# Patient Record
Sex: Male | Born: 1945 | Race: White | Hispanic: No | Smoking: Former smoker
Health system: Southern US, Community
[De-identification: ages and names within clinical notes are randomized; demographics above are authoritative.]

## PROBLEM LIST (undated history)

## (undated) DIAGNOSIS — I10 Essential (primary) hypertension: Secondary | ICD-10-CM

## (undated) DIAGNOSIS — E7800 Pure hypercholesterolemia, unspecified: Secondary | ICD-10-CM

## (undated) DIAGNOSIS — C12 Malignant neoplasm of pyriform sinus: Secondary | ICD-10-CM

## (undated) DIAGNOSIS — Z972 Presence of dental prosthetic device (complete) (partial): Secondary | ICD-10-CM

## (undated) HISTORY — DX: Malignant neoplasm of pyriform sinus: C12

## (undated) HISTORY — PX: COLONOSCOPY: SHX174

---

## 2010-07-22 ENCOUNTER — Emergency Department (HOSPITAL_COMMUNITY)
Admission: EM | Admit: 2010-07-22 | Discharge: 2010-07-22 | Disposition: A | Discharge status: Home / Self Care | Payer: MEDICARE | Attending: Emergency Medicine | Admitting: Emergency Medicine

## 2010-07-22 DIAGNOSIS — S90569A Insect bite (nonvenomous), unspecified ankle, initial encounter: Secondary | ICD-10-CM | POA: Insufficient documentation

## 2010-10-28 ENCOUNTER — Ambulatory Visit: Payer: Self-pay | Admitting: Gastroenterology

## 2010-11-01 LAB — PATHOLOGY REPORT

## 2011-10-02 DIAGNOSIS — I10 Essential (primary) hypertension: Secondary | ICD-10-CM | POA: Insufficient documentation

## 2011-10-02 DIAGNOSIS — E785 Hyperlipidemia, unspecified: Secondary | ICD-10-CM | POA: Insufficient documentation

## 2013-12-26 ENCOUNTER — Emergency Department (HOSPITAL_COMMUNITY)
Admission: EM | Admit: 2013-12-26 | Discharge: 2013-12-26 | Disposition: A | Discharge status: Home / Self Care | Payer: Medicare Other | Attending: Emergency Medicine | Admitting: Emergency Medicine

## 2013-12-26 ENCOUNTER — Emergency Department (HOSPITAL_COMMUNITY): Payer: Medicare Other

## 2013-12-26 ENCOUNTER — Encounter (HOSPITAL_COMMUNITY): Payer: Self-pay | Admitting: Emergency Medicine

## 2013-12-26 DIAGNOSIS — Z87891 Personal history of nicotine dependence: Secondary | ICD-10-CM | POA: Diagnosis not present

## 2013-12-26 DIAGNOSIS — E780 Pure hypercholesterolemia: Secondary | ICD-10-CM | POA: Insufficient documentation

## 2013-12-26 DIAGNOSIS — R001 Bradycardia, unspecified: Secondary | ICD-10-CM | POA: Insufficient documentation

## 2013-12-26 DIAGNOSIS — M19041 Primary osteoarthritis, right hand: Secondary | ICD-10-CM | POA: Diagnosis not present

## 2013-12-26 DIAGNOSIS — I10 Essential (primary) hypertension: Secondary | ICD-10-CM | POA: Insufficient documentation

## 2013-12-26 DIAGNOSIS — Z79899 Other long term (current) drug therapy: Secondary | ICD-10-CM | POA: Diagnosis not present

## 2013-12-26 DIAGNOSIS — M79641 Pain in right hand: Secondary | ICD-10-CM | POA: Diagnosis present

## 2013-12-26 HISTORY — DX: Pure hypercholesterolemia, unspecified: E78.00

## 2013-12-26 HISTORY — DX: Essential (primary) hypertension: I10

## 2013-12-26 MED ORDER — HYDROCODONE-ACETAMINOPHEN 5-325 MG PO TABS: 1.0000 | ORAL_TABLET | ORAL | Status: DC | PRN

## 2013-12-26 MED ORDER — CELECOXIB 100 MG PO CAPS: 100.0000 mg | ORAL_CAPSULE | Freq: Two times a day (BID) | ORAL | Status: DC

## 2013-12-26 NOTE — Discharge Instructions (Signed)
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 There is no fracture, or dislocation. Please apply heat or warm compress to your hand. Please use Celebrex 2 times daily with food. May use Norco for more severe pain if needed. Norco may cause drowsiness, please use with caution. Arthritis, Nonspecific Arthritis is pain, redness, warmth, or puffiness (inflammation) of a joint. The joint may be stiff or hurt when you move it. One or more joints may be affected. There are many types of arthritis. Your doctor may not know what type you have right away. The most common cause of arthritis is wear and tear on the joint (osteoarthritis). HOME CARE   Only take medicine as told by your doctor.  Rest the joint as much as possible.  Raise (elevate) your joint if it is puffy.  Use crutches if the painful joint is in your leg.  Drink enough fluids to keep your pee (urine) clear or pale yellow.  Follow your doctor's diet instructions.  Use cold packs for very bad joint pain for 10 to 15 minutes every hour. Ask your doctor if it is okay for you to use hot packs.  Exercise as told by your doctor.  Take a warm shower if you have stiffness in the morning.  Move your sore joints throughout the day. GET HELP RIGHT AWAY IF:   You have a fever.  You have very bad joint pain, puffiness, or redness.  You have many joints that are painful and puffy.  You are not getting better with treatment.  You have very bad back pain or leg weakness.  You cannot control when you poop (bowel movement) or pee (urinate).  You do not feel better in 24 hours or are getting worse.  You are having side effects from your medicine. MAKE SURE YOU:   Understand these instructions.  Will watch your condition.  Will get help right away if you are not doing well or get worse. Document Released: 05/24/2009 Document Revised: 08/29/2011 Document Reviewed: 05/24/2009 [facility] Patient Information 2015 [place],  [place]. This information is not intended to replace advice given to you by your health care provider. Make sure you discuss any questions you have with your health care provider.

## 2013-12-26 NOTE — ED Notes (Signed)
Rt hand swollen, onset last night. Superficial abrasion to hand,  Has abrasions to forearm also. 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

## 2013-12-26 NOTE — ED Notes (Signed)
Pt reports right hand pain since yesterday. 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 No obvious deformity noted. Radial pulses strong.

## 2013-12-26 NOTE — ED Provider Notes (Signed)
CSN: [id number]     Arrival date & time 12/26/13  1024 History   First MD Initiated Contact with Patient 12/26/13 1147     Chief Complaint  Patient presents with  . Hand Pain     (Consider location/radiation/quality/duration/timing/severity/associated sxs/prior Treatment) HPI Comments: The patient is a 68 year old male who presents to the emergency department with complaint of right hand pain. The patient states that he was working on taking a gas tank off of a car on yesterday and last night he began to notice pain. Today he noticed even more pain. He has pain moving his fingers. The most of the pain is at the fourth finger of the right hand. It is of note that the patient is right-hand dominant. He has not had any fever or chills. He does not remember hitting his hand, but states that moving a gas tank on his car it could have been that he may have hit and not know it. He's not had any previous operations or procedures involving the right hand.  Patient is a 68 y.o. male presenting with hand pain. The history is provided by the patient.  Hand Pain This is a new problem. The current episode started yesterday. Associated symptoms include arthralgias. Pertinent negatives include no abdominal pain, chest pain, coughing or neck pain.    Past Medical History  Diagnosis Date  . Hypertension   . High cholesterol    History reviewed. No pertinent past surgical history. History reviewed. No pertinent family history. History  Substance Use Topics  . Smoking status: Former Research scientist (life sciences)  . Smokeless tobacco: Not on file  . Alcohol Use: No    Review of Systems  Constitutional: Negative for activity change.       All ROS Neg except as noted in HPI  Eyes: Negative for photophobia and discharge.  Respiratory: Negative for cough, shortness of breath and wheezing.   Cardiovascular: Negative for chest pain and palpitations.  Gastrointestinal: Negative for abdominal pain and blood in stool.    Genitourinary: Negative for dysuria, frequency and hematuria.  Musculoskeletal: Positive for arthralgias. Negative for back pain and neck pain.  Skin: Negative.   Neurological: Negative for dizziness, seizures and speech difficulty.  Psychiatric/Behavioral: Negative for hallucinations and confusion.      Allergies  Review of patient's allergies indicates no known allergies.  Home Medications   Prior to Admission medications   Medication Sig Start Date End Date Taking? Authorizing Provider  lisinopril (PRINIVIL,ZESTRIL) 20 MG tablet Take 20 mg by mouth at bedtime. 02/26/12  Yes Historical Provider, MD  pravastatin (PRAVACHOL) 40 MG tablet Take 40 mg by mouth at bedtime.   Yes Historical Provider, MD   BP 172/95  Pulse 59  Temp(Src) 98.2 F (36.8 C) (Oral)  Resp 18  Ht 6' (1.829 m)  Wt 180 lb (81.647 kg)  BMI 24.41 kg/m2  SpO2 100% Physical Exam  Nursing note and vitals reviewed. Constitutional: He is oriented to person, place, and time. He appears well-developed and well-nourished.  Non-toxic appearance.  HENT:  Head: Normocephalic.  Right Ear: Tympanic membrane and external ear normal.  Left Ear: Tympanic membrane and external ear normal.  Eyes: EOM and lids are normal. Pupils are equal, round, and reactive to light.  Neck: Normal range of motion. Neck supple. Carotid bruit is not present.  Cardiovascular: Regular rhythm, normal heart sounds, intact distal pulses and normal pulses.  Bradycardia present.   Pulmonary/Chest: Breath sounds normal. No respiratory distress.  Abdominal: Soft. Bowel sounds are  normal. There is no tenderness. There is no guarding.  Musculoskeletal:       Right hand: He exhibits decreased range of motion, tenderness and swelling. He exhibits normal capillary refill and no deformity. Normal sensation noted.       Hands: Lymphadenopathy:       Head (right side): No submandibular adenopathy present.       Head (left side): No submandibular  adenopathy present.    He has no cervical adenopathy.  Neurological: He is alert and oriented to person, place, and time. He has normal strength. No cranial nerve deficit or sensory deficit.  Skin: Skin is warm and dry.  Psychiatric: He has a normal mood and affect. His speech is normal.    ED Course  Procedures (including critical care time) Labs Review Labs Reviewed - No data to display  Imaging Review Dg Hand Complete Right  12/26/2013   CLINICAL DATA:  Right hand redness and pain. No injury. Initial evaluation .  EXAM: RIGHT HAND - COMPLETE 3+ VIEW  COMPARISON:  None.  FINDINGS: No acute soft tissue or bony abnormality noted. Diffuse degenerative change. No radiopaque foreign body .  IMPRESSION: No acute abnormality.   Electronically Signed   By: Marcello Moores  Register   On: 12/26/2013 12:00     EKG Interpretation None      MDM  There no hot joints noted of the right hand, wrist, or elbow. There is no red streaks or signs of cellulitis. X-ray of the right hand is negative for any acute fracture or dislocation.  Prescription for Celebrex and Norco given to the patient. Patient to follow with primary care physician next week.    Final diagnoses:  None    *I have reviewed nursing notes, vital signs, and all appropriate lab and imaging results for this patient.Lenox Ahr, PA-C 12/26/13 1255

## 2013-12-31 NOTE — ED Provider Notes (Signed)
Medical screening examination/treatment/procedure(s) were performed by non-physician practitioner and as supervising physician I was immediately available for consultation/collaboration.   EKG Interpretation None        Tanna Furry, MD 12/31/13 2327

## 2020-05-10 NOTE — H&P (Signed)
Surgical History & Physical  Patient Name: Travis Arellano DOB: 03-May-1945  Surgery: Cataract extraction with intraocular lens implant phacoemulsification; Right Eye  Surgeon: Baruch Goldmann MD Surgery Date:  05/17/2020 Pre-Op Date:  05/03/2020  HPI: A 27 Yr. old male patient Pt referred by Dr. Jorja Loa for cataract evaluation. The patient complains of difficulty when viewing TV, reading closed caption, news scrolls on TV, which began 2 years ago. Both eyes are affected but OD seems worse than OS. The condition's severity is worsening. Pt does not use any correction. The complaint is associated with glare, bothersome tearing OS and halos. Symptoms are negatively affecting pt's quality of life. The patient experiences no increase in flashes, floater, shadow, curtain or veil. HPI was performed by Baruch Goldmann .  Medical History: Dry Eyes Cataracts High Blood Pressure Hypercholesterolemia  Review of Systems Negative Allergic/Immunologic Negative Cardiovascular Negative Constitutional Negative Ear, Nose, Mouth & Throat Negative Endocrine Negative Eyes Negative Gastrointestinal Negative Genitourinary Negative Hemotologic/Lymphatic Negative Integumentary Negative Musculoskeletal Negative Neurological Negative Psychiatry Negative Respiratory  Social   Never smoked   Medication Systane Complete,  PRAVASTATIN SODIUM, LISINOPRIL, METOPROLOL ER, PRAVASTATIN, DOXAZOSIN MESYLATE, HYDROCHLOROTHIAZIDE,   Sx/Procedures  None  Drug Allergies   NKDA  History & Physical: Heent:  Cataract, Right eye NECK: supple without bruits LUNGS: lungs clear to auscultation CV: regular rate and rhythm Abdomen: soft and non-tender  Impression & Plan: Assessment: 1.  NUCLEAR SCLEROSIS AGE RELATED; Both Eyes (H25.13) 2.  COMBINED FORMS AGE RELATED CATARACT; Left Eye (H25.812) 3.  BLEPHARITIS; Right Upper Lid, Right Lower Lid, Left Upper Lid, Left Lower Lid (H01.001, H01.002,H01.004,H01.005) 4.   Pinguecula; Both Eyes (H11.153) 5.  DERMATOCHALASIS; Right Upper Lid, Left Upper Lid (H02.831, D17.616)  Plan: 1.  Cataract accounts for the patient's decreased vision. This visual impairment is not correctable with a tolerable change in glasses or contact lenses. Cataract surgery with an implantation of a new lens should significantly improve the visual and functional status of the patient. Discussed all risks, benefits, alternatives, and potential complications. Discussed the procedures and recovery. Patient desires to have surgery. A-scan ordered and performed today for intra-ocular lens calculations. The surgery will be performed in order to improve vision for driving, reading, and for eye examinations. Recommend phacoemulsification with intra-ocular lens. Recommend Dextenza for post-operative pain and inflammation. Right Eye first. Dilates well - shugarcaine by protocol. Set for Near : goal -2.00 2.  Significant - will address after OD. 3.  Regular lid cleaning. 4.  Observe; Artificial tears as needed for irritation. 5.  Asymptomatic, recommend observation for now. Findings, prognosis and treatment options reviewed.

## 2020-05-13 ENCOUNTER — Encounter (HOSPITAL_COMMUNITY)
Admission: RE | Admit: 2020-05-13 | Discharge: 2020-05-13 | Disposition: A | Discharge status: Home / Self Care | Payer: Medicare Other | Source: Ambulatory Visit | Attending: Ophthalmology | Admitting: Ophthalmology

## 2020-05-13 ENCOUNTER — Other Ambulatory Visit: Payer: Self-pay

## 2020-05-13 ENCOUNTER — Encounter (HOSPITAL_COMMUNITY): Payer: Self-pay | Admitting: Ophthalmology

## 2020-05-14 ENCOUNTER — Other Ambulatory Visit (HOSPITAL_COMMUNITY)
Admission: RE | Admit: 2020-05-14 | Discharge: 2020-05-14 | Disposition: A | Discharge status: Home / Self Care | Payer: Medicare Other | Source: Ambulatory Visit | Attending: Ophthalmology | Admitting: Ophthalmology

## 2020-05-14 ENCOUNTER — Other Ambulatory Visit: Payer: Self-pay

## 2020-05-14 DIAGNOSIS — Z20822 Contact with and (suspected) exposure to covid-19: Secondary | ICD-10-CM | POA: Insufficient documentation

## 2020-05-14 DIAGNOSIS — Z01812 Encounter for preprocedural laboratory examination: Secondary | ICD-10-CM | POA: Diagnosis present

## 2020-05-15 LAB — SARS CORONAVIRUS 2 (TAT 6-24 HRS): SARS Coronavirus 2: NEGATIVE

## 2020-05-17 ENCOUNTER — Encounter (HOSPITAL_COMMUNITY)
Admission: RE | Disposition: A | Discharge status: Home / Self Care | Payer: Self-pay | Source: Home / Self Care | Attending: Ophthalmology

## 2020-05-17 ENCOUNTER — Ambulatory Visit (HOSPITAL_COMMUNITY)
Admission: RE | Admit: 2020-05-17 | Discharge: 2020-05-17 | Disposition: A | Discharge status: Home / Self Care | Payer: Medicare Other | Attending: Ophthalmology | Admitting: Ophthalmology

## 2020-05-17 ENCOUNTER — Encounter (HOSPITAL_COMMUNITY): Payer: Self-pay | Admitting: Ophthalmology

## 2020-05-17 ENCOUNTER — Ambulatory Visit (HOSPITAL_COMMUNITY): Payer: Medicare Other | Admitting: Anesthesiology

## 2020-05-17 DIAGNOSIS — H02831 Dermatochalasis of right upper eyelid: Secondary | ICD-10-CM | POA: Insufficient documentation

## 2020-05-17 DIAGNOSIS — Z79899 Other long term (current) drug therapy: Secondary | ICD-10-CM | POA: Diagnosis not present

## 2020-05-17 DIAGNOSIS — H11153 Pinguecula, bilateral: Secondary | ICD-10-CM | POA: Insufficient documentation

## 2020-05-17 DIAGNOSIS — H2511 Age-related nuclear cataract, right eye: Secondary | ICD-10-CM | POA: Insufficient documentation

## 2020-05-17 DIAGNOSIS — H0100A Unspecified blepharitis right eye, upper and lower eyelids: Secondary | ICD-10-CM | POA: Insufficient documentation

## 2020-05-17 DIAGNOSIS — H02834 Dermatochalasis of left upper eyelid: Secondary | ICD-10-CM | POA: Insufficient documentation

## 2020-05-17 DIAGNOSIS — H0100B Unspecified blepharitis left eye, upper and lower eyelids: Secondary | ICD-10-CM | POA: Insufficient documentation

## 2020-05-17 HISTORY — PX: CATARACT EXTRACTION W/PHACO: SHX586

## 2020-05-17 SURGERY — PHACOEMULSIFICATION, CATARACT, WITH IOL INSERTION
Anesthesia: Monitor Anesthesia Care | Site: Eye | Laterality: Right

## 2020-05-17 MED ORDER — LIDOCAINE HCL 3.5 % OP GEL
1.0000 "application " | Freq: Once | OPHTHALMIC | Status: AC
  Administered 2020-05-17: 1 via OPHTHALMIC

## 2020-05-17 MED ORDER — POVIDONE-IODINE 5 % OP SOLN
OPHTHALMIC | Status: DC | PRN
  Administered 2020-05-17: 1 via OPHTHALMIC

## 2020-05-17 MED ORDER — LIDOCAINE HCL (PF) 1 % IJ SOLN
INTRAOCULAR | Status: DC | PRN
  Administered 2020-05-17: 1 mL via OPHTHALMIC

## 2020-05-17 MED ORDER — NEOMYCIN-POLYMYXIN-DEXAMETH 3.5-10000-0.1 OP SUSP
OPHTHALMIC | Status: DC | PRN
  Administered 2020-05-17: 1 [drp] via OPHTHALMIC

## 2020-05-17 MED ORDER — PROVISC 10 MG/ML IO SOLN
INTRAOCULAR | Status: DC | PRN
  Administered 2020-05-17: 0.85 mL via INTRAOCULAR

## 2020-05-17 MED ORDER — BSS IO SOLN
INTRAOCULAR | Status: DC | PRN
  Administered 2020-05-17: 15 mL via INTRAOCULAR

## 2020-05-17 MED ORDER — STERILE WATER FOR IRRIGATION IR SOLN
Status: DC | PRN
  Administered 2020-05-17: 250 mL

## 2020-05-17 MED ORDER — TETRACAINE HCL 0.5 % OP SOLN
1.0000 [drp] | OPHTHALMIC | Status: AC | PRN
  Administered 2020-05-17 (×3): 1 [drp] via OPHTHALMIC

## 2020-05-17 MED ORDER — EPINEPHRINE PF 1 MG/ML IJ SOLN
INTRAOCULAR | Status: DC | PRN
  Administered 2020-05-17: 500 mL

## 2020-05-17 MED ORDER — SODIUM HYALURONATE 23 MG/ML IO SOLN
INTRAOCULAR | Status: DC | PRN
  Administered 2020-05-17: 0.6 mL via INTRAOCULAR

## 2020-05-17 MED ORDER — PHENYLEPHRINE HCL 2.5 % OP SOLN
1.0000 [drp] | OPHTHALMIC | Status: AC | PRN
  Administered 2020-05-17 (×3): 1 [drp] via OPHTHALMIC

## 2020-05-17 MED ORDER — TROPICAMIDE 1 % OP SOLN
1.0000 [drp] | OPHTHALMIC | Status: AC
  Administered 2020-05-17 (×3): 1 [drp] via OPHTHALMIC

## 2020-05-17 SURGICAL SUPPLY — 12 items
CLOTH BEACON ORANGE TIMEOUT ST (SAFETY) ×2 IMPLANT
EYE SHIELD UNIVERSAL CLEAR (GAUZE/BANDAGES/DRESSINGS) ×2 IMPLANT
GLOVE SURG UNDER POLY LF SZ6.5 (GLOVE) ×2 IMPLANT
GLOVE SURG UNDER POLY LF SZ7 (GLOVE) ×2 IMPLANT
NEEDLE HYPO 18GX1.5 BLUNT FILL (NEEDLE) ×2 IMPLANT
PAD ARMBOARD 7.5X6 YLW CONV (MISCELLANEOUS) ×2 IMPLANT
SYR TB 1ML LL NO SAFETY (SYRINGE) ×2 IMPLANT
TAPE SURG TRANSPORE 1 IN (GAUZE/BANDAGES/DRESSINGS) ×1 IMPLANT
TAPE SURGICAL TRANSPORE 1 IN (GAUZE/BANDAGES/DRESSINGS) ×2
TECNIS IOL (Intraocular Lens) ×2 IMPLANT
VISCOELASTIC ADDITIONAL (OPHTHALMIC RELATED) IMPLANT
WATER STERILE IRR 250ML POUR (IV SOLUTION) ×2 IMPLANT

## 2020-05-17 NOTE — Anesthesia Procedure Notes (Signed)
Procedure Name: MAC Date/Time: 05/17/2020 2:21 PM Performed by: Orlie Dakin, CRNA Pre-anesthesia Checklist: Patient identified, Emergency Drugs available, Suction available and Patient being monitored Patient Re-evaluated:Patient Re-evaluated prior to induction Oxygen Delivery Method: Nasal cannula Placement Confirmation: positive ETCO2

## 2020-05-17 NOTE — Discharge Instructions (Signed)
Please discharge patient when stable, will follow up today with Dr. Korene Dula at the [facility] [place] office immediately following discharge.  Leave shield in place until visit.  All paperwork with discharge instructions will be given at the office.  [name] Address:  [address]  [place], [place] [address]  

## 2020-05-17 NOTE — Op Note (Signed)
Date of procedure: 05/17/20  Pre-operative diagnosis: Visually significant age-related nuclear cataract, Right Eye (H25.11)  Post-operative diagnosis: Visually significant age-related nuclear cataract, Right Eye  Procedure: Removal of cataract via phacoemulsification and insertion of intra-ocular lens [employer] DCB00  +23.5D into the capsular bag of the Right Eye  Attending surgeon: Raoul A. Wrzosek, MD, MA  Anesthesia: MAC, Topical Akten  Complications: None  Estimated Blood Loss: <1mL (minimal)  Specimens: None  Implants: As above  Indications:  Visually significant age-related cataract, Right Eye  Procedure:  The patient was seen and identified in the pre-operative area. The operative eye was identified and dilated.  The operative eye was marked.  Topical anesthesia was administered to the operative eye.     The patient was then to the operative suite and placed in the supine position.  A timeout was performed confirming the patient, procedure to be performed, and all other relevant information.   The patient's face was prepped and draped in the usual fashion for intra-ocular surgery.  A lid speculum was placed into the operative eye and the surgical microscope moved into place and focused.  A superotemporal paracentesis was created using a 20 gauge paracentesis blade.  Shugarcaine was injected into the anterior chamber.  Viscoelastic was injected into the anterior chamber.  A temporal clear-corneal main wound incision was created using a 2.4mm microkeratome.  A continuous curvilinear capsulorrhexis was initiated using an irrigating cystitome and completed using capsulorrhexis forceps.  Hydrodissection and hydrodeliniation were performed.  Viscoelastic was injected into the anterior chamber.  A phacoemulsification handpiece and a chopper as a second instrument were used to remove the nucleus and epinucleus. The irrigation/aspiration handpiece was used to remove any  remaining cortical material.   The capsular bag was reinflated with viscoelastic, checked, and found to be intact.  The intraocular lens was inserted into the capsular bag.  The irrigation/aspiration handpiece was used to remove any remaining viscoelastic.  The clear corneal wound and paracentesis wounds were then hydrated and checked with Weck-Cels to be watertight.  The lid-speculum and drape was removed, and the patient's face was cleaned with a wet and dry 4x4.  Maxitrol was instilled in the eye before a clear shield was taped over the eye. The patient was taken to the post-operative care unit in good condition, having tolerated the procedure well.  Post-Op Instructions: The patient will follow up at [facility] for a same day post-operative evaluation and will receive all other orders and instructions.  

## 2020-05-17 NOTE — Anesthesia Preprocedure Evaluation (Addendum)
Anesthesia Evaluation  Patient identified by MRN, date of birth, ID band Patient awake    Reviewed: Allergy & Precautions, NPO status , Patient's Chart, lab work & pertinent test results  History of Anesthesia Complications Negative for: history of anesthetic complications  Airway Mallampati: III  TM Distance: >3 FB Neck ROM: Full    Dental  (+) Dental Advisory Given, Upper Dentures, Partial Lower   Pulmonary former smoker,    Pulmonary exam normal breath sounds clear to auscultation       Cardiovascular Exercise Tolerance: Good hypertension, Pt. on medications Normal cardiovascular exam Rhythm:Regular Rate:Normal     Neuro/Psych negative psych ROS   GI/Hepatic negative GI ROS, Neg liver ROS,   Endo/Other  negative endocrine ROS  Renal/GU negative Renal ROS     Musculoskeletal negative musculoskeletal ROS (+)   Abdominal   Peds  Hematology negative hematology ROS (+)   Anesthesia Other Findings   Reproductive/Obstetrics negative OB ROS                             Anesthesia Physical  Anesthesia Plan  ASA: II  Anesthesia Plan: MAC   Post-op Pain Management:    Induction:   PONV Risk Score and Plan:   Airway Management Planned: Nasal Cannula and Natural Airway  Additional Equipment:   Intra-op Plan:   Post-operative Plan:   Informed Consent: I have reviewed the patients History and Physical, chart, labs and discussed the procedure including the risks, benefits and alternatives for the proposed anesthesia with the patient or authorized representative who has indicated his/her understanding and acceptance.     Dental advisory given  Plan Discussed with: CRNA and Surgeon  Anesthesia Plan Comments:         Anesthesia Quick Evaluation  

## 2020-05-17 NOTE — Transfer of Care (Signed)
Immediate Anesthesia Transfer of Care Note  Patient: Travis Arellano  Procedure(s) Performed: CATARACT EXTRACTION PHACO AND INTRAOCULAR LENS PLACEMENT (IOC) (Right Eye)  Patient Location: Short Stay  Anesthesia Type:MAC  Level of Consciousness: awake, alert  and oriented  Airway & Oxygen Therapy: Patient Spontanous Breathing  Post-op Assessment: Report given to RN and Post -op Vital signs reviewed and stable  Post vital signs: Reviewed and stable  Last Vitals:  Vitals Value Taken Time  BP    Temp    Pulse    Resp    SpO2      Last Pain:  Vitals:   05/17/20 1311  TempSrc: Oral  PainSc: 0-No pain      Patients Stated Pain Goal: 4 (76/80/88 1103)  Complications: No complications documented.

## 2020-05-17 NOTE — Interval H&P Note (Signed)
History and Physical Interval Note:  05/17/2020 2:14 PM  Travis Arellano  has presented today for surgery, with the diagnosis of Nuclear sclerotic cataract - Right eye.  The various methods of treatment have been discussed with the patient and family. After consideration of risks, benefits and other options for treatment, the patient has consented to  Procedure(s) with comments: CATARACT EXTRACTION PHACO AND INTRAOCULAR LENS PLACEMENT (IOC) (Right) - CDE:  as a surgical intervention.  The patient's history has been reviewed, patient examined, no change in status, stable for surgery.  I have reviewed the patient's chart and labs.  Questions were answered to the patient's satisfaction.     Baruch Goldmann

## 2020-05-17 NOTE — Anesthesia Postprocedure Evaluation (Signed)
Anesthesia Post Note  Patient: Travis Arellano  Procedure(s) Performed: CATARACT EXTRACTION PHACO AND INTRAOCULAR LENS PLACEMENT (IOC) (Right Eye)  Patient location during evaluation: Phase II Anesthesia Type: MAC Level of consciousness: awake and alert and oriented Pain management: pain level controlled Vital Signs Assessment: post-procedure vital signs reviewed and stable Respiratory status: spontaneous breathing, nonlabored ventilation and respiratory function stable Cardiovascular status: blood pressure returned to baseline and stable Postop Assessment: no apparent nausea or vomiting Anesthetic complications: no   No complications documented.   Last Vitals:  Vitals:   05/17/20 1311 05/17/20 1435  BP: 135/75 135/81  Pulse: (!) 54 (!) 54  Resp: 18 18  Temp: 36.6 C 36.6 C  SpO2: 100% 97%    Last Pain:  Vitals:   05/17/20 1435  TempSrc: Oral  PainSc: 0-No pain                 Orlie Dakin

## 2020-05-18 ENCOUNTER — Encounter (HOSPITAL_COMMUNITY): Payer: Self-pay | Admitting: Ophthalmology

## 2020-06-04 NOTE — H&P (Signed)
Surgical History & Physical  Patient Name: Travis Arellano DOB: August 19, 1945  Surgery: Cataract extraction with intraocular lens implant phacoemulsification; Left Eye  Surgeon: Baruch Goldmann MD Surgery Date:  06/11/2020 Pre-Op Date:  05/31/2020  HPI: A 36 Yr. old male patient 1. The patient complains of difficulty when viewing TV, reading closed caption, news scrolls on TV, which began 2 years ago. The left eye is affected. The episode is gradual. The condition's severity increased since last visit. Symptoms occur when the patient is inside and outside. This is negatively affecting his quality of life. 2. The patient is returning after cataract post-op. The right eye is affected. Status post cataract post-op, which began 1 year ago: Since the last visit, the affected area is doing well. The patient's vision is improved. Patient is following medication instructions. HPI was performed by Baruch Goldmann .  Medical History: Dry Eyes Cataracts High Blood Pressure Hypercholesterolemia  Review of Systems Negative Allergic/Immunologic Negative Cardiovascular Negative Constitutional Negative Ear, Nose, Mouth & Throat Negative Endocrine Negative Eyes Negative Gastrointestinal Negative Genitourinary Negative Hemotologic/Lymphatic Negative Integumentary Negative Musculoskeletal Negative Neurological Negative Psychiatry Negative Respiratory  Social   Never smoked  Medication Systane Complete, Ilevro, Moxifloxacin, Prednisolone acetate 1%,  PRAVASTATIN SODIUM, LISINOPRIL, METOPROLOL ER, PRAVASTATIN, DOXAZOSIN MESYLATE, HYDROCHLOROTHIAZIDE,   Sx/Procedures Phaco c IOL OD,   Drug Allergies   NKDA  History & Physical: Heent:  Cataract, Left eye NECK: supple without bruits LUNGS: lungs clear to auscultation CV: regular rate and rhythm Abdomen: soft and non-tender  Impression & Plan: Assessment: 1.  CATARACT EXTRACTION STATUS; Right Eye (Z98.41) 2.  INTRAOCULAR LENS IOL  (Z96.1) 3.  COMBINED FORMS AGE RELATED CATARACT; Left Eye (H25.812)  Plan: 1.  1 week after cataract surgery. Doing well with improved vision and normal eye pressure. Call with any problems or concerns. Stop Vigamox. Continue Ilevro 1 drop 1x/day for 3 more weeks. Continue Pred Acetate 1 drop 2x/day for 3 more weeks. 2.  Doing well since surgery Continue Post-op medications 3.  Cataract accounts for the patient's decreased vision. This visual impairment is not correctable with a tolerable change in glasses or contact lenses. Cataract surgery with an implantation of a new lens should significantly improve the visual and functional status of the patient. Discussed all risks, benefits, alternatives, and potential complications. Discussed the procedures and recovery. Patient desires to have surgery. A-scan ordered and performed today for intra-ocular lens calculations. The surgery will be performed in order to improve vision for driving, reading, and for eye examinations. Recommend phacoemulsification with intra-ocular lens. Recommend Dextenza for post-operative pain and inflammation. Left Eye. Surgery required to correct imbalance of vision. Dilates well - shugarcaine by protocol.

## 2020-06-08 ENCOUNTER — Other Ambulatory Visit: Payer: Self-pay

## 2020-06-08 ENCOUNTER — Encounter (HOSPITAL_COMMUNITY)
Admission: RE | Admit: 2020-06-08 | Discharge: 2020-06-08 | Disposition: A | Discharge status: Home / Self Care | Payer: Medicare Other | Source: Ambulatory Visit | Attending: Ophthalmology | Admitting: Ophthalmology

## 2020-06-08 ENCOUNTER — Encounter (HOSPITAL_COMMUNITY): Payer: Self-pay

## 2020-06-09 ENCOUNTER — Other Ambulatory Visit: Payer: Self-pay

## 2020-06-09 ENCOUNTER — Other Ambulatory Visit (HOSPITAL_COMMUNITY)
Admission: RE | Admit: 2020-06-09 | Discharge: 2020-06-09 | Disposition: A | Discharge status: Home / Self Care | Payer: Medicare Other | Source: Ambulatory Visit | Attending: Ophthalmology | Admitting: Ophthalmology

## 2020-06-09 DIAGNOSIS — Z01812 Encounter for preprocedural laboratory examination: Secondary | ICD-10-CM | POA: Diagnosis present

## 2020-06-09 DIAGNOSIS — Z20822 Contact with and (suspected) exposure to covid-19: Secondary | ICD-10-CM | POA: Diagnosis not present

## 2020-06-09 LAB — SARS CORONAVIRUS 2 (TAT 6-24 HRS): SARS Coronavirus 2: NEGATIVE

## 2020-06-11 ENCOUNTER — Encounter (HOSPITAL_COMMUNITY)
Admission: RE | Disposition: A | Discharge status: Home / Self Care | Payer: Self-pay | Source: Home / Self Care | Attending: Ophthalmology

## 2020-06-11 ENCOUNTER — Ambulatory Visit (HOSPITAL_COMMUNITY)
Admission: RE | Admit: 2020-06-11 | Discharge: 2020-06-11 | Disposition: A | Discharge status: Home / Self Care | Payer: Medicare Other | Attending: Ophthalmology | Admitting: Ophthalmology

## 2020-06-11 ENCOUNTER — Ambulatory Visit (HOSPITAL_COMMUNITY): Payer: Medicare Other | Admitting: Anesthesiology

## 2020-06-11 DIAGNOSIS — Z9841 Cataract extraction status, right eye: Secondary | ICD-10-CM | POA: Diagnosis not present

## 2020-06-11 DIAGNOSIS — Z961 Presence of intraocular lens: Secondary | ICD-10-CM | POA: Diagnosis not present

## 2020-06-11 DIAGNOSIS — H25812 Combined forms of age-related cataract, left eye: Secondary | ICD-10-CM | POA: Diagnosis present

## 2020-06-11 DIAGNOSIS — Z87891 Personal history of nicotine dependence: Secondary | ICD-10-CM | POA: Diagnosis not present

## 2020-06-11 DIAGNOSIS — Z79899 Other long term (current) drug therapy: Secondary | ICD-10-CM | POA: Diagnosis not present

## 2020-06-11 HISTORY — PX: CATARACT EXTRACTION W/PHACO: SHX586

## 2020-06-11 SURGERY — PHACOEMULSIFICATION, CATARACT, WITH IOL INSERTION
Anesthesia: Monitor Anesthesia Care | Site: Eye | Laterality: Left

## 2020-06-11 MED ORDER — NEOMYCIN-POLYMYXIN-DEXAMETH 3.5-10000-0.1 OP SUSP
OPHTHALMIC | Status: DC | PRN
  Administered 2020-06-11: 1 [drp] via OPHTHALMIC

## 2020-06-11 MED ORDER — LIDOCAINE HCL (PF) 1 % IJ SOLN
INTRAOCULAR | Status: DC | PRN
  Administered 2020-06-11: 1 mL via OPHTHALMIC

## 2020-06-11 MED ORDER — PHENYLEPHRINE HCL 2.5 % OP SOLN
1.0000 [drp] | OPHTHALMIC | Status: AC | PRN
  Administered 2020-06-11 (×3): 1 [drp] via OPHTHALMIC

## 2020-06-11 MED ORDER — PROVISC 10 MG/ML IO SOLN
INTRAOCULAR | Status: DC | PRN
  Administered 2020-06-11: 0.85 mL via INTRAOCULAR

## 2020-06-11 MED ORDER — EPINEPHRINE PF 1 MG/ML IJ SOLN
INTRAMUSCULAR | Status: AC
  Filled 2020-06-11: qty 2

## 2020-06-11 MED ORDER — POVIDONE-IODINE 5 % OP SOLN
OPHTHALMIC | Status: DC | PRN
  Administered 2020-06-11: 1 via OPHTHALMIC

## 2020-06-11 MED ORDER — EPINEPHRINE PF 1 MG/ML IJ SOLN
INTRAOCULAR | Status: DC | PRN
  Administered 2020-06-11: 500 mL

## 2020-06-11 MED ORDER — TROPICAMIDE 1 % OP SOLN
1.0000 [drp] | OPHTHALMIC | Status: AC
Start: 2020-06-11 — End: 2020-06-11
  Administered 2020-06-11 (×3): 1 [drp] via OPHTHALMIC

## 2020-06-11 MED ORDER — SODIUM HYALURONATE 23 MG/ML IO SOLN
INTRAOCULAR | Status: DC | PRN
  Administered 2020-06-11: 0.6 mL via INTRAOCULAR

## 2020-06-11 MED ORDER — STERILE WATER FOR IRRIGATION IR SOLN
Status: DC | PRN
  Administered 2020-06-11: 500 mL

## 2020-06-11 MED ORDER — TETRACAINE HCL 0.5 % OP SOLN
1.0000 [drp] | OPHTHALMIC | Status: AC | PRN
  Administered 2020-06-11 (×3): 1 [drp] via OPHTHALMIC

## 2020-06-11 MED ORDER — BSS IO SOLN
INTRAOCULAR | Status: DC | PRN
  Administered 2020-06-11: 15 mL via INTRAOCULAR

## 2020-06-11 MED ORDER — LIDOCAINE HCL 3.5 % OP GEL
1.0000 "application " | Freq: Once | OPHTHALMIC | Status: AC
  Administered 2020-06-11: 1 via OPHTHALMIC

## 2020-06-11 SURGICAL SUPPLY — 12 items
CLOTH BEACON ORANGE TIMEOUT ST (SAFETY) ×2 IMPLANT
EYE SHIELD UNIVERSAL CLEAR (GAUZE/BANDAGES/DRESSINGS) ×2 IMPLANT
GLOVE SURG UNDER POLY LF SZ6.5 (GLOVE) ×2 IMPLANT
GLOVE SURG UNDER POLY LF SZ7 (GLOVE) ×2 IMPLANT
NEEDLE HYPO 18GX1.5 BLUNT FILL (NEEDLE) ×2 IMPLANT
PAD ARMBOARD 7.5X6 YLW CONV (MISCELLANEOUS) ×2 IMPLANT
SYR TB 1ML LL NO SAFETY (SYRINGE) ×2 IMPLANT
TAPE SURG TRANSPORE 1 IN (GAUZE/BANDAGES/DRESSINGS) ×1 IMPLANT
TAPE SURGICAL TRANSPORE 1 IN (GAUZE/BANDAGES/DRESSINGS) ×2
TECNIS IOL (Intraocular Lens) ×2 IMPLANT
VISCOELASTIC ADDITIONAL (OPHTHALMIC RELATED) IMPLANT
WATER STERILE IRR 250ML POUR (IV SOLUTION) ×2 IMPLANT

## 2020-06-11 NOTE — Interval H&P Note (Signed)
History and Physical Interval Note:  06/11/2020 1:25 PM  Lenda Kelp  has presented today for surgery, with the diagnosis of Nuclear sclerotic cataract - Left eye.  The various methods of treatment have been discussed with the patient and family. After consideration of risks, benefits and other options for treatment, the patient has consented to  Procedure(s) with comments: CATARACT EXTRACTION PHACO AND INTRAOCULAR LENS PLACEMENT (Alpha) (Left) - left as a surgical intervention.  The patient's history has been reviewed, patient examined, no change in status, stable for surgery.  I have reviewed the patient's chart and labs.  Questions were answered to the patient's satisfaction.     Baruch Goldmann

## 2020-06-11 NOTE — Transfer of Care (Signed)
Immediate Anesthesia Transfer of Care Note  Patient: Travis Arellano  Procedure(s) Performed: CATARACT EXTRACTION PHACO AND INTRAOCULAR LENS PLACEMENT (IOC) (Left Eye)  Patient Location: Short Stay  Anesthesia Type:MAC  Level of Consciousness: awake, alert  and oriented  Airway & Oxygen Therapy: Patient Spontanous Breathing  Post-op Assessment: Report given to RN and Post -op Vital signs reviewed and stable  Post vital signs: Reviewed and stable  Last Vitals:  Vitals Value Taken Time  BP    Temp    Pulse    Resp    SpO2      Last Pain:  Vitals:   06/11/20 1315  PainSc: 0-No pain         Complications: No complications documented.

## 2020-06-11 NOTE — Op Note (Signed)
Date of procedure: 06/11/20  Pre-operative diagnosis: Visually significant age-related combined cataract, Left Eye (H25.812)  Post-operative diagnosis: Visually significant age-related combined cataract, Left Eye (H25.812)  Procedure: Removal of cataract via phacoemulsification and insertion of intra-ocular lens [place]  +21.0D into the capsular bag of the Left Eye  Attending surgeon: Gerda Diss. Kanan Sobek, MD, MA  Anesthesia: MAC, Topical [place]  Complications: None  Estimated Blood Loss: <31m (minimal)  Specimens: None  Implants: As above  Indications:  Visually significant age-related cataract, Left Eye  Procedure:  The patient was seen and identified in the pre-operative area. The operative eye was identified and dilated.  The operative eye was marked.  Topical anesthesia was administered to the operative eye.     The patient was then to the operative suite and placed in the supine position.  A timeout was performed confirming the patient, procedure to be performed, and all other relevant information.   The patient's face was prepped and draped in the usual fashion for intra-ocular surgery.  A lid speculum was placed into the operative eye and the surgical microscope moved into place and focused.  An inferotemporal paracentesis was created using a 20 gauge paracentesis blade.  Shugarcaine was injected into the anterior chamber.  Viscoelastic was injected into the anterior chamber.  A temporal clear-corneal main wound incision was created using a 2.476mmicrokeratome.  A continuous curvilinear capsulorrhexis was initiated using an irrigating cystitome and completed using capsulorrhexis forceps.  Hydrodissection and hydrodeliniation were performed.  Viscoelastic was injected into the anterior chamber.  A phacoemulsification handpiece and a chopper as a second instrument were used to remove the nucleus and epinucleus. The irrigation/aspiration handpiece was used to remove  any remaining cortical material.   The capsular bag was reinflated with viscoelastic, checked, and found to be intact.  The intraocular lens was inserted into the capsular bag.  The irrigation/aspiration handpiece was used to remove any remaining viscoelastic.  The clear corneal wound and paracentesis wounds were then hydrated and checked with Weck-Cels to be watertight.  The lid-speculum was removed.  The drape was removed.  The patient's face was cleaned with a wet and dry 4x4.   Maxitrol was instilled in the eye. A clear shield was taped over the eye. The patient was taken to the post-operative care unit in good condition, having tolerated the procedure well.  Post-Op Instructions: [facility] and will receive all other orders and instructions.

## 2020-06-11 NOTE — Anesthesia Procedure Notes (Signed)
Procedure Name: MAC Date/Time: 06/11/2020 1:31 PM Performed by: Orlie Dakin, CRNA Pre-anesthesia Checklist: Patient identified, Emergency Drugs available, Suction available and Patient being monitored Patient Re-evaluated:Patient Re-evaluated prior to induction Oxygen Delivery Method: Nasal cannula Placement Confirmation: positive ETCO2

## 2020-06-11 NOTE — Anesthesia Postprocedure Evaluation (Signed)
Anesthesia Post Note  Patient: Travis Arellano  Procedure(s) Performed: CATARACT EXTRACTION PHACO AND INTRAOCULAR LENS PLACEMENT (IOC) (Left Eye)  Patient location during evaluation: Phase II Anesthesia Type: MAC Level of consciousness: awake and alert and oriented Pain management: pain level controlled Vital Signs Assessment: post-procedure vital signs reviewed and stable Respiratory status: spontaneous breathing and respiratory function stable Cardiovascular status: blood pressure returned to baseline and stable Postop Assessment: no apparent nausea or vomiting Anesthetic complications: no   No complications documented.   Last Vitals:  Vitals:   06/11/20 1315 06/11/20 1353  BP: (!) 149/83 (!) 143/90  Pulse: (!) 53 (!) 57  Resp: 13 18  Temp: 36.4 C 36.7 C  SpO2: 99% 100%    Last Pain:  Vitals:   06/11/20 1353  TempSrc: Oral  PainSc: 0-No pain                 Travis Arellano

## 2020-06-11 NOTE — Anesthesia Preprocedure Evaluation (Signed)
Anesthesia Evaluation  Patient identified by MRN, date of birth, ID band Patient awake    Reviewed: Allergy & Precautions, NPO status , Patient's Chart, lab work & pertinent test results  History of Anesthesia Complications Negative for: history of anesthetic complications  Airway Mallampati: III  TM Distance: >3 FB Neck ROM: Full    Dental  (+) Dental Advisory Given, Upper Dentures, Partial Lower   Pulmonary former smoker,    Pulmonary exam normal breath sounds clear to auscultation       Cardiovascular Exercise Tolerance: Good hypertension, Pt. on medications Normal cardiovascular exam Rhythm:Regular Rate:Normal     Neuro/Psych negative psych ROS   GI/Hepatic negative GI ROS, Neg liver ROS,   Endo/Other  negative endocrine ROS  Renal/GU negative Renal ROS     Musculoskeletal negative musculoskeletal ROS (+)   Abdominal   Peds  Hematology negative hematology ROS (+)   Anesthesia Other Findings   Reproductive/Obstetrics negative OB ROS                             Anesthesia Physical  Anesthesia Plan  ASA: II  Anesthesia Plan: MAC   Post-op Pain Management:    Induction:   PONV Risk Score and Plan:   Airway Management Planned: Nasal Cannula and Natural Airway  Additional Equipment:   Intra-op Plan:   Post-operative Plan:   Informed Consent: I have reviewed the patients History and Physical, chart, labs and discussed the procedure including the risks, benefits and alternatives for the proposed anesthesia with the patient or authorized representative who has indicated his/her understanding and acceptance.     Dental advisory given  Plan Discussed with: CRNA and Surgeon  Anesthesia Plan Comments:         Anesthesia Quick Evaluation

## 2020-06-11 NOTE — Discharge Instructions (Signed)
Please discharge patient when stable, will follow up today with Dr. Ysabelle Goodroe at the [facility] office immediately following discharge.  Leave shield in place until visit.  All paperwork with discharge instructions will be given at the office.  [facility] Address:  [address]  [place], [place] [address]  

## 2020-06-14 ENCOUNTER — Encounter (HOSPITAL_COMMUNITY): Payer: Self-pay | Admitting: Ophthalmology

## 2021-03-15 ENCOUNTER — Encounter: Payer: Self-pay | Admitting: Otolaryngology

## 2021-03-16 ENCOUNTER — Other Ambulatory Visit: Payer: Self-pay | Admitting: Otolaryngology

## 2021-03-16 DIAGNOSIS — D3705 Neoplasm of uncertain behavior of pharynx: Secondary | ICD-10-CM

## 2021-03-18 ENCOUNTER — Ambulatory Visit
Admission: RE | Admit: 2021-03-18 | Discharge: 2021-03-18 | Disposition: A | Discharge status: Home / Self Care | Payer: Medicare Other | Source: Ambulatory Visit | Attending: Otolaryngology | Admitting: Otolaryngology

## 2021-03-18 DIAGNOSIS — D3705 Neoplasm of uncertain behavior of pharynx: Secondary | ICD-10-CM | POA: Insufficient documentation

## 2021-03-18 LAB — POCT I-STAT CREATININE: Creatinine, Ser: 1.1 mg/dL (ref 0.61–1.24)

## 2021-03-18 MED ORDER — IOHEXOL 300 MG/ML  SOLN
75.0000 mL | Freq: Once | INTRAMUSCULAR | Status: AC | PRN
  Administered 2021-03-18: 75 mL via INTRAVENOUS

## 2021-03-22 ENCOUNTER — Other Ambulatory Visit: Payer: Self-pay

## 2021-03-22 ENCOUNTER — Encounter
Admission: RE | Disposition: A | Discharge status: Home / Self Care | Payer: Self-pay | Source: Home / Self Care | Attending: Otolaryngology

## 2021-03-22 ENCOUNTER — Ambulatory Visit: Payer: Medicare Other | Admitting: Anesthesiology

## 2021-03-22 ENCOUNTER — Encounter: Payer: Self-pay | Admitting: Otolaryngology

## 2021-03-22 ENCOUNTER — Ambulatory Visit
Admission: RE | Admit: 2021-03-22 | Discharge: 2021-03-22 | Disposition: A | Discharge status: Home / Self Care | Payer: Medicare Other | Attending: Otolaryngology | Admitting: Otolaryngology

## 2021-03-22 DIAGNOSIS — C12 Malignant neoplasm of pyriform sinus: Secondary | ICD-10-CM | POA: Diagnosis not present

## 2021-03-22 DIAGNOSIS — Z87891 Personal history of nicotine dependence: Secondary | ICD-10-CM | POA: Insufficient documentation

## 2021-03-22 DIAGNOSIS — R1310 Dysphagia, unspecified: Secondary | ICD-10-CM | POA: Diagnosis present

## 2021-03-22 HISTORY — DX: Presence of dental prosthetic device (complete) (partial): Z97.2

## 2021-03-22 HISTORY — PX: MICROLARYNGOSCOPY: SHX5208

## 2021-03-22 HISTORY — PX: RIGID ESOPHAGOSCOPY: SHX5226

## 2021-03-22 SURGERY — MICROLARYNGOSCOPY
Anesthesia: General | Site: Throat

## 2021-03-22 MED ORDER — FENTANYL CITRATE (PF) 100 MCG/2ML IJ SOLN
INTRAMUSCULAR | Status: DC | PRN
  Administered 2021-03-22 (×2): 50 ug via INTRAVENOUS

## 2021-03-22 MED ORDER — ACETAMINOPHEN 10 MG/ML IV SOLN
1000.0000 mg | Freq: Once | INTRAVENOUS | Status: AC
Start: 2021-03-22 — End: 2021-03-22
  Administered 2021-03-22: 1000 mg via INTRAVENOUS

## 2021-03-22 MED ORDER — MIDAZOLAM HCL 5 MG/5ML IJ SOLN
INTRAMUSCULAR | Status: DC | PRN
Start: 2021-03-22 — End: 2021-03-22
  Administered 2021-03-22: 1 mg via INTRAVENOUS

## 2021-03-22 MED ORDER — LIDOCAINE HCL (CARDIAC) PF 100 MG/5ML IV SOSY
PREFILLED_SYRINGE | INTRAVENOUS | Status: DC | PRN
  Administered 2021-03-22: 50 mg via INTRAVENOUS

## 2021-03-22 MED ORDER — ACETAMINOPHEN 325 MG PO TABS: 325.0000 mg | ORAL_TABLET | ORAL | Status: DC | PRN

## 2021-03-22 MED ORDER — ACETAMINOPHEN 160 MG/5ML PO SOLN: 325.0000 mg | ORAL | Status: DC | PRN

## 2021-03-22 MED ORDER — HYDROCODONE-ACETAMINOPHEN 7.5-325 MG/15ML PO SOLN: ORAL | 0 refills | Status: DC

## 2021-03-22 MED ORDER — FENTANYL CITRATE PF 50 MCG/ML IJ SOSY: 25.0000 ug | PREFILLED_SYRINGE | INTRAMUSCULAR | Status: DC | PRN

## 2021-03-22 MED ORDER — OXYCODONE HCL 5 MG PO TABS: 5.0000 mg | ORAL_TABLET | Freq: Once | ORAL | Status: DC | PRN

## 2021-03-22 MED ORDER — GLYCOPYRROLATE 0.2 MG/ML IJ SOLN
INTRAMUSCULAR | Status: DC | PRN
  Administered 2021-03-22: .1 mg via INTRAVENOUS

## 2021-03-22 MED ORDER — SUCCINYLCHOLINE CHLORIDE 200 MG/10ML IV SOSY
PREFILLED_SYRINGE | INTRAVENOUS | Status: DC | PRN
  Administered 2021-03-22: 100 mg via INTRAVENOUS

## 2021-03-22 MED ORDER — OXYCODONE HCL 5 MG/5ML PO SOLN: 5.0000 mg | Freq: Once | ORAL | Status: DC | PRN

## 2021-03-22 MED ORDER — ONDANSETRON HCL 4 MG/2ML IJ SOLN
INTRAMUSCULAR | Status: DC | PRN
  Administered 2021-03-22: 4 mg via INTRAVENOUS

## 2021-03-22 MED ORDER — LACTATED RINGERS IV SOLN: INTRAVENOUS | Status: DC

## 2021-03-22 MED ORDER — DEXAMETHASONE SODIUM PHOSPHATE 4 MG/ML IJ SOLN
INTRAMUSCULAR | Status: DC | PRN
  Administered 2021-03-22: 8 mg via INTRAVENOUS

## 2021-03-22 MED ORDER — PROPOFOL 10 MG/ML IV BOLUS
INTRAVENOUS | Status: DC | PRN
  Administered 2021-03-22: 50 mg via INTRAVENOUS
  Administered 2021-03-22: 140 mg via INTRAVENOUS
  Administered 2021-03-22: 30 mg via INTRAVENOUS

## 2021-03-22 SURGICAL SUPPLY — 19 items
BLOCK BITE GUARD (MISCELLANEOUS) ×3 IMPLANT
COVER MAYO STAND STRL (DRAPES) ×3 IMPLANT
DRSG TELFA 4X3 1S NADH ST (GAUZE/BANDAGES/DRESSINGS) ×3 IMPLANT
GLOVE SURG ENC MOIS LTX SZ7.5 (GLOVE) ×3 IMPLANT
GOWN STRL REUS W/ TWL LRG LVL3 (GOWN DISPOSABLE) ×2 IMPLANT
GOWN STRL REUS W/TWL LRG LVL3 (GOWN DISPOSABLE) ×3
KIT TURNOVER KIT A (KITS) ×3 IMPLANT
MARKER SKIN DUAL TIP RULER LAB (MISCELLANEOUS) ×3 IMPLANT
NDL HYPO 25GX1X1/2 BEV (NEEDLE) IMPLANT
NDL SAFETY ECLIPSE 18X1.5 (NEEDLE) ×2 IMPLANT
NEEDLE HYPO 18GX1.5 SHARP (NEEDLE) ×3
NEEDLE HYPO 25GX1X1/2 BEV (NEEDLE) IMPLANT
PATTIES SURGICAL .5 X.5 (GAUZE/BANDAGES/DRESSINGS) ×3 IMPLANT
SOL ANTI-FOG 6CC FOG-OUT (MISCELLANEOUS) ×2 IMPLANT
SOL FOG-OUT ANTI-FOG 6CC (MISCELLANEOUS) ×1
SPONGE XRAY 4X4 16PLY STRL (MISCELLANEOUS) ×3 IMPLANT
SYR 3ML LL SCALE MARK (SYRINGE) IMPLANT
TOWEL OR 17X26 4PK STRL BLUE (TOWEL DISPOSABLE) ×3 IMPLANT
TUBING CONNECTING 10 (TUBING) ×3 IMPLANT

## 2021-03-22 NOTE — Transfer of Care (Signed)
Immediate Anesthesia Transfer of Care Note  Patient: Travis Arellano  Procedure(s) Performed: MICROSCOPIC DIRECT LARYNGOSCOPY WITH BIOPSY POSSIBLE RIGID ESOPHAGOSCOPY  Patient Location: PACU  Anesthesia Type: General  Level of Consciousness: awake, alert  and patient cooperative  Airway and Oxygen Therapy: Patient Spontanous Breathing and Patient connected to supplemental oxygen  Post-op Assessment: Post-op Vital signs reviewed, Patient's Cardiovascular Status Stable, Respiratory Function Stable, Patent Airway and No signs of Nausea or vomiting  Post-op Vital Signs: Reviewed and stable  Complications: No notable events documented.

## 2021-03-22 NOTE — Anesthesia Procedure Notes (Signed)
Procedure Name: Intubation Date/Time: 03/22/2021 10:31 AM Performed by: Mayme Genta, CRNA Pre-anesthesia Checklist: Patient identified, Emergency Drugs available, Suction available, Patient being monitored and Timeout performed Patient Re-evaluated:Patient Re-evaluated prior to induction Oxygen Delivery Method: Circle system utilized Preoxygenation: Pre-oxygenation with 100% oxygen Induction Type: IV induction Ventilation: Mask ventilation without difficulty Laryngoscope Size: Miller and 3 Grade View: Grade I Tube type: MLT Tube size: 6.0 mm Number of attempts: 1 Placement Confirmation: ETT inserted through vocal cords under direct vision, positive ETCO2 and breath sounds checked- equal and bilateral Secured at: 23 cm Tube secured with: Tape Dental Injury: Teeth and Oropharynx as per pre-operative assessment

## 2021-03-22 NOTE — Anesthesia Postprocedure Evaluation (Signed)
Anesthesia Post Note  Patient: Travis Arellano  Procedure(s) Performed: MICROSCOPIC DIRECT LARYNGOSCOPY WITH BIOPSY (Throat) RIGID ESOPHAGOSCOPY (Throat)     Patient location during evaluation: PACU Anesthesia Type: General Level of consciousness: awake Pain management: pain level controlled Vital Signs Assessment: post-procedure vital signs reviewed and stable Respiratory status: respiratory function stable Cardiovascular status: stable Postop Assessment: no apparent nausea or vomiting Anesthetic complications: no   No notable events documented.  Veda Canning

## 2021-03-22 NOTE — H&P (Signed)
History and physical reviewed and will be scanned in later. No change in medical status reported by the patient or family, appears stable for surgery. All questions regarding the procedure answered, and patient (or family if a child) expressed understanding of the procedure. ? ?Travis Arellano S Leeland Lovelady ?@TODAY@ ?

## 2021-03-22 NOTE — Anesthesia Preprocedure Evaluation (Signed)
Anesthesia Evaluation  Patient identified by MRN, date of birth, ID band Patient awake    Reviewed: Allergy & Precautions, NPO status   Airway Mallampati: II  TM Distance: >3 FB     Dental   Pulmonary former smoker,    Pulmonary exam normal        Cardiovascular hypertension,  Rhythm:Regular Rate:Normal  HLD   Neuro/Psych    GI/Hepatic negative GI ROS,   Endo/Other  negative endocrine ROS  Renal/GU      Musculoskeletal   Abdominal   Peds  Hematology   Anesthesia Other Findings Throat lesion  Reproductive/Obstetrics                            Anesthesia Physical Anesthesia Plan  ASA: 2  Anesthesia Plan: General   Post-op Pain Management:    Induction: Intravenous  PONV Risk Score and Plan: Ondansetron, Dexamethasone, Midazolam and Treatment may vary due to age or medical condition  Airway Management Planned: Oral ETT  Additional Equipment:   Intra-op Plan:   Post-operative Plan:   Informed Consent: I have reviewed the patients History and Physical, chart, labs and discussed the procedure including the risks, benefits and alternatives for the proposed anesthesia with the patient or authorized representative who has indicated his/her understanding and acceptance.     Dental advisory given  Plan Discussed with: CRNA  Anesthesia Plan Comments:         Anesthesia Quick Evaluation

## 2021-03-22 NOTE — Op Note (Signed)
03/22/2021  10:54 AM    Lenda Kelp  [id number]    Pre-Op Diagnosis:  Dysphagia. Lesion in hypopharynx  Post-op Diagnosis: Dysphagia. Lesion in hypopharynx  Procedure:  Direct Laryngoscopy with Biopsy, rigid esophagoscopy  Surgeon:  Riley Nearing., MD  Anesthesia:  General Endotracheal  EBL: Minimal  Complications:  None  Findings: Friable lesion involving the medial wall of the left hypopharynx extending up to the apex of the piriform sinus but not involving the lateral wall.  Lesion extends distally to the esophageal verge.  The upper esophagus appeared clear.  There is fullness of the left area epiglottic fold but the lesion does not have mucosal extension into the endolarynx.  The vocal cords were clear.  The tongue base was free of lesions.  Procedure: With the patient in a comfortable supine position, general endotracheal anesthesia was induced without difficulty.  At an appropriate level, the table was turned 90 degrees away from Anesthesia.  A clean preparation and draping was performed in the standard fashion. A tooth guard was placed, protecting the upper gingiva.  Using the Dedo laryngoscope, the oropharynx, hypopharynx and larynx were carefully inspected.  Findings are as noted above.  A rigid esophagoscope was lubricated and carefully passed to help determine the distal extent of the left piriform sinus lesion.  The scope was passed into the upper esophagus where no mucosal lesion was visualized.  The distal esophagus was not visualized as there was some resistance to passing the rigid scope, so no attempt was made to proceed further although no lesion was seen.  The resistance was felt to be due to some kyphosis.  Biopsies were taken from the left piriform sinus medial wall. Bleeding was minimal.  The findings were as described above.  The laryngoscope was removed.  The neck was palpated on both sides with the findings as described above.  At this point the  procedure was completed.  The patient was returned to Anesthesia, awakened, extubated, and transferred to PACU in satisfactory condition.   Disposition: To PACU, then discharge home  Plan: Soft, bland diet, advance as tolerated. Take pain medications as prescribed. Follow-up to be determined based on biopsies.  Potential referral to the cancer center.  Riley Nearing 03/22/2021 10:54 AM

## 2021-03-23 ENCOUNTER — Other Ambulatory Visit: Payer: Self-pay | Admitting: Otolaryngology

## 2021-03-23 ENCOUNTER — Encounter: Payer: Self-pay | Admitting: Otolaryngology

## 2021-03-23 LAB — SURGICAL PATHOLOGY

## 2021-03-30 ENCOUNTER — Telehealth: Payer: Self-pay

## 2021-03-30 ENCOUNTER — Inpatient Hospital Stay: Payer: Medicare Other | Attending: Internal Medicine | Admitting: Internal Medicine

## 2021-03-30 ENCOUNTER — Inpatient Hospital Stay: Payer: Medicare Other

## 2021-03-30 ENCOUNTER — Other Ambulatory Visit: Payer: Self-pay

## 2021-03-30 ENCOUNTER — Encounter: Payer: Self-pay | Admitting: Internal Medicine

## 2021-03-30 DIAGNOSIS — C12 Malignant neoplasm of pyriform sinus: Secondary | ICD-10-CM | POA: Diagnosis present

## 2021-03-30 DIAGNOSIS — Z801 Family history of malignant neoplasm of trachea, bronchus and lung: Secondary | ICD-10-CM | POA: Diagnosis not present

## 2021-03-30 DIAGNOSIS — I10 Essential (primary) hypertension: Secondary | ICD-10-CM | POA: Diagnosis not present

## 2021-03-30 DIAGNOSIS — Z87891 Personal history of nicotine dependence: Secondary | ICD-10-CM | POA: Diagnosis not present

## 2021-03-30 LAB — CBC WITH DIFFERENTIAL/PLATELET
Abs Immature Granulocytes: 0.04 10*3/uL (ref 0.00–0.07)
Basophils Absolute: 0 10*3/uL (ref 0.0–0.1)
Basophils Relative: 1 %
Eosinophils Absolute: 0.2 10*3/uL (ref 0.0–0.5)
Eosinophils Relative: 3 %
HCT: 44.2 % (ref 39.0–52.0)
Hemoglobin: 15 g/dL (ref 13.0–17.0)
Immature Granulocytes: 1 %
Lymphocytes Relative: 18 %
Lymphs Abs: 1.5 10*3/uL (ref 0.7–4.0)
MCH: 30.3 pg (ref 26.0–34.0)
MCHC: 33.9 g/dL (ref 30.0–36.0)
MCV: 89.3 fL (ref 80.0–100.0)
Monocytes Absolute: 0.7 10*3/uL (ref 0.1–1.0)
Monocytes Relative: 9 %
Neutro Abs: 5.9 10*3/uL (ref 1.7–7.7)
Neutrophils Relative %: 68 %
Platelets: 177 10*3/uL (ref 150–400)
RBC: 4.95 MIL/uL (ref 4.22–5.81)
RDW: 12.6 % (ref 11.5–15.5)
WBC: 8.4 10*3/uL (ref 4.0–10.5)
nRBC: 0 % (ref 0.0–0.2)

## 2021-03-30 LAB — COMPREHENSIVE METABOLIC PANEL
ALT: 16 U/L (ref 0–44)
AST: 17 U/L (ref 15–41)
Albumin: 3.7 g/dL (ref 3.5–5.0)
Alkaline Phosphatase: 73 U/L (ref 38–126)
Anion gap: 9 (ref 5–15)
BUN: 16 mg/dL (ref 8–23)
CO2: 25 mmol/L (ref 22–32)
Calcium: 8.8 mg/dL — ABNORMAL LOW (ref 8.9–10.3)
Chloride: 105 mmol/L (ref 98–111)
Creatinine, Ser: 0.99 mg/dL (ref 0.61–1.24)
GFR, Estimated: >60 mL/min (ref >60)
Glucose, Bld: 123 mg/dL — ABNORMAL HIGH (ref 70–99)
Potassium: 4.2 mmol/L (ref 3.5–5.1)
Sodium: 139 mmol/L (ref 135–145)
Total Bilirubin: 0.7 mg/dL (ref 0.3–1.2)
Total Protein: 6.7 g/dL (ref 6.5–8.1)

## 2021-03-30 LAB — MAGNESIUM: Magnesium: 2 mg/dL (ref 1.7–2.4)

## 2021-03-30 NOTE — Telephone Encounter (Signed)
Order for port placement entered and scheduling request for port placement ASAP faxed to specialty scheduling.

## 2021-03-30 NOTE — Telephone Encounter (Signed)
Port placement with IR scheduled for Tues 1/24 at 10:30 and to arrive at 9:30.  The IR Nurse will call him a few days prior to his procedure to go over his instructions.

## 2021-03-30 NOTE — Progress Notes (Signed)
[facility] NOTE  Patient Care Team: [facility] as PCP - General Rogue Bussing, Elisha Headland, MD as Consulting Physician (Oncology) Noreene Filbert, MD as Consulting Physician (Radiation Oncology) Clyde Canterbury, MD as Referring Physician (Otolaryngology)  CHIEF COMPLAINTS/PURPOSE OF CONSULTATION: head and neck cancer  #  Oncology History Overview Note  DIAGNOSIS:  A. PIRIFORM SINUS LESION, LEFT; BIOPSY:  - INVASIVE SQUAMOUS CELL CARCINOMA, KERATINIZING.   Asymmetric soft tissue thickening and enhancement of the left aryepiglottic fold and adjacent paraglottic fat suspicious for malignancy. Abnormal right level 1/2 and left level 2 likely cystic/necrotic nodes.   A subcentimeter low-density lesion of the right vallecula probably reflects a cyst but direct visual inspection is recommended.        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      HISTORY OF PRESENTING ILLNESS:  Travis Arellano 76 y.o.  male with remote history of smoking-no other malignancies has been referred to [place] for new diagnosis of throat cancer.  Patient noted to have hoarseness of voice over the last 3 to 4 months.  This led to further evaluation with ENT-that showed a small growth approximately centimeter-in the left pyriform sinus.  CT scan neck showed thickening in the left aryepiglottic area; also noted to have 1 to 2 cm lymph node in the right neck.   Denies any weight loss but denies any tingling or numbness.  Denies any nausea vomiting.  Review of Systems  Constitutional:  Negative for chills, diaphoresis, fever, malaise/fatigue and weight loss.  HENT:  Negative for nosebleeds and sore throat.   Eyes:  Negative for  double vision.  Respiratory:  Negative for cough, hemoptysis, sputum production, shortness of breath and wheezing.   Cardiovascular:  Negative for chest pain, palpitations, orthopnea and leg swelling.  Gastrointestinal:  Negative for abdominal pain, blood in stool, constipation, diarrhea, heartburn, melena, nausea and vomiting.  Genitourinary:  Negative for dysuria, frequency and urgency.  Musculoskeletal:  Negative for back pain and joint pain.  Skin: Negative.  Negative for itching and rash.  Neurological:  Negative for dizziness, tingling, focal weakness, weakness and headaches.  Endo/Heme/Allergies:  Does not bruise/bleed easily.  Psychiatric/Behavioral:  Negative for depression. The patient is not nervous/anxious and does not have insomnia.     MEDICAL HISTORY:  Past Medical History:  Diagnosis Date   Cancer of pyriform sinus ([facility])    High cholesterol    Hypertension    Wears dentures    Full upper, partial lower    SURGICAL HISTORY: Past Surgical History:  Procedure Laterality Date   CATARACT EXTRACTION W/PHACO Right 05/17/2020   Procedure: CATARACT EXTRACTION PHACO AND INTRAOCULAR LENS PLACEMENT (IOC);  Surgeon: Baruch Goldmann, MD;  Location: [facility];  Service: Ophthalmology;  Laterality: Right;  CDE: 15.03   CATARACT EXTRACTION W/PHACO Left 06/11/2020   Procedure: CATARACT EXTRACTION PHACO AND INTRAOCULAR LENS PLACEMENT (IOC);  Surgeon: Baruch Goldmann, MD;  Location: [facility];  Service: Ophthalmology;  Laterality: Left;  CDE: 13.98   COLONOSCOPY     MICROLARYNGOSCOPY N/A 03/22/2021   Procedure: MICROSCOPIC DIRECT LARYNGOSCOPY WITH BIOPSY;  Surgeon: Clyde Canterbury, MD;  Location: [facility];  Service: ENT;  Laterality: N/A;   RIGID ESOPHAGOSCOPY  N/A 03/22/2021   Procedure: RIGID ESOPHAGOSCOPY;  Surgeon: Clyde Canterbury, MD;  Location: [facility];  Service: ENT;  Laterality: N/A;    SOCIAL HISTORY: Social History   Socioeconomic History   Marital status: Divorced     Spouse name: Not on file   Number of children: Not on file   Years of education: Not on file   Highest education level: Not on file  Occupational History   Not on file  Tobacco Use   Smoking status: Former    Types: Cigarettes    Quit date: 1999    Years since quitting: 24.0   Smokeless tobacco: Never  Vaping Use   Vaping Use: Never used  Substance and Sexual Activity   Alcohol use: No   Drug use: No   Sexual activity: Not on file  Other Topics Concern   Not on file  Social History Narrative   Lives between [place] [[address] mins drive]; with son and brother. Quit smoking in 1999 [prior all life]; quit alcohol 30 years- used to drink beer. Retd/ Part time- auto-mechanic work.    Social Determinants of Health   Financial Resource Strain: Not on file  Food Insecurity: Not on file  Transportation Needs: Not on file  Physical Activity: Not on file  Stress: Not on file  Social Connections: Not on file  Intimate Partner Violence: Not on file    FAMILY HISTORY: Family History  Problem Relation Age of Onset   Congestive Heart Failure Mother    Diabetes Father    Lung cancer Brother     ALLERGIES:  has No Known Allergies.  MEDICATIONS:  Current Outpatient Medications  Medication Sig Dispense Refill   doxazosin (CARDURA) 4 MG tablet Take 4 mg by mouth daily.     hydrochlorothiazide (MICROZIDE) 12.5 MG capsule Take 12.5 mg by mouth daily.     HYDROcodone-acetaminophen (HYCET) 7.5-325 mg/15 ml solution 10-15 cc PO every 4-6 hours as needed for pain 300 mL 0   lisinopril (PRINIVIL,ZESTRIL) 20 MG tablet Take 20 mg by mouth at bedtime.     metoprolol succinate (TOPROL-XL) 100 MG 24 hr tablet Take 100 mg by mouth daily.     pravastatin (PRAVACHOL) 40 MG tablet Take 40 mg by mouth at bedtime.     No current facility-administered medications for this visit.     PHYSICAL EXAMINATION: ECOG PERFORMANCE STATUS: 1 - Symptomatic but completely ambulatory  Vitals:    03/30/21 1027  BP: 117/72  Pulse: 62  Temp: (!) 95 F (35 C)  SpO2: 100%   Filed Weights   03/30/21 1027  Weight: 189 lb 3.2 oz (85.8 kg)    Physical Exam Vitals and nursing note reviewed.  HENT:     Head: Normocephalic and atraumatic.     Mouth/Throat:     Pharynx: Oropharynx is clear.  Eyes:     Extraocular Movements: Extraocular movements intact.     Pupils: Pupils are equal, round, and reactive to light.  Cardiovascular:     Rate and Rhythm: Normal rate and regular rhythm.  Pulmonary:     Comments: Decreased breath sounds bilaterally.  Abdominal:     Palpations: Abdomen is soft.  Musculoskeletal:        General: Normal range of motion.     Cervical back: Normal range of motion.  Skin:    General: Skin is warm.  Neurological:     General: No focal deficit present.     Mental Status: He is alert and  oriented to person, place, and time.  Psychiatric:        Behavior: Behavior normal.        Judgment: Judgment normal.     LABORATORY DATA:  I have reviewed the data as listed Lab Results  Component Value Date   WBC 8.4 03/30/2021   HGB 15.0 03/30/2021   HCT 44.2 03/30/2021   MCV 89.3 03/30/2021   PLT 177 03/30/2021   Recent Labs    03/18/21 1340 03/30/21 1139  NA  --  139  K  --  4.2  CL  --  105  CO2  --  25  GLUCOSE  --  123*  BUN  --  16  CREATININE 1.10 0.99  CALCIUM  --  8.8*  GFRNONAA  --  >60  PROT  --  6.7  ALBUMIN  --  3.7  AST  --  17  ALT  --  16  ALKPHOS  --  73  BILITOT  --  0.7    RADIOGRAPHIC STUDIES: I have personally reviewed the radiological images as listed and agreed with the findings in the report. CT SOFT TISSUE NECK W CONTRAST  Result Date: 03/18/2021 CLINICAL DATA:  Sore throat, biopsy scheduled EXAM: CT NECK WITH CONTRAST TECHNIQUE: Multidetector CT imaging of the neck was performed using the standard protocol following the bolus administration of intravenous contrast. CONTRAST:  23mL OMNIPAQUE IOHEXOL 300 MG/ML  SOLN  COMPARISON:  None. FINDINGS: Pharynx and larynx: 9 mm low-density lesion within the right vallecula with a small focus of calcification at the periphery. There is asymmetric enhancement and soft tissue thickening of the left aryepiglottic fold and adjacent paraglottic fat. Foci of calcification within the palatine tonsils probably reflects sequelae of prior inflammation. Salivary glands: Parotid and submandibular glands are unremarkable. Thyroid: Mildly heterogeneous. No ultrasound follow-up is recommended by current guidelines. Lymph nodes: There is a low-density right level 1/2 lesion abutting the internal jugular vein as well as the submandibular gland measuring 1.8 cm AP. Enhancement anteriorly appears to be within the lesion. There is a nonenlarged 8 mm left level 2 node (series 2, image 55) with some internal low-density. No additional potential enlarged or abnormal density nodes. Vascular: Major neck vessels are patent. There is mild calcified plaque at the common carotid bifurcations. Limited intracranial: No abnormal enhancement. Visualized orbits: Not included. Mastoids and visualized paranasal sinuses: Mastoid air cells are clear. Mild polypoid mucosal thickening. Skeleton: Cervical spine degenerative changes. Upper chest: Mild paraseptal emphysema. IMPRESSION: Asymmetric soft tissue thickening and enhancement of the left aryepiglottic fold and adjacent paraglottic fat suspicious for malignancy. Abnormal right level 1/2 and left level 2 likely cystic/necrotic nodes. A subcentimeter low-density lesion of the right vallecula probably reflects a cyst but direct visual inspection is recommended. Biopsy is reportedly scheduled but no additional information was provided. Electronically Signed   By: Macy Mis M.D.   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  Ordered  PET scan stat to help radiation planning  # I reviewed with the patient the option of definitive chemotherapy and radiation therapy concurrently.  Usually the treatments last 6-8 week [ with weekly chemotherapy and radiation therapy offered Monday through Friday. Surgery could be reserved for residual/recurrent malignancy. Discussed that would recommend reimaging with a PET scan 8 to 12 weeks post chemoradiation.  I discussed that the treatments are  fairly intensive; but the goal would be cure. Radiation oncology referral.   Discussed with the patient that we will plan to coordinate his treatment along with start of radiation therapy.  Chemotherapy education; antiemetics-Next visit  #Risk of weight loss / difficulty swallowing-counseled the patient regarding potential weight loss; moderate to severe mucositis from chemoradiation.  Hold off PEG tube placement.  Referral to nutrition next visit  # IV access/Mediport-discussed pros and cons of Mediport placement.  Patient agrees; proceed with port placement.  Thank you Dr.Bennett for allowing me to participate in the care of your pleasant patient. Please do not hesitate to contact me with questions or concerns in the interim.  # DISPOSITION: # port placement ASAP- IR  # Referral to Dr.Crystal re: head and neck cancer- [facility] # PET STAT # labs today- cbc/cmp/mag # follow up 1 week or so-- Dr.B    All questions were answered. The patient knows to call the clinic with any problems, questions or concerns.       Cammie Sickle, MD 03/31/2021 12:46 PM

## 2021-03-30 NOTE — Progress Notes (Signed)
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

## 2021-03-30 NOTE — Telephone Encounter (Signed)
Patient notified

## 2021-03-30 NOTE — Assessment & Plan Note (Addendum)
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  Ordered PET scan stat to help radiation planning  # I reviewed with the patient the option of definitive chemotherapy and radiation therapy concurrently.  Usually the treatments last 6-8 week [ with weekly chemotherapy and radiation therapy offered Monday through Friday. Surgery could be reserved for residual/recurrent malignancy. Discussed that would recommend reimaging with a PET scan 8 to 12 weeks post chemoradiation.  I discussed that the treatments are fairly intensive; but the goal would be cure. Radiation oncology referral.   Discussed with the patient that we will plan to coordinate his treatment along with start of radiation therapy.  Chemotherapy education; antiemetics-Next visit  #Risk of weight loss / difficulty swallowing-counseled the patient regarding potential weight loss; moderate to severe mucositis from chemoradiation.  Hold off PEG tube placement.  Referral to nutrition next visit  # IV access/Mediport-discussed pros and cons of Mediport placement.  Patient agrees; proceed with port placement.  Thank you Dr.Bennett for allowing me to participate in the care of your pleasant patient. Please do not hesitate to contact me with questions or concerns in the interim.  # DISPOSITION: # port placement ASAP- IR  # Referral to Dr.Crystal re: head and neck cancer- [facility] # PET STAT # labs today- cbc/cmp/mag # follow up 1 week or so-- Dr.B

## 2021-03-31 ENCOUNTER — Encounter: Payer: Self-pay | Admitting: *Deleted

## 2021-03-31 ENCOUNTER — Ambulatory Visit
Admission: RE | Admit: 2021-03-31 | Discharge: 2021-03-31 | Disposition: A | Discharge status: Home / Self Care | Payer: Medicare Other | Source: Ambulatory Visit | Attending: Internal Medicine | Admitting: Internal Medicine

## 2021-03-31 ENCOUNTER — Encounter: Payer: Self-pay | Admitting: Internal Medicine

## 2021-03-31 ENCOUNTER — Other Ambulatory Visit: Payer: Commercial Managed Care - HMO

## 2021-03-31 DIAGNOSIS — J439 Emphysema, unspecified: Secondary | ICD-10-CM | POA: Insufficient documentation

## 2021-03-31 DIAGNOSIS — J320 Chronic maxillary sinusitis: Secondary | ICD-10-CM | POA: Insufficient documentation

## 2021-03-31 DIAGNOSIS — K449 Diaphragmatic hernia without obstruction or gangrene: Secondary | ICD-10-CM | POA: Insufficient documentation

## 2021-03-31 DIAGNOSIS — M47814 Spondylosis without myelopathy or radiculopathy, thoracic region: Secondary | ICD-10-CM | POA: Insufficient documentation

## 2021-03-31 DIAGNOSIS — E041 Nontoxic single thyroid nodule: Secondary | ICD-10-CM | POA: Insufficient documentation

## 2021-03-31 DIAGNOSIS — J323 Chronic sphenoidal sinusitis: Secondary | ICD-10-CM | POA: Diagnosis not present

## 2021-03-31 DIAGNOSIS — C12 Malignant neoplasm of pyriform sinus: Secondary | ICD-10-CM | POA: Diagnosis present

## 2021-03-31 DIAGNOSIS — I700 Atherosclerosis of aorta: Secondary | ICD-10-CM | POA: Diagnosis not present

## 2021-03-31 DIAGNOSIS — I2510 Atherosclerotic heart disease of native coronary artery without angina pectoris: Secondary | ICD-10-CM | POA: Diagnosis not present

## 2021-03-31 DIAGNOSIS — K429 Umbilical hernia without obstruction or gangrene: Secondary | ICD-10-CM | POA: Insufficient documentation

## 2021-03-31 LAB — GLUCOSE, CAPILLARY: Glucose-Capillary: 109 mg/dL — ABNORMAL HIGH (ref 70–99)

## 2021-03-31 MED ORDER — FLUDEOXYGLUCOSE F - 18 (FDG) INJECTION
9.8000 | Freq: Once | INTRAVENOUS | Status: AC | PRN
  Administered 2021-03-31: 10 via INTRAVENOUS

## 2021-03-31 NOTE — Progress Notes (Signed)
Tumor Board Documentation  Cyle Kenyon was presented by Dr Richardson Landry and Dr Rogue Bussing at our Tumor Board on 03/31/2021, which included representatives from medical oncology, pathology, radiology, pharmacy, genetics, nutrition, research, pulmonology, internal medicine, surgical, navigation, palliative care.  Ramy currently presents as a new patient, for [place], for new positive pathology with history of the following treatments: surgical intervention(s).  Additionally, we reviewed previous medical and familial history, history of present illness, and recent lab results along with all available histopathologic and imaging studies. The tumor board considered available treatment options and made the following recommendations: Concurrent chemo-radiation therapy, Additional screening (PET Scan)    The following procedures/referrals were also placed: No orders of the defined types were placed in this encounter.   Clinical Trial Status: not discussed   Staging used: To be determined AJCC Staging: T: 2 N: 3   Group: Squamous Cell CArcinoma of Sinus   National site-specific guidelines NCCN were discussed with respect to the case.  Tumor board is a meeting of clinicians from various specialty areas who evaluate and discuss patients for whom a multidisciplinary approach is being considered. Final determinations in the plan of care are those of the provider(s). The responsibility for follow up of recommendations given during tumor board is that of the provider.   Todays extended care, comprehensive team conference, Yusef was not present for the discussion and was not examined.   Multidisciplinary Tumor Board is a multidisciplinary case peer review process.  Decisions discussed in the Multidisciplinary Tumor Board reflect the opinions of the specialists present at the conference without having examined the patient.  Ultimately, treatment and diagnostic decisions rest with the primary provider(s) and  the patient.

## 2021-04-01 ENCOUNTER — Other Ambulatory Visit: Payer: Commercial Managed Care - HMO

## 2021-04-04 ENCOUNTER — Other Ambulatory Visit: Payer: Self-pay

## 2021-04-04 ENCOUNTER — Encounter: Payer: Self-pay | Admitting: Radiation Oncology

## 2021-04-04 ENCOUNTER — Ambulatory Visit
Admission: RE | Admit: 2021-04-04 | Discharge: 2021-04-04 | Disposition: A | Discharge status: Home / Self Care | Payer: Medicare Other | Source: Ambulatory Visit | Attending: Radiation Oncology | Admitting: Radiation Oncology

## 2021-04-04 ENCOUNTER — Other Ambulatory Visit: Payer: Self-pay | Admitting: Radiology

## 2021-04-04 VITALS — BP 127/84 | HR 59 | Temp 97.0°F | Resp 16 | Wt 187.3 lb

## 2021-04-04 DIAGNOSIS — C329 Malignant neoplasm of larynx, unspecified: Secondary | ICD-10-CM

## 2021-04-04 NOTE — Consult Note (Signed)
NEW PATIENT EVALUATION  Name: Travis Arellano  MRN: [id number]  Date:   04/04/2021     DOB: February 20, 1946   This 76 y.o. 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  REFERRING PHYSICIAN: [facility]  CHIEF COMPLAINT:  Chief Complaint  Patient presents with   pyriform sinus mass    DIAGNOSIS: There were no encounter diagnoses.   PREVIOUS INVESTIGATIONS:  PET CT scan reviewed Pathology report reviewed Clinical notes reviewed  HPI: Patient is a 76 year old male who presented with several month history of increasing hoarseness.  He was evaluated by ENT found to have a small nodular area in the left piriform sinus.  CT scan confirmed thickening the left aryepiglottic fold and noted to have a 1 to 2 cm lymph node in the right neck.  He underwent biopsy of the left piriform sinus showing invasive keratinizing squamous cell carcinoma.  He underwent PET CT scan showing mucosal thickening of the left piriform sinus highly hypermetabolic.  He also had a contralateral right level lymph node to a mildly hypermetabolic along with more solid-appearing anterior inferior border.  No hypermetabolic activity was noted along the right tongue base or vallecula.  He has been seen by medical oncology and has been recommended for concurrent chemoradiation is having a port placed tomorrow he is still having some hoarseness which is stable.  He is having some dysphagia although is having no intentional weight loss.  PLANNED TREATMENT REGIMEN: Concurrent chemoradiation  PAST MEDICAL HISTORY:  has a past medical history of Cancer of pyriform sinus ([place]), High cholesterol, Hypertension, and Wears dentures.    PAST SURGICAL HISTORY:  Past Surgical History:  Procedure Laterality Date   CATARACT EXTRACTION W/PHACO Right 05/17/2020   Procedure: CATARACT EXTRACTION PHACO AND INTRAOCULAR LENS PLACEMENT (IOC);  Surgeon:  Baruch Goldmann, MD;  Location: [facility];  Service: Ophthalmology;  Laterality: Right;  CDE: 15.03   CATARACT EXTRACTION W/PHACO Left 06/11/2020   Procedure: CATARACT EXTRACTION PHACO AND INTRAOCULAR LENS PLACEMENT (IOC);  Surgeon: Baruch Goldmann, MD;  Location: [facility];  Service: Ophthalmology;  Laterality: Left;  CDE: 13.98   COLONOSCOPY     MICROLARYNGOSCOPY N/A 03/22/2021   Procedure: MICROSCOPIC DIRECT LARYNGOSCOPY WITH BIOPSY;  Surgeon: Clyde Canterbury, MD;  Location: [place];  Service: ENT;  Laterality: N/A;   RIGID ESOPHAGOSCOPY N/A 03/22/2021   Procedure: RIGID ESOPHAGOSCOPY;  Surgeon: Clyde Canterbury, MD;  Location: [place];  Service: ENT;  Laterality: N/A;    FAMILY HISTORY: family history includes Congestive Heart Failure in his mother; Diabetes in his father; Lung cancer in his brother.  SOCIAL HISTORY:  reports that he quit smoking about 24 years ago. His smoking use included cigarettes. He has never used smokeless tobacco. He reports that he does not drink alcohol and does not use drugs.  ALLERGIES: Patient has no known allergies.  MEDICATIONS:  Current Outpatient Medications  Medication Sig Dispense Refill   doxazosin (CARDURA) 4 MG tablet Take 4 mg by mouth daily.     hydrochlorothiazide (MICROZIDE) 12.5 MG capsule Take 12.5 mg by mouth daily.     HYDROcodone-acetaminophen (HYCET) 7.5-325 mg/15 ml solution 10-15 cc PO every 4-6 hours as needed for pain 300 mL 0   lisinopril (PRINIVIL,ZESTRIL) 20 MG tablet Take 20 mg by mouth at bedtime.     metoprolol succinate (TOPROL-XL) 100 MG 24 hr tablet Take 100 mg by mouth daily.  pravastatin (PRAVACHOL) 40 MG tablet Take 40 mg by mouth at bedtime.     No current facility-administered medications for this encounter.    ECOG PERFORMANCE STATUS:  1 - Symptomatic but completely ambulatory  REVIEW OF SYSTEMS: Patient denies any weight loss, fatigue, weakness, fever, chills or night sweats. Patient denies any loss of  vision, blurred vision. Patient denies any ringing  of the ears or hearing loss. No irregular heartbeat. Patient denies heart murmur or history of fainting. Patient denies any chest pain or pain radiating to her upper extremities. Patient denies any shortness of breath, difficulty breathing at night, cough or hemoptysis. Patient denies any swelling in the lower legs. Patient denies any nausea vomiting, vomiting of blood, or coffee ground material in the vomitus. Patient denies any stomach pain. Patient states has had normal bowel movements no significant constipation or diarrhea. Patient denies any dysuria, hematuria or significant nocturia. Patient denies any problems walking, swelling in the joints or loss of balance. Patient denies any skin changes, loss of hair or loss of weight. Patient denies any excessive worrying or anxiety or significant depression. Patient denies any problems with insomnia. Patient denies excessive thirst, polyuria, polydipsia. Patient denies any swollen glands, patient denies easy bruising or easy bleeding. Patient denies any recent infections, allergies or URI. Patient "s visual fields have not changed significantly in recent time.   PHYSICAL EXAM: BP 127/84 (BP Location: Left Arm, Patient Position: Sitting, Cuff Size: Normal)    Pulse (!) 59    Temp (!) 97 F (36.1 C) (Tympanic)    Resp 16    Wt 187 lb 4.8 oz (85 kg)    BMI 26.87 kg/m  No discrete evidence of adenopathy in the neck is noted.  Well-developed well-nourished patient in NAD. HEENT reveals PERLA, EOMI, discs not visualized.  Oral cavity is clear. No oral mucosal lesions are identified. Neck is clear without evidence of cervical or supraclavicular adenopathy. Lungs are clear to A&P. Cardiac examination is essentially unremarkable with regular rate and rhythm without murmur rub or thrill. Abdomen is benign with no organomegaly or masses noted. Motor sensory and DTR levels are equal and symmetric in the upper and lower  extremities. Cranial nerves II through XII are grossly intact. Proprioception is intact. No peripheral adenopathy or edema is identified. No motor or sensory levels are noted. Crude visual fields are within normal range.  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  We treat his remaining bilateral nodes to 29 Gray using IMRT treatment planning and delivery.  We choose IMRT to spare critical structures such as a salivary glands oropharynx spinal cord.  Risks and benefits of treatment occluding possible increased dysphagia skin reaction fatigue alteration blood counts alteration of taste all were described in detail to the patient.  I have personally set up and ordered CT simulation with PET/CT fusion study.  Patient comprehends my recommendations well.  I would like to take this opportunity to thank you for allowing me to participate in the care of your patient.Noreene Filbert, MD

## 2021-04-05 ENCOUNTER — Ambulatory Visit
Admission: RE | Admit: 2021-04-05 | Discharge: 2021-04-05 | Disposition: A | Discharge status: Home / Self Care | Payer: Medicare Other | Source: Ambulatory Visit | Attending: Internal Medicine | Admitting: Internal Medicine

## 2021-04-05 ENCOUNTER — Encounter: Payer: Self-pay | Admitting: Radiology

## 2021-04-05 DIAGNOSIS — C12 Malignant neoplasm of pyriform sinus: Secondary | ICD-10-CM | POA: Diagnosis present

## 2021-04-05 DIAGNOSIS — Z87891 Personal history of nicotine dependence: Secondary | ICD-10-CM | POA: Insufficient documentation

## 2021-04-05 HISTORY — PX: IR IMAGING GUIDED PORT INSERTION: IMG5740

## 2021-04-05 MED ORDER — SODIUM CHLORIDE 0.9 % IV SOLN
INTRAVENOUS | Status: DC
  Filled 2021-04-05: qty 1000

## 2021-04-05 MED ORDER — HEPARIN SOD (PORK) LOCK FLUSH 100 UNIT/ML IV SOLN
INTRAVENOUS | Status: AC
  Administered 2021-04-05: 12:00:00 500 [IU]
  Filled 2021-04-05: qty 5

## 2021-04-05 MED ORDER — FENTANYL CITRATE (PF) 100 MCG/2ML IJ SOLN
INTRAMUSCULAR | Status: AC
  Filled 2021-04-05: qty 2

## 2021-04-05 MED ORDER — MIDAZOLAM HCL 2 MG/2ML IJ SOLN
INTRAMUSCULAR | Status: AC | PRN
  Administered 2021-04-05 (×2): 1 mg via INTRAVENOUS

## 2021-04-05 MED ORDER — FENTANYL CITRATE (PF) 100 MCG/2ML IJ SOLN
INTRAMUSCULAR | Status: AC | PRN
Start: 2021-04-05 — End: 2021-04-05
  Administered 2021-04-05 (×2): 50 ug via INTRAVENOUS

## 2021-04-05 MED ORDER — MIDAZOLAM HCL 2 MG/2ML IJ SOLN
INTRAMUSCULAR | Status: AC
  Filled 2021-04-05: qty 2

## 2021-04-05 MED ORDER — LIDOCAINE-EPINEPHRINE 1 %-1:100000 IJ SOLN
INTRAMUSCULAR | Status: AC
  Administered 2021-04-05: 12:00:00 10 mL
  Filled 2021-04-05: qty 1

## 2021-04-05 NOTE — Procedures (Signed)
Interventional Radiology Procedure Note  Procedure: RT IJ POWER PORT    Complications: None  Estimated Blood Loss:  MIN  Findings: TIP SVCRA    M. TREVOR Einer Meals, MD    

## 2021-04-05 NOTE — H&P (Signed)
Chief Complaint: Patient was seen in consultation today for SCC head and neck cancer at the request of Brahmanday,Govinda R  Referring Physician(s): Cammie Sickle  Supervising Physician: Daryll Brod  Patient Status: [facility] - Out-pt  History of Present Illness: Travis Arellano is a 76 y.o. male here today for scheduled elective port a catheter placement with moderate sedation under image guidance. The patient has pertinent PMHx of pyriform sinus invasive SCC who has been seen by Oncology on 1/18 with no medical changes since.   The patient has had a H&P performed within the last 30 days, all history, medications, and exam have been reviewed. The patient denies any interval changes since the H&P.  The patient denies any current chest pain or shortness of breath. He denies any current blood thinner use, denies any known bleeding or clotting disorder. The patient denies any recent infections, fever or chills. The patient denies any history of sleep apnea or chronic oxygen use. He has no known complications to sedation.    Past Medical History:  Diagnosis Date   Cancer of pyriform sinus ([facility])    High cholesterol    Hypertension    Wears dentures    Full upper, partial lower    Past Surgical History:  Procedure Laterality Date   CATARACT EXTRACTION W/PHACO Right 05/17/2020   Procedure: CATARACT EXTRACTION PHACO AND INTRAOCULAR LENS PLACEMENT (IOC);  Surgeon: Baruch Goldmann, MD;  Location: [facility];  Service: Ophthalmology;  Laterality: Right;  CDE: 15.03   CATARACT EXTRACTION W/PHACO Left 06/11/2020   Procedure: CATARACT EXTRACTION PHACO AND INTRAOCULAR LENS PLACEMENT (IOC);  Surgeon: Baruch Goldmann, MD;  Location: [facility];  Service: Ophthalmology;  Laterality: Left;  CDE: 13.98   COLONOSCOPY     MICROLARYNGOSCOPY N/A 03/22/2021   Procedure: MICROSCOPIC DIRECT LARYNGOSCOPY WITH BIOPSY;  Surgeon: Clyde Canterbury, MD;  Location: [place];  Service: ENT;  Laterality: N/A;    RIGID ESOPHAGOSCOPY N/A 03/22/2021   Procedure: RIGID ESOPHAGOSCOPY;  Surgeon: Clyde Canterbury, MD;  Location: [place];  Service: ENT;  Laterality: N/A;    Allergies: Patient has no known allergies.  Medications: Prior to Admission medications   Medication Sig Start Date End Date Taking? Authorizing Provider  doxazosin (CARDURA) 4 MG tablet Take 4 mg by mouth daily. 04/07/20  Yes [provider]  hydrochlorothiazide (MICROZIDE) 12.5 MG capsule Take 12.5 mg by mouth daily.   Yes [provider]  lisinopril (PRINIVIL,ZESTRIL) 20 MG tablet Take 20 mg by mouth at bedtime. 02/26/12  Yes [provider]  metoprolol succinate (TOPROL-XL) 100 MG 24 hr tablet Take 100 mg by mouth daily. 04/07/20  Yes [provider]  pravastatin (PRAVACHOL) 40 MG tablet Take 40 mg by mouth at bedtime.   Yes [provider]  HYDROcodone-acetaminophen (HYCET) 7.5-325 mg/15 ml solution 10-15 cc PO every 4-6 hours as needed for pain 03/22/21   Clyde Canterbury, MD     Family History  Problem Relation Age of Onset   Congestive Heart Failure Mother    Diabetes Father    Lung cancer Brother     Social History   Socioeconomic History   Marital status: Divorced    Spouse name: Not on file   Number of children: Not on file   Years of education: Not on file   Highest education level: Not on file  Occupational History   Not on file  Tobacco Use   Smoking status: Former    Types: Cigarettes    Quit date: 1999  Years since quitting: 24.0   Smokeless tobacco: Never  Vaping Use   Vaping Use: Never used  Substance and Sexual Activity   Alcohol use: No   Drug use: No   Sexual activity: Not on file  Other Topics Concern   Not on file  Social History Narrative   Lives between [place] [[address]]; with son and brother. Quit smoking in 1999 [prior all life]; quit alcohol 30 years- used to drink beer. Retd/ Part time- auto-mechanic work.     Social Determinants of Health   Financial Resource Strain: Not on file  Food Insecurity: Not on file  Transportation Needs: Not on file  Physical Activity: Not on file  Stress: Not on file  Social Connections: Not on file   Review of Systems: A 12 point ROS discussed and pertinent positives are indicated in the HPI above.  All other systems are negative.  Review of Systems  Vital Signs: BP 136/90    Pulse (!) 56    Temp (!) 97.5 F (36.4 C) (Oral)    Resp 18    Ht 5\' 10"  (1.778 m)    Wt 190 lb (86.2 kg)    SpO2 98%    BMI 27.26 kg/m   Physical Exam Constitutional:      Appearance: Normal appearance.  Cardiovascular:     Rate and Rhythm: Regular rhythm. Bradycardia present.  Pulmonary:     Effort: Pulmonary effort is normal. No respiratory distress.  Skin:    General: Skin is warm and dry.  Neurological:     Mental Status: He is alert and oriented to person, place, and time.    Imaging: CT SOFT TISSUE NECK W CONTRAST  Result Date: 03/18/2021 CLINICAL DATA:  Sore throat, biopsy scheduled EXAM: CT NECK WITH CONTRAST TECHNIQUE: Multidetector CT imaging of the neck was performed using the standard protocol following the bolus administration of intravenous contrast. CONTRAST:  39mL OMNIPAQUE IOHEXOL 300 MG/ML  SOLN COMPARISON:  None. FINDINGS: Pharynx and larynx: 9 mm low-density lesion within the right vallecula with a small focus of calcification at the periphery. There is asymmetric enhancement and soft tissue thickening of the left aryepiglottic fold and adjacent paraglottic fat. Foci of calcification within the palatine tonsils probably reflects sequelae of prior inflammation. Salivary glands: Parotid and submandibular glands are unremarkable. Thyroid: Mildly heterogeneous. No ultrasound follow-up is recommended by current guidelines. Lymph nodes: There is a low-density right level 1/2 lesion abutting the internal jugular vein as well as the submandibular gland measuring 1.8 cm  AP. Enhancement anteriorly appears to be within the lesion. There is a nonenlarged 8 mm left level 2 node (series 2, image 55) with some internal low-density. No additional potential enlarged or abnormal density nodes. Vascular: Major neck vessels are patent. There is mild calcified plaque at the common carotid bifurcations. Limited intracranial: No abnormal enhancement. Visualized orbits: Not included. Mastoids and visualized paranasal sinuses: Mastoid air cells are clear. Mild polypoid mucosal thickening. Skeleton: Cervical spine degenerative changes. Upper chest: Mild paraseptal emphysema. IMPRESSION: Asymmetric soft tissue thickening and enhancement of the left aryepiglottic fold and adjacent paraglottic fat suspicious for malignancy. Abnormal right level 1/2 and left level 2 likely cystic/necrotic nodes. A subcentimeter low-density lesion of the right vallecula probably reflects a cyst but direct visual inspection is recommended. Biopsy is reportedly scheduled but no additional information was provided. Electronically Signed   By: Macy Mis M.D.   On: 03/18/2021 14:21   NM PET Image Initial (PI) Skull Base  To Thigh (F-18 FDG)  Result Date: 03/31/2021 CLINICAL DATA:  Initial treatment strategy for head and neck cancer involving the piriform sinus. EXAM: NUCLEAR MEDICINE PET SKULL BASE TO THIGH TECHNIQUE: 10.0 mCi F-18 FDG was injected intravenously. Full-ring PET imaging was performed from the skull base to thigh after the radiotracer. CT data was obtained and used for attenuation correction and anatomic localization. Fasting blood glucose: 109 mg/dl COMPARISON:  03/18/2021 FINDINGS: Mediastinal blood pool activity: SUV max 3.0 Liver activity: SUV max NA NECK: Soft tissue thickening along the mucosa of the left piriform sinus is associated hypermetabolic activity, maximum SUV 10.5 compatible with malignancy. The contralateral (right-sided) level IIa lymph node measures 1.5 cm in short axis on image 60  of series 3, with accentuated activity primarily along the anterior inferior border (where there is greater degree of enhancement on prior CT) and maximum SUV of 4.0. A left level IIa lymph node measuring 0.7 cm in short axis on image 56 series 3 a maximum SUV of 2.5, below blood pool. On the prior CT there is fluid density in the right vallecula, this is less apparent on today's exam although the vallecular are effaced. No hypermetabolic activity in the tongue base/vallecula. Incidental CT findings: Mild chronic bilateral maxillary sinusitis and chronic right sphenoid sinusitis. Bilateral common carotid atherosclerotic calcification. CHEST: No significant abnormal hypermetabolic activity in this region. Incidental CT findings: Coronary, aortic arch, and branch vessel atherosclerotic vascular disease. Small type 1 hiatal hernia. Small thyroid nodules up to 0.9 cm in long axis are not hypermetabolic. Biapical pleuroparenchymal scarring. Paraseptal emphysema. Mild bibasilar scarring. Small nodules are present in the right lower lobe, 1 of largest measuring 6 by 4 mm on image 155 series 3. No associated hypermetabolic activity but these nodules are below sensitive PET-CT size thresholds. ABDOMEN/PELVIS: No significant abnormal hypermetabolic activity in this region. Incidental CT findings: Atherosclerosis is present, including aortoiliac atherosclerotic disease. Small umbilical hernia contains adipose tissue. Suspected right kidney lower pole parapelvic cyst. Mild prostatomegaly. SKELETON: No significant abnormal hypermetabolic activity in this region. Incidental CT findings: Mild thoracic spondylosis. IMPRESSION: 1. The mucosal thickening along the left piriform sinus is highly hypermetabolic with maximum SUV 10.5, compatible with malignancy. 2. The contralateral right level IIa lymph node is mildly hypermetabolic along its more solid-appearing anterior inferior border, maximum SUV 4.0. The left level IIa lymph node  has activity less than blood pool, although its partially cystic appearance was still unusual and suspicious in this context. 3. No specific hypermetabolic activity or substantial abnormality along the right tongue base/vallecula currently identified. 4. Subcentimeter incidental thyroid nodule. No follow-up imaging is recommended. Reference: J Am Coll Radiol. 2015 Feb;12(2): 143-50 5. Other imaging findings of potential clinical significance: Chronic paranasal sinusitis. Aortic Atherosclerosis (ICD10-I70.0) and Emphysema (ICD10-J43.9). Coronary atherosclerosis. Mild prostatomegaly. Electronically Signed   By: Van Clines M.D.   On: 03/31/2021 15:24    Labs:  CBC: Recent Labs    03/30/21 1139  WBC 8.4  HGB 15.0  HCT 44.2  PLT 177    COAGS: No results for input(s): INR, APTT in the last 8760 hours.  BMP: Recent Labs    03/18/21 1340 03/30/21 1139  NA  --  139  K  --  4.2  CL  --  105  CO2  --  25  GLUCOSE  --  123*  BUN  --  16  CALCIUM  --  8.8*  CREATININE 1.10 0.99  GFRNONAA  --  >60    LIVER FUNCTION  TESTS: Recent Labs    03/30/21 1139  BILITOT 0.7  AST 17  ALT 16  ALKPHOS 73  PROT 6.7  ALBUMIN 3.7   Assessment and Plan: 76 year old male here today for scheduled elective port a catheter placement with moderate sedation under image guidance. The patient has pertinent PMHx of pyriform sinus invasive SCC who has been seen by Oncology on 1/18 with no medical changes since.   The patient has been NPO, no blood thinners taken, labs and vitals have been reviewed.  Risks and benefits of image guided port-a-catheter placement was discussed with the patient including, but not limited to bleeding, infection, damage to adjacent structures, or fibrin sheath development and need for additional procedures.  All of the patient's questions were answered, patient is agreeable to proceed. Consent signed and in chart.   Thank you for this interesting consult.  I greatly  enjoyed meeting Adian Jablonowski and look forward to participating in their care.  A copy of this report was sent to the requesting provider on this date.  Electronically Signed: Hedy Jacob, PA-C 04/05/2021, 10:04 AM   I spent a total of 15 Minutes in face to face in clinical consultation, greater than 50% of which was counseling/coordinating care for head and neck cancer.

## 2021-04-07 ENCOUNTER — Inpatient Hospital Stay: Payer: Medicare Other | Admitting: Internal Medicine

## 2021-04-07 ENCOUNTER — Ambulatory Visit
Admission: RE | Admit: 2021-04-07 | Discharge: 2021-04-07 | Disposition: A | Discharge status: Home / Self Care | Payer: Medicare Other | Source: Ambulatory Visit | Attending: Radiation Oncology | Admitting: Radiation Oncology

## 2021-04-07 DIAGNOSIS — C12 Malignant neoplasm of pyriform sinus: Secondary | ICD-10-CM | POA: Insufficient documentation

## 2021-04-08 ENCOUNTER — Other Ambulatory Visit: Payer: Self-pay

## 2021-04-08 ENCOUNTER — Inpatient Hospital Stay (HOSPITAL_BASED_OUTPATIENT_CLINIC_OR_DEPARTMENT_OTHER): Payer: Medicare Other | Admitting: Internal Medicine

## 2021-04-08 DIAGNOSIS — C12 Malignant neoplasm of pyriform sinus: Secondary | ICD-10-CM

## 2021-04-08 MED ORDER — LIDOCAINE-PRILOCAINE 2.5-2.5 % EX CREA: 1.0000 "application " | TOPICAL_CREAM | CUTANEOUS | 1 refills | Status: DC | PRN

## 2021-04-08 MED ORDER — ONDANSETRON HCL 8 MG PO TABS: 8.0000 mg | ORAL_TABLET | Freq: Three times a day (TID) | ORAL | 1 refills | Status: DC | PRN

## 2021-04-08 MED ORDER — PROCHLORPERAZINE MALEATE 10 MG PO TABS: 10.0000 mg | ORAL_TABLET | Freq: Four times a day (QID) | ORAL | 1 refills | Status: DC | PRN

## 2021-04-08 NOTE — Assessment & Plan Note (Addendum)
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 The left level IIa lymph node has activity less than blood pool, although its partially cystic appearance was still unusual and suspicious.   #Discussed the treatment options include surgery-upfront which could be quite extensive; and usually followed by adjuvant chemoradiation therapy.  However the other option includes-definitive chemoradiation upfront; followed by salvage surgery if needed.  Discussed at tumor conference; discussed with Dr. Richardson Landry.  # I reviewed with the patient the option of definitive chemotherapy and radiation therapy concurrently.  Usually the treatments last 6-8 week [ with weekly chemotherapy and radiation therapy offered Monday through Friday. Surgery could be reserved for residual/recurrent malignancy. Discussed that would recommend reimaging with a PET scan 8 to 12 weeks post chemoradiation.  I discussed that the treatments are fairly intensive; but the goal would be cure. Radiation oncology referral.   Discussed with the patient that we will plan to coordinate his treatment along with start of radiation therapy.  Chemotherapy education; antiemetics-Next visit  #Risk of weight loss / difficulty swallowing-counseled the patient regarding potential weight loss; moderate to severe mucositis from chemoradiation.  Hold off PEG tube placement.  Referral to nutrition next visit  # #Incidental findings on PET scan Imaging dated: Jan 19th, 2023-emphysema atherosclerosis, mild prostate enlargement I reviewed/discussed/counseled the patient.   # IV access/Mediport-s/p mediport.  # DISPOSITION: # Week of Feb 6th- MD;  Cbc/cmp/mag; weekly cisplatin # D-8  labs- cbc/cmp/mag; cisplatin weekly.   # referral to [facility] re: head and neck cancer # referral to chemo ed re: weekly cisplatin-Dr.B  # I reviewed the blood work- with the patient in detail; also reviewed the imaging independently [as summarized above]; and with the patient in detail.   # 40 minutes face-to-face with the patient discussing the above plan of care; more than 50% of time spent on prognosis/ natural history; counseling and coordination.

## 2021-04-08 NOTE — Progress Notes (Signed)
[place] NOTE  Patient Care Team: [facility] as PCP - General Rogue Bussing, Elisha Headland, MD as Consulting Physician (Oncology) Noreene Filbert, MD as Consulting Physician (Radiation Oncology) Clyde Canterbury, MD as Referring Physician (Otolaryngology)  CHIEF COMPLAINTS/PURPOSE OF CONSULTATION: head and neck cancer  #  Oncology History Overview Note  DIAGNOSIS:  A. PIRIFORM SINUS LESION, LEFT; BIOPSY:  - INVASIVE SQUAMOUS CELL CARCINOMA, KERATINIZING.   Asymmetric soft tissue thickening and enhancement of the left aryepiglottic fold and adjacent paraglottic fat suspicious for malignancy. Abnormal right level 1/2 and left level 2 likely cystic/necrotic nodes.   A subcentimeter low-density lesion of the right vallecula probably reflects a cyst but direct visual inspection is recommended.  # JAN 2023-Pyriform sinus- SCC-[Dr.Bennett]clinically T1 [~1cm; N2a]; PET scan Jan 19th, 2023- The mucosal thickening along the left piriform sinus is highlyHypermetabolic'  The contralateral right level IIa lymph node is mildly hypermetabolic along its more solid-appearing anterior inferior border, maximum SUV 4.0. The left level IIa lymph node has activity less than blood pool, although its partially cystic appearance was still unusual and suspicious.   # FEB 6th, 2023- Cisplatin-RT        Cancer of pyriform sinus ([place])  03/30/2021 Initial Diagnosis   Cancer of pyriform sinus ([place])   04/18/2021 -  Chemotherapy   Patient is on Treatment Plan : HEAD/NECK Cisplatin q7d      Cancer Staging   Cancer Staging  No matching staging information was found for the patient.   04/18/2021 Cancer Staging   Staging form: Pharynx - Hypopharynx, AJCC 8th Edition - Clinical: Stage IVA (cT2, cN2c, cM0) - Signed by Cammie Sickle, MD on 04/18/2021       HISTORY OF PRESENTING ILLNESS:  Travis Arellano 76 y.o.  male newly diagnosed pyriform sinus  cancer/locally advanced is here for follow-up//review the results of the PET scan.  Patient denies any new symptoms other than continued hoarseness of voice.  Denies new shortness of breath or cough.  Review of Systems  Constitutional:  Negative for chills, diaphoresis, fever, malaise/fatigue and weight loss.  HENT:  Negative for nosebleeds and sore throat.   Eyes:  Negative for double vision.  Respiratory:  Negative for cough, hemoptysis, sputum production, shortness of breath and wheezing.   Cardiovascular:  Negative for chest pain, palpitations, orthopnea and leg swelling.  Gastrointestinal:  Negative for abdominal pain, blood in stool, constipation, diarrhea, heartburn, melena, nausea and vomiting.  Genitourinary:  Negative for dysuria, frequency and urgency.  Musculoskeletal:  Negative for back pain and joint pain.  Skin: Negative.  Negative for itching and rash.  Neurological:  Negative for dizziness, tingling, focal weakness, weakness and headaches.  Endo/Heme/Allergies:  Does not bruise/bleed easily.  Psychiatric/Behavioral:  Negative for depression. The patient is not nervous/anxious and does not have insomnia.     MEDICAL HISTORY:  Past Medical History:  Diagnosis Date   Cancer of pyriform sinus (HCC)    High cholesterol    Hypertension    Wears dentures    Full upper, partial lower    SURGICAL HISTORY: Past Surgical History:  Procedure Laterality Date   CATARACT EXTRACTION W/PHACO Right 05/17/2020   Procedure: CATARACT EXTRACTION PHACO AND INTRAOCULAR LENS PLACEMENT (IOC);  Surgeon: Baruch Goldmann, MD;  Location: [facility];  Service: Ophthalmology;  Laterality: Right;  CDE: 15.03   CATARACT EXTRACTION W/PHACO Left 06/11/2020   Procedure: CATARACT EXTRACTION PHACO AND INTRAOCULAR LENS PLACEMENT (IOC);  Surgeon: Baruch Goldmann, MD;  Location:  [facility];  Service: Ophthalmology;  Laterality: Left;  CDE: 13.98   COLONOSCOPY     IR IMAGING GUIDED PORT INSERTION  04/05/2021    MICROLARYNGOSCOPY N/A 03/22/2021   Procedure: MICROSCOPIC DIRECT LARYNGOSCOPY WITH BIOPSY;  Surgeon: Clyde Canterbury, MD;  Location: [facility];  Service: ENT;  Laterality: N/A;   RIGID ESOPHAGOSCOPY N/A 03/22/2021   Procedure: RIGID ESOPHAGOSCOPY;  Surgeon: Clyde Canterbury, MD;  Location: [facility];  Service: ENT;  Laterality: N/A;    SOCIAL HISTORY: Social History   Socioeconomic History   Marital status: Divorced    Spouse name: Not on file   Number of children: Not on file   Years of education: Not on file   Highest education level: Not on file  Occupational History   Not on file  Tobacco Use   Smoking status: Former    Types: Cigarettes    Quit date: 1999    Years since quitting: 24.1   Smokeless tobacco: Never  Vaping Use   Vaping Use: Never used  Substance and Sexual Activity   Alcohol use: No   Drug use: No   Sexual activity: Not on file  Other Topics Concern   Not on file  Social History Narrative   Lives between [place] [[address]]; with son and brother. Quit smoking in 1999 [prior all life]; quit alcohol 30 years- used to drink beer. Retd/ Part time- auto-mechanic work.    Social Determinants of Health   Financial Resource Strain: Not on file  Food Insecurity: Not on file  Transportation Needs: Not on file  Physical Activity: Not on file  Stress: Not on file  Social Connections: Not on file  Intimate Partner Violence: Not on file    FAMILY HISTORY: Family History  Problem Relation Age of Onset   Congestive Heart Failure Mother    Diabetes Father    Lung cancer Brother     ALLERGIES:  has No Known Allergies.  MEDICATIONS:  Current Outpatient Medications  Medication Sig Dispense Refill   doxazosin (CARDURA) 4 MG tablet Take 4 mg by mouth daily.     hydrochlorothiazide (MICROZIDE) 12.5 MG capsule Take 12.5 mg by mouth daily.     lidocaine-prilocaine (EMLA) cream Apply 1 application topically as needed. Apply to port  and cover with saran wrap 1-2 hours prior to port access 30 g 1   lisinopril (PRINIVIL,ZESTRIL) 20 MG tablet Take 20 mg by mouth at bedtime.     metoprolol succinate (TOPROL-XL) 100 MG 24 hr tablet Take 100 mg by mouth daily.     ondansetron (ZOFRAN) 8 MG tablet Take 1 tablet (8 mg total) by mouth every 8 (eight) hours as needed for nausea or vomiting (start 3 days; after chemo). 40 tablet 1   pravastatin (PRAVACHOL) 40 MG tablet Take 40 mg by mouth at bedtime.     prochlorperazine (COMPAZINE) 10 MG tablet Take 1 tablet (10 mg total) by mouth every 6 (six) hours as needed for nausea or vomiting. 40 tablet 1   HYDROcodone-acetaminophen (HYCET) 7.5-325 mg/15 ml solution 10-15 cc PO every 4-6 hours as needed for pain (Patient not taking: Reported on 04/18/2021) 300 mL 0   No current facility-administered medications for this visit.     PHYSICAL EXAMINATION: ECOG PERFORMANCE STATUS: 1 - Symptomatic but completely ambulatory  Vitals:   04/08/21 0916  BP: (!) 154/82  Pulse: (!) 57  Resp: 18  Temp: (!) 97.2 F (36.2 C)   Filed Weights   04/08/21  6222  Weight: 194 lb 3.2 oz (88.1 kg)    Physical Exam Vitals and nursing note reviewed.  HENT:     Head: Normocephalic and atraumatic.     Mouth/Throat:     Pharynx: Oropharynx is clear.  Eyes:     Extraocular Movements: Extraocular movements intact.     Pupils: Pupils are equal, round, and reactive to light.  Cardiovascular:     Rate and Rhythm: Normal rate and regular rhythm.  Pulmonary:     Comments: Decreased breath sounds bilaterally.  Abdominal:     Palpations: Abdomen is soft.  Musculoskeletal:        General: Normal range of motion.     Cervical back: Normal range of motion.  Skin:    General: Skin is warm.  Neurological:     General: No focal deficit present.     Mental Status: He is alert and oriented to person, place, and time.  Psychiatric:        Behavior: Behavior normal.        Judgment: Judgment normal.      LABORATORY DATA:  I have reviewed the data as listed Lab Results  Component Value Date   WBC 6.6 04/18/2021   HGB 14.4 04/18/2021   HCT 41.6 04/18/2021   MCV 87.6 04/18/2021   PLT 168 04/18/2021   Recent Labs    03/18/21 1340 03/30/21 1139 04/18/21 0759  NA  --  139 137  K  --  4.2 3.6  CL  --  105 104  CO2  --  25 26  GLUCOSE  --  123* 146*  BUN  --  16 17  CREATININE 1.10 0.99 1.02  CALCIUM  --  8.8* 9.1  GFRNONAA  --  >60 >60  PROT  --  6.7 6.8  ALBUMIN  --  3.7 3.6  AST  --  17 19  ALT  --  16 15  ALKPHOS  --  73 61  BILITOT  --  0.7 0.7    RADIOGRAPHIC STUDIES: I have personally reviewed the radiological images as listed and agreed with the findings in the report. NM PET Image Initial (PI) Skull Base To Thigh (F-18 FDG)  Result Date: 03/31/2021 CLINICAL DATA:  Initial treatment strategy for head and neck cancer involving the piriform sinus. EXAM: NUCLEAR MEDICINE PET SKULL BASE TO THIGH TECHNIQUE: 10.0 mCi F-18 FDG was injected intravenously. Full-ring PET imaging was performed from the skull base to thigh after the radiotracer. CT data was obtained and used for attenuation correction and anatomic localization. Fasting blood glucose: 109 mg/dl COMPARISON:  03/18/2021 FINDINGS: Mediastinal blood pool activity: SUV max 3.0 Liver activity: SUV max NA NECK: Soft tissue thickening along the mucosa of the left piriform sinus is associated hypermetabolic activity, maximum SUV 10.5 compatible with malignancy. The contralateral (right-sided) level IIa lymph node measures 1.5 cm in short axis on image 60 of series 3, with accentuated activity primarily along the anterior inferior border (where there is greater degree of enhancement on prior CT) and maximum SUV of 4.0. A left level IIa lymph node measuring 0.7 cm in short axis on image 56 series 3 a maximum SUV of 2.5, below blood pool. On the prior CT there is fluid density in the right vallecula, this is less apparent on  today's exam although the vallecular are effaced. No hypermetabolic activity in the tongue base/vallecula. Incidental CT findings: Mild chronic bilateral maxillary sinusitis and chronic right sphenoid sinusitis. Bilateral common carotid atherosclerotic calcification.  CHEST: No significant abnormal hypermetabolic activity in this region. Incidental CT findings: Coronary, aortic arch, and branch vessel atherosclerotic vascular disease. Small type 1 hiatal hernia. Small thyroid nodules up to 0.9 cm in long axis are not hypermetabolic. Biapical pleuroparenchymal scarring. Paraseptal emphysema. Mild bibasilar scarring. Small nodules are present in the right lower lobe, 1 of largest measuring 6 by 4 mm on image 155 series 3. No associated hypermetabolic activity but these nodules are below sensitive PET-CT size thresholds. ABDOMEN/PELVIS: No significant abnormal hypermetabolic activity in this region. Incidental CT findings: Atherosclerosis is present, including aortoiliac atherosclerotic disease. Small umbilical hernia contains adipose tissue. Suspected right kidney lower pole parapelvic cyst. Mild prostatomegaly. SKELETON: No significant abnormal hypermetabolic activity in this region. Incidental CT findings: Mild thoracic spondylosis. IMPRESSION: 1. The mucosal thickening along the left piriform sinus is highly hypermetabolic with maximum SUV 10.5, compatible with malignancy. 2. The contralateral right level IIa lymph node is mildly hypermetabolic along its more solid-appearing anterior inferior border, maximum SUV 4.0. The left level IIa lymph node has activity less than blood pool, although its partially cystic appearance was still unusual and suspicious in this context. 3. No specific hypermetabolic activity or substantial abnormality along the right tongue base/vallecula currently identified. 4. Subcentimeter incidental thyroid nodule. No follow-up imaging is recommended. Reference: J Am Coll Radiol. 2015  Feb;12(2): 143-50 5. Other imaging findings of potential clinical significance: Chronic paranasal sinusitis. Aortic Atherosclerosis (ICD10-I70.0) and Emphysema (ICD10-J43.9). Coronary atherosclerosis. Mild prostatomegaly. Electronically Signed   By: Van Clines M.D.   On: 03/31/2021 15:24   IR IMAGING GUIDED PORT INSERTION  Result Date: 04/05/2021 CLINICAL DATA:  Metastatic head and neck cancer, access for chemotherapy EXAM: RIGHT INTERNAL JUGULAR SINGLE LUMEN POWER PORT CATHETER INSERTION Date:  04/05/2021 04/05/2021 11:58 am Radiologist:  M. Daryll Brod, MD Guidance:  Ultrasound and fluoroscopic MEDICATIONS: 1% lidocaine with epinephrine local ANESTHESIA/SEDATION: Versed 2.0 mg IV; Fentanyl 100 mcg IV; Moderate Sedation Time:  31 minutes The patient was continuously monitored during the procedure by the interventional radiology nurse under my direct supervision. FLUOROSCOPY TIME:  0 minutes, 48 seconds (62.9 mGy) COMPLICATIONS: None immediate. CONTRAST:  None. PROCEDURE: Informed consent was obtained from the patient following explanation of the procedure, risks, benefits and alternatives. The patient understands, agrees and consents for the procedure. All questions were addressed. A time out was performed. Maximal barrier sterile technique utilized including caps, mask, sterile gowns, sterile gloves, large sterile drape, hand hygiene, and 2% chlorhexidine scrub. Under sterile conditions and local anesthesia, right internal jugular micropuncture venous access was performed. Access was performed with ultrasound. Images were obtained for documentation of the patent right internal jugular vein. A guide wire was inserted followed by a transitional dilator. This allowed insertion of a guide wire and catheter into the IVC. Measurements were obtained from the SVC / RA junction back to the right IJ venotomy site. In the right infraclavicular chest, a subcutaneous pocket was created over the second anterior rib.  This was done under sterile conditions and local anesthesia. 1% lidocaine with epinephrine was utilized for this. A 2.5 cm incision was made in the skin. Blunt dissection was performed to create a subcutaneous pocket over the right pectoralis major muscle. The pocket was flushed with saline vigorously. There was adequate hemostasis. The port catheter was assembled and checked for leakage. The port catheter was secured in the pocket with two retention sutures. The tubing was tunneled subcutaneously to the right venotomy site and inserted into the SVC/RA junction through a  valved peel-away sheath. Position was confirmed with fluoroscopy. Images were obtained for documentation. The patient tolerated the procedure well. No immediate complications. Incisions were closed in a two layer fashion with 4 - 0 Vicryl suture. Dermabond was applied to the skin. The port catheter was accessed, blood was aspirated followed by saline and heparin flushes. Needle was removed. A dry sterile dressing was applied. IMPRESSION: Ultrasound and fluoroscopically guided right internal jugular single lumen power port catheter insertion. Tip in the SVC/RA junction. Catheter ready for use. Electronically Signed   By: Jerilynn Mages.  Shick M.D.   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 The left level IIa lymph node has activity less than blood pool, although its partially cystic appearance was still unusual and suspicious.   #Discussed the treatment options include surgery-upfront which could be quite extensive; and usually followed by adjuvant chemoradiation therapy.  However the other option includes-definitive  chemoradiation upfront; followed by salvage surgery if needed.  Discussed at tumor conference; discussed with Dr. Richardson Landry.  # I reviewed with the patient the option of definitive chemotherapy and radiation therapy concurrently.  Usually the treatments last 6-8 week [ with weekly chemotherapy and radiation therapy offered Monday through Friday. Surgery could be reserved for residual/recurrent malignancy. Discussed that would recommend reimaging with a PET scan 8 to 12 weeks post chemoradiation.  I discussed that the treatments are fairly intensive; but the goal would be cure. Radiation oncology referral.   Discussed with the patient that we will plan to coordinate his treatment along with start of radiation therapy.  Chemotherapy education; antiemetics-Next visit  #Risk of weight loss / difficulty swallowing-counseled the patient regarding potential weight loss; moderate to severe mucositis from chemoradiation.  Hold off PEG tube placement.  Referral to nutrition next visit  # #Incidental findings on PET scan Imaging dated: Jan 19th, 2023-emphysema atherosclerosis, mild prostate enlargement I reviewed/discussed/counseled the patient.   # IV access/Mediport-s/p mediport.  # DISPOSITION: # Week of Feb 6th- MD;  Cbc/cmp/mag; weekly cisplatin # D-8  labs- cbc/cmp/mag; cisplatin weekly.  # referral to [facility] re: head and neck cancer # referral to chemo ed re: weekly cisplatin-Dr.B  # I reviewed the blood work- with the patient in detail; also reviewed the imaging independently [as summarized above]; and with the patient in detail.   # 40 minutes face-to-face with the patient discussing the above plan of care; more than 50% of time spent on prognosis/ natural history; counseling and coordination.      All questions were answered. The patient knows to call the clinic with any problems, questions or concerns.       Cammie Sickle, MD 04/18/2021 8:39 AM

## 2021-04-08 NOTE — Progress Notes (Signed)
Patient has throat pain with swallowing.  Does take Ibuprofen prior to meals that does help with the pain.  Is scheduled to start XRT on 04/18/21.  Rx for EMLA cream sent to pharmacy.

## 2021-04-11 NOTE — Progress Notes (Signed)
Pharmacist Chemotherapy Monitoring - Initial Assessment    Anticipated start date: 04/18/21   The following has been reviewed per standard work regarding the patient's treatment regimen: The patient's diagnosis, treatment plan and drug doses, and organ/hematologic function Lab orders and baseline tests specific to treatment regimen  The treatment plan start date, drug sequencing, and pre-medications Prior authorization status  Patient's documented medication list, including drug-drug interaction screen and prescriptions for anti-emetics and supportive care specific to the treatment regimen The drug concentrations, fluid compatibility, administration routes, and timing of the medications to be used The patient's access for treatment and lifetime cumulative dose history, if applicable  The patient's medication allergies and previous infusion related reactions, if applicable   Changes made to treatment plan:  treatment plan date  Follow up needed:  signing treatment plan   Adelina Mings, [place], 04/11/2021  9:07 AM

## 2021-04-12 DIAGNOSIS — C12 Malignant neoplasm of pyriform sinus: Secondary | ICD-10-CM | POA: Diagnosis not present

## 2021-04-13 ENCOUNTER — Other Ambulatory Visit: Payer: Self-pay

## 2021-04-13 ENCOUNTER — Inpatient Hospital Stay: Payer: Medicare Other | Attending: Internal Medicine | Admitting: Hospice and Palliative Medicine

## 2021-04-13 DIAGNOSIS — C12 Malignant neoplasm of pyriform sinus: Secondary | ICD-10-CM | POA: Insufficient documentation

## 2021-04-13 DIAGNOSIS — Z87891 Personal history of nicotine dependence: Secondary | ICD-10-CM | POA: Insufficient documentation

## 2021-04-13 DIAGNOSIS — Z510 Encounter for antineoplastic radiation therapy: Secondary | ICD-10-CM | POA: Insufficient documentation

## 2021-04-13 DIAGNOSIS — R070 Pain in throat: Secondary | ICD-10-CM | POA: Insufficient documentation

## 2021-04-13 DIAGNOSIS — I10 Essential (primary) hypertension: Secondary | ICD-10-CM | POA: Insufficient documentation

## 2021-04-13 DIAGNOSIS — Z801 Family history of malignant neoplasm of trachea, bronchus and lung: Secondary | ICD-10-CM | POA: Insufficient documentation

## 2021-04-13 DIAGNOSIS — B370 Candidal stomatitis: Secondary | ICD-10-CM | POA: Insufficient documentation

## 2021-04-13 DIAGNOSIS — E860 Dehydration: Secondary | ICD-10-CM | POA: Insufficient documentation

## 2021-04-13 DIAGNOSIS — R1310 Dysphagia, unspecified: Secondary | ICD-10-CM | POA: Insufficient documentation

## 2021-04-13 DIAGNOSIS — E7800 Pure hypercholesterolemia, unspecified: Secondary | ICD-10-CM | POA: Insufficient documentation

## 2021-04-13 DIAGNOSIS — Z79899 Other long term (current) drug therapy: Secondary | ICD-10-CM | POA: Insufficient documentation

## 2021-04-13 DIAGNOSIS — K5900 Constipation, unspecified: Secondary | ICD-10-CM | POA: Insufficient documentation

## 2021-04-13 NOTE — Progress Notes (Signed)
Multidisciplinary Oncology Council Documentation  Gable Odonohue was presented by our [facility] on 04/13/2021, which included representatives from:  Palliative Care Dietitian  Physical/Occupational Therapist Nurse Navigator Genetics Speech Therapist Social work Survivorship RN Financial Navigator Research RN   [place] currently presents with history of H&N cancer  We reviewed previous medical and familial history, history of present illness, and recent lab results along with all available histopathologic and imaging studies. The [facility] considered available treatment options and made the following recommendations/referrals:  Nutrition, SLP, LCSW. Recommend palliative care  The MOC is a meeting of clinicians from various specialty areas who evaluate and discuss patients for whom a multidisciplinary approach is being considered. Final determinations in the plan of care are those of the provider(s).   Today's extended care, comprehensive team conference, Aron was not present for the discussion and was not examined.

## 2021-04-14 ENCOUNTER — Inpatient Hospital Stay: Payer: Medicare Other

## 2021-04-14 ENCOUNTER — Ambulatory Visit: Admission: RE | Admit: 2021-04-14 | Payer: Medicare Other | Source: Ambulatory Visit

## 2021-04-15 ENCOUNTER — Encounter: Payer: Self-pay | Admitting: Licensed Clinical Social Worker

## 2021-04-15 NOTE — Progress Notes (Signed)
[place] Work  Initial Assessment   Travis Arellano is a 76 y.o. year old male contacted by phone. Clinical Social Work was referred by Travis Chang NP for assessment of psychosocial needs.   SDOH (Social Determinants of Health) assessments performed: No   Distress Screen completed: No ONCBCN DISTRESS SCREENING 03/30/2021  Screening Type Initial Screening  Distress experienced in past week (1-10) 3  Information Concerns Type Lack of info about treatment      Family/Social Information:  Housing Arrangement: patient lives with brother Travis Arellano Family members/support persons in your life? Family Transportation concerns: no  Employment: Retired. Income source: Paediatric nurse concerns: No Type of concern: None Food access concerns: no Religious or spiritual practice: yes Services Currently in place: N/A  Coping/ Adjustment to diagnosis: Patient understands treatment plan and what happens next? yes Concerns about diagnosis and/or treatment: I'm not especially worried about anything Patient reported stressors:  Patient stated he does not have current stressors Hopes and priorities: N/A Patient enjoys being outside and watching TV Current coping skills/ strengths: Ability for insight , Average or above average intelligence , Capable of independent living , Financial means , Religious Affiliation , and Supportive family/friends     SUMMARY: Current SDOH Barriers:  Patient currently does not have SDOH barriers.  Clinical Social Work Clinical Goal(s):  Patient is comfortable with treatment course, and fell he is able to manage independently. CSW encouraged patient to contact CSW or [facility], if he is unable to drive himself to appointments.  Patient lives in [name].  Interventions: Discussed common feeling and emotions when being diagnosed with cancer, and the importance of support during treatment Informed patient of the support team roles  and support services at [facility] Provided CSW contact information and encouraged patient to call with any questions or concerns Provided patient with information about CSW support and available resources.  Patient stated he is doing well and does not believe he will need resources or CSW assistance.   Follow Up Plan: Patient will contact CSW with any support or resource needs Patient verbalizes understanding of plan: Yes    Travis Arellano , LCSW

## 2021-04-18 ENCOUNTER — Other Ambulatory Visit: Payer: Self-pay

## 2021-04-18 ENCOUNTER — Inpatient Hospital Stay (HOSPITAL_BASED_OUTPATIENT_CLINIC_OR_DEPARTMENT_OTHER): Payer: Medicare Other | Admitting: Internal Medicine

## 2021-04-18 ENCOUNTER — Ambulatory Visit
Admission: RE | Admit: 2021-04-18 | Discharge: 2021-04-18 | Disposition: A | Discharge status: Home / Self Care | Payer: Medicare Other | Source: Ambulatory Visit | Attending: Radiation Oncology | Admitting: Radiation Oncology

## 2021-04-18 ENCOUNTER — Encounter: Payer: Self-pay | Admitting: Internal Medicine

## 2021-04-18 ENCOUNTER — Inpatient Hospital Stay: Payer: Medicare Other

## 2021-04-18 VITALS — Resp 18

## 2021-04-18 DIAGNOSIS — Z510 Encounter for antineoplastic radiation therapy: Secondary | ICD-10-CM | POA: Diagnosis present

## 2021-04-18 DIAGNOSIS — C12 Malignant neoplasm of pyriform sinus: Secondary | ICD-10-CM | POA: Insufficient documentation

## 2021-04-18 DIAGNOSIS — Z801 Family history of malignant neoplasm of trachea, bronchus and lung: Secondary | ICD-10-CM | POA: Diagnosis not present

## 2021-04-18 DIAGNOSIS — R070 Pain in throat: Secondary | ICD-10-CM | POA: Diagnosis not present

## 2021-04-18 DIAGNOSIS — E860 Dehydration: Secondary | ICD-10-CM | POA: Diagnosis not present

## 2021-04-18 DIAGNOSIS — E7800 Pure hypercholesterolemia, unspecified: Secondary | ICD-10-CM | POA: Diagnosis not present

## 2021-04-18 DIAGNOSIS — I10 Essential (primary) hypertension: Secondary | ICD-10-CM | POA: Diagnosis not present

## 2021-04-18 DIAGNOSIS — R1310 Dysphagia, unspecified: Secondary | ICD-10-CM | POA: Diagnosis not present

## 2021-04-18 DIAGNOSIS — K5900 Constipation, unspecified: Secondary | ICD-10-CM | POA: Diagnosis not present

## 2021-04-18 DIAGNOSIS — Z79899 Other long term (current) drug therapy: Secondary | ICD-10-CM | POA: Diagnosis not present

## 2021-04-18 DIAGNOSIS — Z87891 Personal history of nicotine dependence: Secondary | ICD-10-CM | POA: Diagnosis not present

## 2021-04-18 DIAGNOSIS — B370 Candidal stomatitis: Secondary | ICD-10-CM | POA: Diagnosis not present

## 2021-04-18 LAB — CBC WITH DIFFERENTIAL/PLATELET
Abs Immature Granulocytes: 0.02 10*3/uL (ref 0.00–0.07)
Basophils Absolute: 0 10*3/uL (ref 0.0–0.1)
Basophils Relative: 1 %
Eosinophils Absolute: 0.3 10*3/uL (ref 0.0–0.5)
Eosinophils Relative: 5 %
HCT: 41.6 % (ref 39.0–52.0)
Hemoglobin: 14.4 g/dL (ref 13.0–17.0)
Immature Granulocytes: 0 %
Lymphocytes Relative: 16 %
Lymphs Abs: 1 10*3/uL (ref 0.7–4.0)
MCH: 30.3 pg (ref 26.0–34.0)
MCHC: 34.6 g/dL (ref 30.0–36.0)
MCV: 87.6 fL (ref 80.0–100.0)
Monocytes Absolute: 0.6 10*3/uL (ref 0.1–1.0)
Monocytes Relative: 9 %
Neutro Abs: 4.6 10*3/uL (ref 1.7–7.7)
Neutrophils Relative %: 69 %
Platelets: 168 10*3/uL (ref 150–400)
RBC: 4.75 MIL/uL (ref 4.22–5.81)
RDW: 12.2 % (ref 11.5–15.5)
WBC: 6.6 10*3/uL (ref 4.0–10.5)
nRBC: 0 % (ref 0.0–0.2)

## 2021-04-18 LAB — COMPREHENSIVE METABOLIC PANEL
ALT: 15 U/L (ref 0–44)
AST: 19 U/L (ref 15–41)
Albumin: 3.6 g/dL (ref 3.5–5.0)
Alkaline Phosphatase: 61 U/L (ref 38–126)
Anion gap: 7 (ref 5–15)
BUN: 17 mg/dL (ref 8–23)
CO2: 26 mmol/L (ref 22–32)
Calcium: 9.1 mg/dL (ref 8.9–10.3)
Chloride: 104 mmol/L (ref 98–111)
Creatinine, Ser: 1.02 mg/dL (ref 0.61–1.24)
GFR, Estimated: >60 mL/min (ref >60)
Glucose, Bld: 146 mg/dL — ABNORMAL HIGH (ref 70–99)
Potassium: 3.6 mmol/L (ref 3.5–5.1)
Sodium: 137 mmol/L (ref 135–145)
Total Bilirubin: 0.7 mg/dL (ref 0.3–1.2)
Total Protein: 6.8 g/dL (ref 6.5–8.1)

## 2021-04-18 LAB — MAGNESIUM: Magnesium: 1.9 mg/dL (ref 1.7–2.4)

## 2021-04-18 MED ORDER — SODIUM CHLORIDE 0.9 % IV SOLN
150.0000 mg | Freq: Once | INTRAVENOUS | Status: AC
  Administered 2021-04-18: 150 mg via INTRAVENOUS
  Filled 2021-04-18: qty 150

## 2021-04-18 MED ORDER — SODIUM CHLORIDE 0.9% FLUSH
10.0000 mL | INTRAVENOUS | Status: DC | PRN
  Administered 2021-04-18: 10 mL
  Filled 2021-04-18: qty 10

## 2021-04-18 MED ORDER — PALONOSETRON HCL INJECTION 0.25 MG/5ML
0.2500 mg | Freq: Once | INTRAVENOUS | Status: AC
  Administered 2021-04-18: 0.25 mg via INTRAVENOUS
  Filled 2021-04-18: qty 5

## 2021-04-18 MED ORDER — SODIUM CHLORIDE 0.9 % IV SOLN
10.0000 mg | Freq: Once | INTRAVENOUS | Status: AC
  Administered 2021-04-18: 10 mg via INTRAVENOUS
  Filled 2021-04-18: qty 10

## 2021-04-18 MED ORDER — SODIUM CHLORIDE 0.9 % IV SOLN
Freq: Once | INTRAVENOUS | Status: AC
  Filled 2021-04-18: qty 250

## 2021-04-18 MED ORDER — MAGNESIUM SULFATE 2 GM/50ML IV SOLN
2.0000 g | Freq: Once | INTRAVENOUS | Status: AC
  Administered 2021-04-18: 2 g via INTRAVENOUS
  Filled 2021-04-18: qty 50

## 2021-04-18 MED ORDER — HEPARIN SOD (PORK) LOCK FLUSH 100 UNIT/ML IV SOLN
500.0000 [IU] | Freq: Once | INTRAVENOUS | Status: AC | PRN
  Filled 2021-04-18: qty 5

## 2021-04-18 MED ORDER — SODIUM CHLORIDE 0.9 % IV SOLN
40.0000 mg/m2 | Freq: Once | INTRAVENOUS | Status: AC
  Administered 2021-04-18: 82 mg via INTRAVENOUS
  Filled 2021-04-18: qty 82

## 2021-04-18 MED ORDER — POTASSIUM CHLORIDE IN NACL 20-0.9 MEQ/L-% IV SOLN
Freq: Once | INTRAVENOUS | Status: AC
  Filled 2021-04-18: qty 1000

## 2021-04-18 MED ORDER — HEPARIN SOD (PORK) LOCK FLUSH 100 UNIT/ML IV SOLN
INTRAVENOUS | Status: AC
  Administered 2021-04-18: 500 [IU]
  Filled 2021-04-18: qty 5

## 2021-04-18 NOTE — Progress Notes (Signed)
[place] NOTE  Patient Care Team: [facility] as PCP - General Rogue Bussing, Elisha Headland, MD as Consulting Physician (Oncology) Noreene Filbert, MD as Consulting Physician (Radiation Oncology) Clyde Canterbury, MD as Referring Physician (Otolaryngology)  CHIEF COMPLAINTS/PURPOSE OF CONSULTATION: head and neck cancer  #  Oncology History Overview Note  DIAGNOSIS:  A. PIRIFORM SINUS LESION, LEFT; BIOPSY:  - INVASIVE SQUAMOUS CELL CARCINOMA, KERATINIZING.   Asymmetric soft tissue thickening and enhancement of the left aryepiglottic fold and adjacent paraglottic fat suspicious for malignancy. Abnormal right level 1/2 and left level 2 likely cystic/necrotic nodes.   A subcentimeter low-density lesion of the right vallecula probably reflects a cyst but direct visual inspection is recommended.  # JAN 2023-Pyriform sinus- SCC-[Dr.Bennett]clinically T1 [~1cm; N2a]; PET scan Jan 19th, 2023- The mucosal thickening along the left piriform sinus is highlyHypermetabolic'  The contralateral right level IIa lymph node is mildly hypermetabolic along its more solid-appearing anterior inferior border, maximum SUV 4.0. The left level IIa lymph node has activity less than blood pool, although its partially cystic appearance was still unusual and suspicious.   # FEB 6th, 2023- Cisplatin-RT        Cancer of pyriform sinus ([facility])  03/30/2021 Initial Diagnosis   Cancer of pyriform sinus ([place])   04/18/2021 -  Chemotherapy   Patient is on Treatment Plan : HEAD/NECK Cisplatin q7d      Cancer Staging   Cancer Staging  No matching staging information was found for the patient.   04/18/2021 Cancer Staging   Staging form: Pharynx - Hypopharynx, AJCC 8th Edition - Clinical: Stage IVA (cT1, cN2c, cM0) - Signed by Cammie Sickle, MD on 04/18/2021       HISTORY OF PRESENTING ILLNESS:  Travis Arellano 76 y.o.  male newly diagnosed pyriform sinus  cancer/locally advanced is here for follow-up//review the results of the PET scan.  Patient denies any new symptoms other than continued hoarseness of voice.  He continues to complain of soreness in his mouth especially on swallowing.  Denies new shortness of breath or cough.  Review of Systems  Constitutional:  Negative for chills, diaphoresis, fever, malaise/fatigue and weight loss.  HENT:  Negative for nosebleeds and sore throat.   Eyes:  Negative for double vision.  Respiratory:  Negative for cough, hemoptysis, sputum production, shortness of breath and wheezing.   Cardiovascular:  Negative for chest pain, palpitations, orthopnea and leg swelling.  Gastrointestinal:  Negative for abdominal pain, blood in stool, constipation, diarrhea, heartburn, melena, nausea and vomiting.  Genitourinary:  Negative for dysuria, frequency and urgency.  Musculoskeletal:  Negative for back pain and joint pain.  Skin: Negative.  Negative for itching and rash.  Neurological:  Negative for dizziness, tingling, focal weakness, weakness and headaches.  Endo/Heme/Allergies:  Does not bruise/bleed easily.  Psychiatric/Behavioral:  Negative for depression. The patient is not nervous/anxious and does not have insomnia.     MEDICAL HISTORY:  Past Medical History:  Diagnosis Date   Cancer of pyriform sinus (HCC)    High cholesterol    Hypertension    Wears dentures    Full upper, partial lower    SURGICAL HISTORY: Past Surgical History:  Procedure Laterality Date   CATARACT EXTRACTION W/PHACO Right 05/17/2020   Procedure: CATARACT EXTRACTION PHACO AND INTRAOCULAR LENS PLACEMENT (IOC);  Surgeon: Baruch Goldmann, MD;  Location: [facility];  Service: Ophthalmology;  Laterality: Right;  CDE: 15.03   CATARACT EXTRACTION W/PHACO Left 06/11/2020   Procedure: CATARACT EXTRACTION  PHACO AND INTRAOCULAR LENS PLACEMENT (IOC);  Surgeon: Baruch Goldmann, MD;  Location: [facility];  Service: Ophthalmology;  Laterality: Left;  CDE: 13.98    COLONOSCOPY     IR IMAGING GUIDED PORT INSERTION  04/05/2021   MICROLARYNGOSCOPY N/A 03/22/2021   Procedure: MICROSCOPIC DIRECT LARYNGOSCOPY WITH BIOPSY;  Surgeon: Clyde Canterbury, MD;  Location: [facility];  Service: ENT;  Laterality: N/A;   RIGID ESOPHAGOSCOPY N/A 03/22/2021   Procedure: RIGID ESOPHAGOSCOPY;  Surgeon: Clyde Canterbury, MD;  Location: [place];  Service: ENT;  Laterality: N/A;    SOCIAL HISTORY: Social History   Socioeconomic History   Marital status: Divorced    Spouse name: Not on file   Number of children: Not on file   Years of education: Not on file   Highest education level: Not on file  Occupational History   Not on file  Tobacco Use   Smoking status: Former    Types: Cigarettes    Quit date: 1999    Years since quitting: 24.1   Smokeless tobacco: Never  Vaping Use   Vaping Use: Never used  Substance and Sexual Activity   Alcohol use: No   Drug use: No   Sexual activity: Not on file  Other Topics Concern   Not on file  Social History Narrative   Lives between [place] [[address] mins [address]]; with son and brother. Quit smoking in 1999 [prior all life]; quit alcohol 30 years- used to drink beer. Retd/ Part time- auto-mechanic work.    Social Determinants of Health   Financial Resource Strain: Not on file  Food Insecurity: Not on file  Transportation Needs: Not on file  Physical Activity: Not on file  Stress: Not on file  Social Connections: Not on file  Intimate Partner Violence: Not on file    FAMILY HISTORY: Family History  Problem Relation Age of Onset   Congestive Heart Failure Mother    Diabetes Father    Lung cancer Brother     ALLERGIES:  has No Known Allergies.  MEDICATIONS:  Current Outpatient Medications  Medication Sig Dispense Refill   doxazosin (CARDURA) 4 MG tablet Take 4 mg by mouth daily.     hydrochlorothiazide (MICROZIDE) 12.5 MG capsule Take 12.5 mg by mouth daily.     lidocaine-prilocaine  (EMLA) cream Apply 1 application topically as needed. Apply to port and cover with saran wrap 1-2 hours prior to port access 30 g 1   lisinopril (PRINIVIL,ZESTRIL) 20 MG tablet Take 20 mg by mouth at bedtime.     metoprolol succinate (TOPROL-XL) 100 MG 24 hr tablet Take 100 mg by mouth daily.     ondansetron (ZOFRAN) 8 MG tablet Take 1 tablet (8 mg total) by mouth every 8 (eight) hours as needed for nausea or vomiting (start 3 days; after chemo). 40 tablet 1   pravastatin (PRAVACHOL) 40 MG tablet Take 40 mg by mouth at bedtime.     prochlorperazine (COMPAZINE) 10 MG tablet Take 1 tablet (10 mg total) by mouth every 6 (six) hours as needed for nausea or vomiting. 40 tablet 1   HYDROcodone-acetaminophen (HYCET) 7.5-325 mg/15 ml solution 10-15 cc PO every 4-6 hours as needed for pain (Patient not taking: Reported on 04/18/2021) 300 mL 0   No current facility-administered medications for this visit.   Facility-Administered Medications Ordered in Other Visits  Medication Dose Route Frequency Provider Last Rate Last Admin   sodium chloride flush (NS) 0.9 % injection 10 mL  10 mL Intracatheter  PRN Cammie Sickle, MD   10 mL at 04/18/21 0949     PHYSICAL EXAMINATION: ECOG PERFORMANCE STATUS: 1 - Symptomatic but completely ambulatory  Vitals:   04/18/21 0834  BP: 134/85  Pulse: 67  Temp: (!) 97.3 F (36.3 C)  SpO2: 100%   Filed Weights   04/18/21 0834  Weight: 186 lb 14.4 oz (84.8 kg)    Physical Exam Vitals and nursing note reviewed.  HENT:     Head: Normocephalic and atraumatic.     Mouth/Throat:     Pharynx: Oropharynx is clear.  Eyes:     Extraocular Movements: Extraocular movements intact.     Pupils: Pupils are equal, round, and reactive to light.  Cardiovascular:     Rate and Rhythm: Normal rate and regular rhythm.  Pulmonary:     Comments: Decreased breath sounds bilaterally.  Abdominal:     Palpations: Abdomen is soft.  Musculoskeletal:        General: Normal  range of motion.     Cervical back: Normal range of motion.  Skin:    General: Skin is warm.  Neurological:     General: No focal deficit present.     Mental Status: He is alert and oriented to person, place, and time.  Psychiatric:        Behavior: Behavior normal.        Judgment: Judgment normal.     LABORATORY DATA:  I have reviewed the data as listed Lab Results  Component Value Date   WBC 6.6 04/18/2021   HGB 14.4 04/18/2021   HCT 41.6 04/18/2021   MCV 87.6 04/18/2021   PLT 168 04/18/2021   Recent Labs    03/18/21 1340 03/30/21 1139 04/18/21 0759  NA  --  139 137  K  --  4.2 3.6  CL  --  105 104  CO2  --  25 26  GLUCOSE  --  123* 146*  BUN  --  16 17  CREATININE 1.10 0.99 1.02  CALCIUM  --  8.8* 9.1  GFRNONAA  --  >60 >60  PROT  --  6.7 6.8  ALBUMIN  --  3.7 3.6  AST  --  17 19  ALT  --  16 15  ALKPHOS  --  73 61  BILITOT  --  0.7 0.7    RADIOGRAPHIC STUDIES: I have personally reviewed the radiological images as listed and agreed with the findings in the report. NM PET Image Initial (PI) Skull Base To Thigh (F-18 FDG)  Result Date: 03/31/2021 CLINICAL DATA:  Initial treatment strategy for head and neck cancer involving the piriform sinus. EXAM: NUCLEAR MEDICINE PET SKULL BASE TO THIGH TECHNIQUE: 10.0 mCi F-18 FDG was injected intravenously. Full-ring PET imaging was performed from the skull base to thigh after the radiotracer. CT data was obtained and used for attenuation correction and anatomic localization. Fasting blood glucose: 109 mg/dl COMPARISON:  03/18/2021 FINDINGS: Mediastinal blood pool activity: SUV max 3.0 Liver activity: SUV max NA NECK: Soft tissue thickening along the mucosa of the left piriform sinus is associated hypermetabolic activity, maximum SUV 10.5 compatible with malignancy. The contralateral (right-sided) level IIa lymph node measures 1.5 cm in short axis on image 60 of series 3, with accentuated activity primarily along the anterior  inferior border (where there is greater degree of enhancement on prior CT) and maximum SUV of 4.0. A left level IIa lymph node measuring 0.7 cm in short axis on image 56 series 3 a  maximum SUV of 2.5, below blood pool. On the prior CT there is fluid density in the right vallecula, this is less apparent on today's exam although the vallecular are effaced. No hypermetabolic activity in the tongue base/vallecula. Incidental CT findings: Mild chronic bilateral maxillary sinusitis and chronic right sphenoid sinusitis. Bilateral common carotid atherosclerotic calcification. CHEST: No significant abnormal hypermetabolic activity in this region. Incidental CT findings: Coronary, aortic arch, and branch vessel atherosclerotic vascular disease. Small type 1 hiatal hernia. Small thyroid nodules up to 0.9 cm in long axis are not hypermetabolic. Biapical pleuroparenchymal scarring. Paraseptal emphysema. Mild bibasilar scarring. Small nodules are present in the right lower lobe, 1 of largest measuring 6 by 4 mm on image 155 series 3. No associated hypermetabolic activity but these nodules are below sensitive PET-CT size thresholds. ABDOMEN/PELVIS: No significant abnormal hypermetabolic activity in this region. Incidental CT findings: Atherosclerosis is present, including aortoiliac atherosclerotic disease. Small umbilical hernia contains adipose tissue. Suspected right kidney lower pole parapelvic cyst. Mild prostatomegaly. SKELETON: No significant abnormal hypermetabolic activity in this region. Incidental CT findings: Mild thoracic spondylosis. IMPRESSION: 1. The mucosal thickening along the left piriform sinus is highly hypermetabolic with maximum SUV 10.5, compatible with malignancy. 2. The contralateral right level IIa lymph node is mildly hypermetabolic along its more solid-appearing anterior inferior border, maximum SUV 4.0. The left level IIa lymph node has activity less than blood pool, although its partially cystic  appearance was still unusual and suspicious in this context. 3. No specific hypermetabolic activity or substantial abnormality along the right tongue base/vallecula currently identified. 4. Subcentimeter incidental thyroid nodule. No follow-up imaging is recommended. Reference: J Am Coll Radiol. 2015 Feb;12(2): 143-50 5. Other imaging findings of potential clinical significance: Chronic paranasal sinusitis. Aortic Atherosclerosis (ICD10-I70.0) and Emphysema (ICD10-J43.9). Coronary atherosclerosis. Mild prostatomegaly. Electronically Signed   By: Van Clines M.D.   On: 03/31/2021 15:24   IR IMAGING GUIDED PORT INSERTION  Result Date: 04/05/2021 CLINICAL DATA:  Metastatic head and neck cancer, access for chemotherapy EXAM: RIGHT INTERNAL JUGULAR SINGLE LUMEN POWER PORT CATHETER INSERTION Date:  04/05/2021 04/05/2021 11:58 am Radiologist:  M. Daryll Brod, MD Guidance:  Ultrasound and fluoroscopic MEDICATIONS: 1% lidocaine with epinephrine local ANESTHESIA/SEDATION: Versed 2.0 mg IV; Fentanyl 100 mcg IV; Moderate Sedation Time:  31 minutes The patient was continuously monitored during the procedure by the interventional radiology nurse under my direct supervision. FLUOROSCOPY TIME:  0 minutes, 48 seconds (99.2 mGy) COMPLICATIONS: None immediate. CONTRAST:  None. PROCEDURE: Informed consent was obtained from the patient following explanation of the procedure, risks, benefits and alternatives. The patient understands, agrees and consents for the procedure. All questions were addressed. A time out was performed. Maximal barrier sterile technique utilized including caps, mask, sterile gowns, sterile gloves, large sterile drape, hand hygiene, and 2% chlorhexidine scrub. Under sterile conditions and local anesthesia, right internal jugular micropuncture venous access was performed. Access was performed with ultrasound. Images were obtained for documentation of the patent right internal jugular vein. A guide wire was  inserted followed by a transitional dilator. This allowed insertion of a guide wire and catheter into the IVC. Measurements were obtained from the SVC / RA junction back to the right IJ venotomy site. In the right infraclavicular chest, a subcutaneous pocket was created over the second anterior rib. This was done under sterile conditions and local anesthesia. 1% lidocaine with epinephrine was utilized for this. A 2.5 cm incision was made in the skin. Blunt dissection was performed to create a subcutaneous pocket  over the right pectoralis major muscle. The pocket was flushed with saline vigorously. There was adequate hemostasis. The port catheter was assembled and checked for leakage. The port catheter was secured in the pocket with two retention sutures. The tubing was tunneled subcutaneously to the right venotomy site and inserted into the SVC/RA junction through a valved peel-away sheath. Position was confirmed with fluoroscopy. Images were obtained for documentation. The patient tolerated the procedure well. No immediate complications. Incisions were closed in a two layer fashion with 4 - 0 Vicryl suture. Dermabond was applied to the skin. The port catheter was accessed, blood was aspirated followed by saline and heparin flushes. Needle was removed. A dry sterile dressing was applied. IMPRESSION: Ultrasound and fluoroscopically guided right internal jugular single lumen power port catheter insertion. Tip in the SVC/RA junction. Catheter ready for use. Electronically Signed   By: Jerilynn Mages.  Shick M.D.   On: 04/05/2021 12:55    ASSESSMENT & PLAN:   Cancer of pyriform sinus (HCC) # Pyriform sinus- SCC-clinically T1 [~1cm; N2a]; Stage IVA- PET scan Jan 19th, 2023- The mucosal thickening along the left piriform sinus is highlyHypermetabolic'  The contralateral right level IIa lymph node is mildly hypermetabolic along its more solid-appearing anterior inferior border, maximum SUV 4.0. The left level IIa lymph node has  activity less than blood pool, although its partially cystic appearance was still unusual and suspicious.   #Proceed with cisplatin weekly along with radiation [2/06-3/24].  Cycle #1 today.  Again reviewed the potential side effects including but not limited to nausea vomiting worsening difficulty swallowing weight loss renal dysfunction neuropathy etc.  # Throat pain- sec to malignancy- recommend Tyelnol 2 pills ; q 8 hour prn]; avoid Advil/Motrin. If not improved narcotics  #Risk of weight loss / difficulty swallowing-counseled the patient regarding potential weight loss; moderate to severe mucositis from chemoradiation.  Hold off PEG tube placement.  Awaiting Joli eval on 2/08.   # GBG- 146; [non-diabetic]; check HbA1c- next visit; monitor BG closely n chemo  # IV access/Mediport-s/p mediport-  # DISPOSITION: # chemo today. # labs/chemo next week as planned-  # follow up in 2 weeks- MD: labs- cbc/cmp/mag;HbA1c- cisplatin weekly-Dr.B      All questions were answered. The patient knows to call the clinic with any problems, questions or concerns.       Cammie Sickle, MD 04/18/2021 4:29 PM

## 2021-04-18 NOTE — Assessment & Plan Note (Addendum)
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 The left level IIa lymph node has activity less than blood pool, although its partially cystic appearance was still unusual and suspicious.   #Proceed with cisplatin weekly along with radiation [2/06-3/24].  Cycle #1 today.  Again reviewed the potential side effects including but not limited to nausea vomiting worsening difficulty swallowing weight loss renal dysfunction neuropathy etc.  # Throat pain- sec to malignancy- recommend Tyelnol 2 pills ; q 8 hour prn]; avoid Advil/Motrin. If not improved narcotics  #Risk of weight loss / difficulty swallowing-counseled the patient regarding potential weight loss; moderate to severe mucositis from chemoradiation.  Hold off PEG tube placement.  Awaiting Joli eval on 2/08.   # GBG- 146; [non-diabetic]; check HbA1c- next visit; monitor BG closely n chemo  # IV access/Mediport-s/p mediport-  # DISPOSITION: # chemo today. # labs/chemo next week as planned-  # follow up in 2 weeks- MD: labs- cbc/cmp/mag;HbA1c- cisplatin weekly-Dr.B

## 2021-04-18 NOTE — Progress Notes (Signed)
Pt states it's hard to eat due to sore throat. Has had a headache for 2 weeks.  He needs clarification of weather he can have ibuprofen or not.

## 2021-04-18 NOTE — Patient Instructions (Signed)
#  Avoid Motrin/Advil; instead try Tylenol 2 pills every 6-8 hours as needed for pain.  If not improved recommend stronger pain medication/prescription strength..  Evaluate

## 2021-04-18 NOTE — Patient Instructions (Signed)
[facility]   Discharge Instructions: Thank you for choosing [facility] to provide your oncology and hematology care.  If you have a lab appointment with the [facility], please go directly to the [facility] and check in at the registration area.   Wear comfortable clothing and clothing appropriate for easy access to any Portacath or PICC line.   We strive to give you quality time with your provider. You may need to reschedule your appointment if you arrive late (15 or more minutes).  Arriving late affects you and other patients whose appointments are after yours.  Also, if you miss three or more appointments without notifying the office, you may be dismissed from the clinic at the providers discretion.      For prescription refill requests, have your pharmacy contact our office and allow 72 hours for refills to be completed.    Today you received the following chemotherapy and/or immunotherapy agents: Cisplatin.      To help prevent nausea and vomiting after your treatment, we encourage you to take your nausea medication as directed.  BELOW ARE SYMPTOMS THAT SHOULD BE REPORTED IMMEDIATELY: *FEVER GREATER THAN 100.4 F (38 C) OR HIGHER *CHILLS OR SWEATING *NAUSEA AND VOMITING THAT IS NOT CONTROLLED WITH YOUR NAUSEA MEDICATION *UNUSUAL SHORTNESS OF BREATH *UNUSUAL BRUISING OR BLEEDING *URINARY PROBLEMS (pain or burning when urinating, or frequent urination) *BOWEL PROBLEMS (unusual diarrhea, constipation, pain near the anus) TENDERNESS IN MOUTH AND THROAT WITH OR WITHOUT PRESENCE OF ULCERS (sore throat, sores in mouth, or a toothache) UNUSUAL RASH, SWELLING OR PAIN  UNUSUAL VAGINAL DISCHARGE OR ITCHING   Items with * indicate a potential emergency and should be followed up as soon as possible or go to the Emergency Department if any problems should occur.  Please show the CHEMOTHERAPY ALERT CARD or IMMUNOTHERAPY ALERT CARD at check-in  to the Emergency Department and triage nurse.  Should you have questions after your visit or need to cancel or reschedule your appointment, please contact [facility]  Dept: [phone]  and follow the prompts.  Office hours are 8:00 a.m. to 4:30 p.m. Monday - Friday. Please note that voicemails left after 4:00 p.m. may not be returned until the following business day.  We are closed weekends and major holidays. You have access to a nurse at all times for urgent questions. Please call the main number to the clinic Dept: [phone] and follow the prompts.  For any non-urgent questions, you may also contact your provider using MyChart. We now offer e-Visits for anyone 8 and older to request care online for non-urgent symptoms. For details visit [email or web address].   Also download the MyChart app! Go to the app store, search "MyChart", open the app, select [facility], and log in with your MyChart username and password.  Due to Covid, a mask is required upon entering the hospital/clinic. If you do not have a mask, one will be given to you upon arrival. For doctor visits, patients may have 1 support person aged 84 or older with them. For treatment visits, patients cannot have anyone with them due to current Covid guidelines and our immunocompromised population.

## 2021-04-19 ENCOUNTER — Ambulatory Visit
Admission: RE | Admit: 2021-04-19 | Discharge: 2021-04-19 | Disposition: A | Discharge status: Home / Self Care | Payer: Medicare Other | Source: Ambulatory Visit | Attending: Radiation Oncology | Admitting: Radiation Oncology

## 2021-04-19 DIAGNOSIS — C12 Malignant neoplasm of pyriform sinus: Secondary | ICD-10-CM | POA: Diagnosis not present

## 2021-04-19 DIAGNOSIS — Z510 Encounter for antineoplastic radiation therapy: Secondary | ICD-10-CM | POA: Diagnosis not present

## 2021-04-20 ENCOUNTER — Ambulatory Visit
Admission: RE | Admit: 2021-04-20 | Discharge: 2021-04-20 | Disposition: A | Discharge status: Home / Self Care | Payer: Medicare Other | Source: Ambulatory Visit | Attending: Radiation Oncology | Admitting: Radiation Oncology

## 2021-04-20 ENCOUNTER — Other Ambulatory Visit: Payer: Self-pay

## 2021-04-20 ENCOUNTER — Inpatient Hospital Stay: Payer: Medicare Other

## 2021-04-20 DIAGNOSIS — C12 Malignant neoplasm of pyriform sinus: Secondary | ICD-10-CM | POA: Diagnosis not present

## 2021-04-20 DIAGNOSIS — Z510 Encounter for antineoplastic radiation therapy: Secondary | ICD-10-CM | POA: Diagnosis not present

## 2021-04-20 NOTE — Progress Notes (Addendum)
Nutrition Assessment:  Referral from Dr B for new head and neck cancer.  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  Past medical history of HLD, HTN.  Patient has started concurrent chemotherapy and radiation.    Met with patient following radiation.  Patient reports that he has a good appetite but throat is sore when swallowing.  Denies getting choked or coughing during drinking or eating solid foods.  Typcially for breakfast may have toast but most days does not eat any breakfast.  Lunch is usually a sandwich.  Supper maybe another sandwich or soup.  Lives with son and brother.  Has not tried oral nutrition supplements.      Medications: zofran, compazine  Labs: glucose 146 (2/6)  Anthropometrics:   Height: 70 inches Weight: 186 lb 14.4 oz on 2/6 UBW: 190s BMI: 26  194 lb 3.2 oz on 1/27 (?? Maybe wearing a coat all other weights 187-190 lb   2% weight loss in the last month, concerning (190 lb used)  Patient verbalizes he wouldn't mind loosing some weight.  Estimated Energy Needs  Kcals: 2100-2500 Protein: 105-125 g Fluid: 2100-2500 L  NUTRITION DIAGNOSIS: Inadequate oral intake related to cancer and cancer related treatment side effects (painful swallowing) as evidenced by 2% weight loss in the last month.      INTERVENTION:  Discussed importance of nutrition and weight maintenance during treatment.  Discussed ways soft, moist foods for ease of swallowing in increase calories and protein. Handout provided  Samples of ensure complete, ensure plus given to patient along with coupons.   Contact information provided     MONITORING, EVALUATION, GOAL: weight trends, intake   NEXT VISIT: Thursday, Feb 16 after radiation  Jennaya Pogue [name], [place], [place] Registered Dietitian [phone] (mobile)

## 2021-04-21 ENCOUNTER — Ambulatory Visit
Admission: RE | Admit: 2021-04-21 | Discharge: 2021-04-21 | Disposition: A | Discharge status: Home / Self Care | Payer: Medicare Other | Source: Ambulatory Visit | Attending: Radiation Oncology | Admitting: Radiation Oncology

## 2021-04-21 DIAGNOSIS — C12 Malignant neoplasm of pyriform sinus: Secondary | ICD-10-CM | POA: Diagnosis not present

## 2021-04-21 DIAGNOSIS — Z510 Encounter for antineoplastic radiation therapy: Secondary | ICD-10-CM | POA: Diagnosis not present

## 2021-04-22 ENCOUNTER — Telehealth: Payer: Self-pay | Admitting: *Deleted

## 2021-04-22 ENCOUNTER — Ambulatory Visit
Admission: RE | Admit: 2021-04-22 | Discharge: 2021-04-22 | Disposition: A | Discharge status: Home / Self Care | Payer: Medicare Other | Source: Ambulatory Visit | Attending: Radiation Oncology | Admitting: Radiation Oncology

## 2021-04-22 DIAGNOSIS — Z510 Encounter for antineoplastic radiation therapy: Secondary | ICD-10-CM | POA: Diagnosis not present

## 2021-04-22 DIAGNOSIS — C12 Malignant neoplasm of pyriform sinus: Secondary | ICD-10-CM | POA: Diagnosis not present

## 2021-04-22 NOTE — Telephone Encounter (Signed)
Contacted patient to see how he is feeling s/p new start chemotherapy treatment. (Cisplatin). Left vm that I was calling to check in with him after his first treatment. I asked the patient to call our office back if he should have any questions or concerns.

## 2021-04-25 ENCOUNTER — Ambulatory Visit
Admission: RE | Admit: 2021-04-25 | Discharge: 2021-04-25 | Disposition: A | Discharge status: Home / Self Care | Payer: Medicare Other | Source: Ambulatory Visit | Attending: Radiation Oncology | Admitting: Radiation Oncology

## 2021-04-25 ENCOUNTER — Inpatient Hospital Stay (HOSPITAL_BASED_OUTPATIENT_CLINIC_OR_DEPARTMENT_OTHER): Payer: Medicare Other | Admitting: Hospice and Palliative Medicine

## 2021-04-25 ENCOUNTER — Inpatient Hospital Stay: Payer: Medicare Other

## 2021-04-25 ENCOUNTER — Other Ambulatory Visit: Payer: Self-pay

## 2021-04-25 VITALS — Ht 70.0 in | Wt 182.6 lb

## 2021-04-25 DIAGNOSIS — Z510 Encounter for antineoplastic radiation therapy: Secondary | ICD-10-CM | POA: Diagnosis not present

## 2021-04-25 DIAGNOSIS — C12 Malignant neoplasm of pyriform sinus: Secondary | ICD-10-CM

## 2021-04-25 LAB — CBC WITH DIFFERENTIAL/PLATELET
Abs Immature Granulocytes: 0.05 10*3/uL (ref 0.00–0.07)
Basophils Absolute: 0 10*3/uL (ref 0.0–0.1)
Basophils Relative: 0 %
Eosinophils Absolute: 0.1 10*3/uL (ref 0.0–0.5)
Eosinophils Relative: 1 %
HCT: 41.1 % (ref 39.0–52.0)
Hemoglobin: 14 g/dL (ref 13.0–17.0)
Immature Granulocytes: 1 %
Lymphocytes Relative: 9 %
Lymphs Abs: 0.8 10*3/uL (ref 0.7–4.0)
MCH: 29.8 pg (ref 26.0–34.0)
MCHC: 34.1 g/dL (ref 30.0–36.0)
MCV: 87.4 fL (ref 80.0–100.0)
Monocytes Absolute: 0.7 10*3/uL (ref 0.1–1.0)
Monocytes Relative: 9 %
Neutro Abs: 6.7 10*3/uL (ref 1.7–7.7)
Neutrophils Relative %: 80 %
Platelets: 192 10*3/uL (ref 150–400)
RBC: 4.7 MIL/uL (ref 4.22–5.81)
RDW: 11.8 % (ref 11.5–15.5)
WBC: 8.5 10*3/uL (ref 4.0–10.5)
nRBC: 0 % (ref 0.0–0.2)

## 2021-04-25 LAB — COMPREHENSIVE METABOLIC PANEL
ALT: 14 U/L (ref 0–44)
AST: 17 U/L (ref 15–41)
Albumin: 3.6 g/dL (ref 3.5–5.0)
Alkaline Phosphatase: 62 U/L (ref 38–126)
Anion gap: 8 (ref 5–15)
BUN: 15 mg/dL (ref 8–23)
CO2: 25 mmol/L (ref 22–32)
Calcium: 8.8 mg/dL — ABNORMAL LOW (ref 8.9–10.3)
Chloride: 99 mmol/L (ref 98–111)
Creatinine, Ser: 0.95 mg/dL (ref 0.61–1.24)
GFR, Estimated: >60 mL/min (ref >60)
Glucose, Bld: 156 mg/dL — ABNORMAL HIGH (ref 70–99)
Potassium: 3.4 mmol/L — ABNORMAL LOW (ref 3.5–5.1)
Sodium: 132 mmol/L — ABNORMAL LOW (ref 135–145)
Total Bilirubin: 0.7 mg/dL (ref 0.3–1.2)
Total Protein: 6.9 g/dL (ref 6.5–8.1)

## 2021-04-25 LAB — MAGNESIUM: Magnesium: 1.8 mg/dL (ref 1.7–2.4)

## 2021-04-25 MED ORDER — POTASSIUM CHLORIDE IN NACL 20-0.9 MEQ/L-% IV SOLN
Freq: Once | INTRAVENOUS | Status: AC
  Filled 2021-04-25: qty 1000

## 2021-04-25 MED ORDER — HEPARIN SOD (PORK) LOCK FLUSH 100 UNIT/ML IV SOLN
500.0000 [IU] | Freq: Once | INTRAVENOUS | Status: AC | PRN
  Administered 2021-04-25: 500 [IU]
  Filled 2021-04-25: qty 5

## 2021-04-25 MED ORDER — PALONOSETRON HCL INJECTION 0.25 MG/5ML
0.2500 mg | Freq: Once | INTRAVENOUS | Status: AC
  Administered 2021-04-25: 0.25 mg via INTRAVENOUS
  Filled 2021-04-25: qty 5

## 2021-04-25 MED ORDER — SODIUM CHLORIDE 0.9 % IV SOLN
150.0000 mg | Freq: Once | INTRAVENOUS | Status: AC
  Administered 2021-04-25: 150 mg via INTRAVENOUS
  Filled 2021-04-25: qty 150

## 2021-04-25 MED ORDER — MAGNESIUM SULFATE 2 GM/50ML IV SOLN
2.0000 g | Freq: Once | INTRAVENOUS | Status: AC
  Administered 2021-04-25: 2 g via INTRAVENOUS
  Filled 2021-04-25: qty 50

## 2021-04-25 MED ORDER — SODIUM CHLORIDE 0.9 % IV SOLN
40.0000 mg/m2 | Freq: Once | INTRAVENOUS | Status: AC
  Administered 2021-04-25: 82 mg via INTRAVENOUS
  Filled 2021-04-25: qty 82

## 2021-04-25 MED ORDER — SODIUM CHLORIDE 0.9 % IV SOLN
10.0000 mg | Freq: Once | INTRAVENOUS | Status: AC
  Administered 2021-04-25: 10 mg via INTRAVENOUS
  Filled 2021-04-25: qty 10

## 2021-04-25 MED ORDER — NYSTATIN 100000 UNIT/ML MT SUSP: 5.0000 mL | Freq: Four times a day (QID) | OROMUCOSAL | 1 refills | Status: DC | PRN

## 2021-04-25 MED ORDER — SODIUM CHLORIDE 0.9 % IV SOLN
Freq: Once | INTRAVENOUS | Status: AC
  Filled 2021-04-25: qty 250

## 2021-04-25 NOTE — Patient Instructions (Signed)
[facility] [place]  Discharge Instructions: Thank you for choosing [facility] to provide your oncology and hematology care.  If you have a lab appointment with the [facility], please go directly to the [facility] and check in at the registration area.  Wear comfortable clothing and clothing appropriate for easy access to any Portacath or PICC line.   We strive to give you quality time with your provider. You may need to reschedule your appointment if you arrive late (15 or more minutes).  Arriving late affects you and other patients whose appointments are after yours.  Also, if you miss three or more appointments without notifying the office, you may be dismissed from the clinic at the providers discretion.      For prescription refill requests, have your pharmacy contact our office and allow 72 hours for refills to be completed.    Today you received the following chemotherapy and/or immunotherapy agents CISPLATIN      To help prevent nausea and vomiting after your treatment, we encourage you to take your nausea medication as directed.  BELOW ARE SYMPTOMS THAT SHOULD BE REPORTED IMMEDIATELY: *FEVER GREATER THAN 100.4 F (38 C) OR HIGHER *CHILLS OR SWEATING *NAUSEA AND VOMITING THAT IS NOT CONTROLLED WITH YOUR NAUSEA MEDICATION *UNUSUAL SHORTNESS OF BREATH *UNUSUAL BRUISING OR BLEEDING *URINARY PROBLEMS (pain or burning when urinating, or frequent urination) *BOWEL PROBLEMS (unusual diarrhea, constipation, pain near the anus) TENDERNESS IN MOUTH AND THROAT WITH OR WITHOUT PRESENCE OF ULCERS (sore throat, sores in mouth, or a toothache) UNUSUAL RASH, SWELLING OR PAIN  UNUSUAL VAGINAL DISCHARGE OR ITCHING   Items with * indicate a potential emergency and should be followed up as soon as possible or go to the Emergency Department if any problems should occur.  Please show the CHEMOTHERAPY ALERT CARD or IMMUNOTHERAPY ALERT CARD at check-in to  the Emergency Department and triage nurse.  Should you have questions after your visit or need to cancel or reschedule your appointment, please contact [facility] [place] [facility] [place]  [phone] and follow the prompts.  Office hours are 8:00 a.m. to 4:30 p.m. Monday - Friday. Please note that voicemails left after 4:00 p.m. may not be returned until the following business day.  We are closed weekends and major holidays. You have access to a nurse at all times for urgent questions. Please call the main number to the clinic [phone] and follow the prompts.  For any non-urgent questions, you may also contact your provider using MyChart. We now offer e-Visits for anyone 23 and older to request care online for non-urgent symptoms. For details visit [email or web address].   Also download the MyChart app! Go to the app store, search "MyChart", open the app, select [facility], and log in with your MyChart username and password.  Due to Covid, a mask is required upon entering the hospital/clinic. If you do not have a mask, one will be given to you upon arrival. For doctor visits, patients may have 1 support person aged 16 or older with them. For treatment visits, patients cannot have anyone with them due to current Covid guidelines and our immunocompromised population.   Cisplatin injection What is this medication? CISPLATIN (SIS pla tin) is a chemotherapy drug. It targets fast dividing cells, like cancer cells, and causes these cells to die. This medicine is used to treat many types of cancer like bladder, ovarian, and testicular cancers. This medicine may be used for other purposes; ask your health  care provider or pharmacist if you have questions. COMMON BRAND NAME(S): Platinol, Platinol -AQ What should I tell my care team before I take this medication? They need to know if you have any of these conditions: eye disease, vision problems hearing problems kidney disease low blood  counts, like white cells, platelets, or red blood cells tingling of the fingers or toes, or other nerve disorder an unusual or allergic reaction to cisplatin, carboplatin, oxaliplatin, other medicines, foods, dyes, or preservatives pregnant or trying to get pregnant breast-feeding How should I use this medication? This drug is given as an infusion into a vein. It is administered in a hospital or clinic by a specially trained health care professional. Talk to your pediatrician regarding the use of this medicine in children. Special care may be needed. Overdosage: If you think you have taken too much of this medicine contact a poison control center or emergency room at once. NOTE: This medicine is only for you. Do not share this medicine with others. What if I miss a dose? It is important not to miss a dose. Call your doctor or health care professional if you are unable to keep an appointment. What may interact with this medication? This medicine may interact with the following medications: foscarnet certain antibiotics like amikacin, gentamicin, neomycin, polymyxin B, streptomycin, tobramycin, vancomycin This list may not describe all possible interactions. Give your health care provider a list of all the medicines, herbs, non-prescription drugs, or dietary supplements you use. Also tell them if you smoke, drink alcohol, or use illegal drugs. Some items may interact with your medicine. What should I watch for while using this medication? Your condition will be monitored carefully while you are receiving this medicine. You will need important blood work done while you are taking this medicine. This drug may make you feel generally unwell. This is not uncommon, as chemotherapy can affect healthy cells as well as cancer cells. Report any side effects. Continue your course of treatment even though you feel ill unless your doctor tells you to stop. This medicine may increase your risk of getting an  infection. Call your healthcare professional for advice if you get a fever, chills, or sore throat, or other symptoms of a cold or flu. Do not treat yourself. Try to avoid being around people who are sick. Avoid taking medicines that contain aspirin, acetaminophen, ibuprofen, naproxen, or ketoprofen unless instructed by your healthcare professional. These medicines may hide a fever. This medicine may increase your risk to bruise or bleed. Call your doctor or health care professional if you notice any unusual bleeding. Be careful brushing and flossing your teeth or using a toothpick because you may get an infection or bleed more easily. If you have any dental work done, tell your dentist you are receiving this medicine. Do not become pregnant while taking this medicine or for 14 months after stopping it. Women should inform their healthcare professional if they wish to become pregnant or think they might be pregnant. Men should not father a child while taking this medicine and for 11 months after stopping it. There is potential for serious side effects to an unborn child. Talk to your healthcare professional for more information. Do not breast-feed an infant while taking this medicine. This medicine has caused ovarian failure in some women. This medicine may make it more difficult to get pregnant. Talk to your healthcare professional if you are concerned about your fertility. This medicine has caused decreased sperm counts in some men.  This may make it more difficult to father a child. Talk to your healthcare professional if you are concerned about your fertility. Drink fluids as directed while you are taking this medicine. This will help protect your kidneys. Call your doctor or health care professional if you get diarrhea. Do not treat yourself. What side effects may I notice from receiving this medication? Side effects that you should report to your doctor or health care professional as soon as  possible: allergic reactions like skin rash, itching or hives, swelling of the face, lips, or tongue blurred vision changes in vision decreased hearing or ringing of the ears nausea, vomiting pain, redness, or irritation at site where injected pain, tingling, numbness in the hands or feet signs and symptoms of bleeding such as bloody or black, tarry stools; red or dark brown urine; spitting up blood or brown material that looks like coffee grounds; red spots on the skin; unusual bruising or bleeding from the eyes, gums, or nose signs and symptoms of infection like fever; chills; cough; sore throat; pain or trouble passing urine signs and symptoms of kidney injury like trouble passing urine or change in the amount of urine signs and symptoms of low red blood cells or anemia such as unusually weak or tired; feeling faint or lightheaded; falls; breathing problems Side effects that usually do not require medical attention (report to your doctor or health care professional if they continue or are bothersome): loss of appetite mouth sores muscle cramps This list may not describe all possible side effects. Call your doctor for medical advice about side effects. You may report side effects to FDA at 1-800-FDA-1088. Where should I keep my medication? This drug is given in a hospital or clinic and will not be stored at home. NOTE: This sheet is a summary. It may not cover all possible information. If you have questions about this medicine, talk to your doctor, pharmacist, or health care provider.  2022 [employer] (2020-11-16 00:00:00)

## 2021-04-25 NOTE — Progress Notes (Signed)
Symptom Management [place] at [facility] [phone] [phone]) [phone]  Patient Care Team: [facility] as PCP - General Rogue Bussing, Elisha Headland, MD as Consulting Physician (Oncology) Noreene Filbert, MD as Consulting Physician (Radiation Oncology) Clyde Canterbury, MD as Referring Physician (Otolaryngology)   Name of the patient: Travis Arellano  [id number]  08-13-1945   Date of visit: 04/25/21  Reason for Consult: Travis Arellano is a 76 y.o. 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  Patient was an add-on to [facility] today for evaluation of odynophagia.  Patient reports that he is had painful swallowing over the past week.  It hurts with swallowing saliva and consistency does not seem to affect it.  He has occasional coughing but he has been able to eat regular foods.  No fever or chills or shortness of breath.   Denies any neurologic complaints. Denies recent fevers or illnesses. Denies any easy bleeding or bruising. Reports fair appetite and denies weight loss. Denies chest pain. Denies any nausea, vomiting, constipation, or diarrhea. Denies urinary complaints. Patient offers no further specific complaints today.  PAST MEDICAL HISTORY: Past Medical History:  Diagnosis Date   Cancer of pyriform sinus (HCC)    High cholesterol    Hypertension    Wears dentures    Full upper, partial lower    PAST SURGICAL HISTORY:  Past Surgical History:  Procedure Laterality Date   CATARACT EXTRACTION W/PHACO Right 05/17/2020   Procedure: CATARACT EXTRACTION PHACO AND INTRAOCULAR LENS PLACEMENT (IOC);  Surgeon: Baruch Goldmann, MD;  Location: [facility];  Service: Ophthalmology;  Laterality: Right;  CDE: 15.03   CATARACT EXTRACTION W/PHACO Left 06/11/2020   Procedure: CATARACT EXTRACTION PHACO AND INTRAOCULAR LENS PLACEMENT (IOC);  Surgeon: Baruch Goldmann,  MD;  Location: [facility];  Service: Ophthalmology;  Laterality: Left;  CDE: 13.98   COLONOSCOPY     IR IMAGING GUIDED PORT INSERTION  04/05/2021   MICROLARYNGOSCOPY N/A 03/22/2021   Procedure: MICROSCOPIC DIRECT LARYNGOSCOPY WITH BIOPSY;  Surgeon: Clyde Canterbury, MD;  Location: [place];  Service: ENT;  Laterality: N/A;   RIGID ESOPHAGOSCOPY N/A 03/22/2021   Procedure: RIGID ESOPHAGOSCOPY;  Surgeon: Clyde Canterbury, MD;  Location: [place];  Service: ENT;  Laterality: N/A;    HEMATOLOGY/ONCOLOGY HISTORY:  Oncology History Overview Note  DIAGNOSIS:  A. PIRIFORM SINUS LESION, LEFT; BIOPSY:  - INVASIVE SQUAMOUS CELL CARCINOMA, KERATINIZING.   Asymmetric soft tissue thickening and enhancement of the left aryepiglottic fold and adjacent paraglottic fat suspicious for malignancy. Abnormal right level 1/2 and left level 2 likely cystic/necrotic nodes.   A subcentimeter low-density lesion of the right vallecula probably reflects a cyst but direct visual inspection is recommended.  # JAN 2023-Pyriform sinus- SCC-[Dr.Bennett]clinically T1 [~1cm; N2a]; PET scan Jan 19th, 2023- The mucosal thickening along the left piriform sinus is highlyHypermetabolic'  The contralateral right level IIa lymph node is mildly hypermetabolic along its more solid-appearing anterior inferior border, maximum SUV 4.0. The left level IIa lymph node has activity less than blood pool, although its partially cystic appearance was still unusual and suspicious.   # FEB 6th, 2023- Cisplatin-RT        Cancer of pyriform sinus ([place])  03/30/2021 Initial Diagnosis   Cancer of pyriform sinus ([place])   04/18/2021 -  Chemotherapy   Patient is on Treatment Plan : HEAD/NECK Cisplatin q7d      Cancer Staging   Cancer  Staging  No matching staging information was found for the patient.   04/18/2021 Cancer Staging   Staging form: Pharynx - Hypopharynx, AJCC 8th Edition - Clinical: Stage IVA (cT1, cN2c, cM0) - Signed by  Cammie Sickle, MD on 04/18/2021      ALLERGIES:  has No Known Allergies.  MEDICATIONS:  Current Outpatient Medications  Medication Sig Dispense Refill   doxazosin (CARDURA) 4 MG tablet Take 4 mg by mouth daily.     hydrochlorothiazide (MICROZIDE) 12.5 MG capsule Take 12.5 mg by mouth daily.     HYDROcodone-acetaminophen (HYCET) 7.5-325 mg/15 ml solution 10-15 cc PO every 4-6 hours as needed for pain (Patient not taking: Reported on 04/18/2021) 300 mL 0   lidocaine-prilocaine (EMLA) cream Apply 1 application topically as needed. Apply to port and cover with saran wrap 1-2 hours prior to port access 30 g 1   lisinopril (PRINIVIL,ZESTRIL) 20 MG tablet Take 20 mg by mouth at bedtime.     metoprolol succinate (TOPROL-XL) 100 MG 24 hr tablet Take 100 mg by mouth daily.     ondansetron (ZOFRAN) 8 MG tablet Take 1 tablet (8 mg total) by mouth every 8 (eight) hours as needed for nausea or vomiting (start 3 days; after chemo). 40 tablet 1   pravastatin (PRAVACHOL) 40 MG tablet Take 40 mg by mouth at bedtime.     prochlorperazine (COMPAZINE) 10 MG tablet Take 1 tablet (10 mg total) by mouth every 6 (six) hours as needed for nausea or vomiting. 40 tablet 1   No current facility-administered medications for this visit.   Facility-Administered Medications Ordered in Other Visits  Medication Dose Route Frequency Provider Last Rate Last Admin   0.9 %  sodium chloride infusion   Intravenous Once Cammie Sickle, MD       CISplatin (PLATINOL) 82 mg in sodium chloride 0.9 % 250 mL chemo infusion  40 mg/m2 (Treatment Plan Recorded) Intravenous Once Cammie Sickle, MD       dexamethasone (DECADRON) 10 mg in sodium chloride 0.9 % 50 mL IVPB  10 mg Intravenous Once Charlaine Dalton R, MD       fosaprepitant (EMEND) 150 mg in sodium chloride 0.9 % 145 mL IVPB  150 mg Intravenous Once Charlaine Dalton R, MD       heparin lock flush 100 unit/mL  500 Units Intracatheter Once PRN Cammie Sickle, MD       palonosetron (ALOXI) injection 0.25 mg  0.25 mg Intravenous Once Cammie Sickle, MD        VITAL SIGNS: There were no vitals taken for this visit. There were no vitals filed for this visit.  Estimated body mass index is 26.2 kg/m as calculated from the following:   Height as of an earlier encounter on 04/25/21: 5\' 10"  (1.778 m).   Weight as of an earlier encounter on 04/25/21: 182 lb 9.6 oz (82.8 kg).  LABS: CBC:    Component Value Date/Time   WBC 8.5 04/25/2021 0808   HGB 14.0 04/25/2021 0808   HCT 41.1 04/25/2021 0808   PLT 192 04/25/2021 0808   MCV 87.4 04/25/2021 0808   NEUTROABS 6.7 04/25/2021 0808   LYMPHSABS 0.8 04/25/2021 0808   MONOABS 0.7 04/25/2021 0808   EOSABS 0.1 04/25/2021 0808   BASOSABS 0.0 04/25/2021 0808   Comprehensive Metabolic Panel:    Component Value Date/Time   NA 132 (L) 04/25/2021 0808   K 3.4 (L) 04/25/2021 0808   CL 99 04/25/2021 9622  CO2 25 04/25/2021 0808   BUN 15 04/25/2021 0808   CREATININE 0.95 04/25/2021 0808   GLUCOSE 156 (H) 04/25/2021 0808   CALCIUM 8.8 (L) 04/25/2021 0808   AST 17 04/25/2021 0808   ALT 14 04/25/2021 0808   ALKPHOS 62 04/25/2021 0808   BILITOT 0.7 04/25/2021 0808   PROT 6.9 04/25/2021 0808   ALBUMIN 3.6 04/25/2021 0808    RADIOGRAPHIC STUDIES: NM PET Image Initial (PI) Skull Base To Thigh (F-18 FDG)  Result Date: 03/31/2021 CLINICAL DATA:  Initial treatment strategy for head and neck cancer involving the piriform sinus. EXAM: NUCLEAR MEDICINE PET SKULL BASE TO THIGH TECHNIQUE: 10.0 mCi F-18 FDG was injected intravenously. Full-ring PET imaging was performed from the skull base to thigh after the radiotracer. CT data was obtained and used for attenuation correction and anatomic localization. Fasting blood glucose: 109 mg/dl COMPARISON:  03/18/2021 FINDINGS: Mediastinal blood pool activity: SUV max 3.0 Liver activity: SUV max NA NECK: Soft tissue thickening along the mucosa of the left  piriform sinus is associated hypermetabolic activity, maximum SUV 10.5 compatible with malignancy. The contralateral (right-sided) level IIa lymph node measures 1.5 cm in short axis on image 60 of series 3, with accentuated activity primarily along the anterior inferior border (where there is greater degree of enhancement on prior CT) and maximum SUV of 4.0. A left level IIa lymph node measuring 0.7 cm in short axis on image 56 series 3 a maximum SUV of 2.5, below blood pool. On the prior CT there is fluid density in the right vallecula, this is less apparent on today's exam although the vallecular are effaced. No hypermetabolic activity in the tongue base/vallecula. Incidental CT findings: Mild chronic bilateral maxillary sinusitis and chronic right sphenoid sinusitis. Bilateral common carotid atherosclerotic calcification. CHEST: No significant abnormal hypermetabolic activity in this region. Incidental CT findings: Coronary, aortic arch, and branch vessel atherosclerotic vascular disease. Small type 1 hiatal hernia. Small thyroid nodules up to 0.9 cm in long axis are not hypermetabolic. Biapical pleuroparenchymal scarring. Paraseptal emphysema. Mild bibasilar scarring. Small nodules are present in the right lower lobe, 1 of largest measuring 6 by 4 mm on image 155 series 3. No associated hypermetabolic activity but these nodules are below sensitive PET-CT size thresholds. ABDOMEN/PELVIS: No significant abnormal hypermetabolic activity in this region. Incidental CT findings: Atherosclerosis is present, including aortoiliac atherosclerotic disease. Small umbilical hernia contains adipose tissue. Suspected right kidney lower pole parapelvic cyst. Mild prostatomegaly. SKELETON: No significant abnormal hypermetabolic activity in this region. Incidental CT findings: Mild thoracic spondylosis. IMPRESSION: 1. The mucosal thickening along the left piriform sinus is highly hypermetabolic with maximum SUV 10.5, compatible  with malignancy. 2. The contralateral right level IIa lymph node is mildly hypermetabolic along its more solid-appearing anterior inferior border, maximum SUV 4.0. The left level IIa lymph node has activity less than blood pool, although its partially cystic appearance was still unusual and suspicious in this context. 3. No specific hypermetabolic activity or substantial abnormality along the right tongue base/vallecula currently identified. 4. Subcentimeter incidental thyroid nodule. No follow-up imaging is recommended. Reference: J Am Coll Radiol. 2015 Feb;12(2): 143-50 5. Other imaging findings of potential clinical significance: Chronic paranasal sinusitis. Aortic Atherosclerosis (ICD10-I70.0) and Emphysema (ICD10-J43.9). Coronary atherosclerosis. Mild prostatomegaly. Electronically Signed   By: Van Clines M.D.   On: 03/31/2021 15:24   IR IMAGING GUIDED PORT INSERTION  Result Date: 04/05/2021 CLINICAL DATA:  Metastatic head and neck cancer, access for chemotherapy EXAM: RIGHT INTERNAL JUGULAR SINGLE LUMEN POWER PORT  CATHETER INSERTION Date:  04/05/2021 04/05/2021 11:58 am Radiologist:  M. Daryll Brod, MD Guidance:  Ultrasound and fluoroscopic MEDICATIONS: 1% lidocaine with epinephrine local ANESTHESIA/SEDATION: Versed 2.0 mg IV; Fentanyl 100 mcg IV; Moderate Sedation Time:  31 minutes The patient was continuously monitored during the procedure by the interventional radiology nurse under my direct supervision. FLUOROSCOPY TIME:  0 minutes, 48 seconds (67.1 mGy) COMPLICATIONS: None immediate. CONTRAST:  None. PROCEDURE: Informed consent was obtained from the patient following explanation of the procedure, risks, benefits and alternatives. The patient understands, agrees and consents for the procedure. All questions were addressed. A time out was performed. Maximal barrier sterile technique utilized including caps, mask, sterile gowns, sterile gloves, large sterile drape, hand hygiene, and 2%  chlorhexidine scrub. Under sterile conditions and local anesthesia, right internal jugular micropuncture venous access was performed. Access was performed with ultrasound. Images were obtained for documentation of the patent right internal jugular vein. A guide wire was inserted followed by a transitional dilator. This allowed insertion of a guide wire and catheter into the IVC. Measurements were obtained from the SVC / RA junction back to the right IJ venotomy site. In the right infraclavicular chest, a subcutaneous pocket was created over the second anterior rib. This was done under sterile conditions and local anesthesia. 1% lidocaine with epinephrine was utilized for this. A 2.5 cm incision was made in the skin. Blunt dissection was performed to create a subcutaneous pocket over the right pectoralis major muscle. The pocket was flushed with saline vigorously. There was adequate hemostasis. The port catheter was assembled and checked for leakage. The port catheter was secured in the pocket with two retention sutures. The tubing was tunneled subcutaneously to the right venotomy site and inserted into the SVC/RA junction through a valved peel-away sheath. Position was confirmed with fluoroscopy. Images were obtained for documentation. The patient tolerated the procedure well. No immediate complications. Incisions were closed in a two layer fashion with 4 - 0 Vicryl suture. Dermabond was applied to the skin. The port catheter was accessed, blood was aspirated followed by saline and heparin flushes. Needle was removed. A dry sterile dressing was applied. IMPRESSION: Ultrasound and fluoroscopically guided right internal jugular single lumen power port catheter insertion. Tip in the SVC/RA junction. Catheter ready for use. Electronically Signed   By: Jerilynn Mages.  Shick M.D.   On: 04/05/2021 12:55    PERFORMANCE STATUS (ECOG) : 1 - Symptomatic but completely ambulatory  Review of Systems Unless otherwise noted, a complete  review of systems is negative.  Physical Exam General: NAD HEENT: Erythematous OP without exudate, thrush on tongue Cardiovascular: regular rate and rhythm Pulmonary: clear ant fields Abdomen: soft, nontender, + bowel sounds GU: no suprapubic tenderness Extremities: no edema, no joint deformities Skin: no rashes Neurological: Weakness but otherwise nonfocal  Assessment and Plan- Patient is a 76 y.o. 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  We will send referral to [facility] for evaluation of swallowing.  Patient has acetaminophen for pain.  We will consider adding on opioids if needed.  He will need close follow-up with nutrition.  Patient RTC to see Dr. Rogue Bussing on 2/20 or we can see him sooner if needed   Patient expressed understanding and was in agreement with this plan. He also understands that He can call clinic at any time with any questions, concerns, or  complaints.   Thank you for allowing me to participate in the care of this very pleasant patient.   Time Total: 15 minutes  Visit consisted of counseling and education dealing with the complex and emotionally intense issues of symptom management in the setting of serious illness.Greater than 50%  of this time was spent counseling and coordinating care related to the above assessment and plan.  Signed by: Altha Harm, PhD, NP-C

## 2021-04-25 NOTE — Patient Instructions (Signed)
[facility] CANCER CTR AT [place]  Discharge Instructions: Thank you for choosing [facility] to provide your oncology and hematology care.  If you have a lab appointment with the [facility], please go directly to the [facility] and check in at the registration area.  Wear comfortable clothing and clothing appropriate for easy access to any Portacath or PICC line.   We strive to give you quality time with your provider. You may need to reschedule your appointment if you arrive late (15 or more minutes).  Arriving late affects you and other patients whose appointments are after yours.  Also, if you miss three or more appointments without notifying the office, you may be dismissed from the clinic at the providers discretion.      For prescription refill requests, have your pharmacy contact our office and allow 72 hours for refills to be completed.    Today you received the following chemotherapy and/or immunotherapy agents CISPLATIN      To help prevent nausea and vomiting after your treatment, we encourage you to take your nausea medication as directed.  BELOW ARE SYMPTOMS THAT SHOULD BE REPORTED IMMEDIATELY: *FEVER GREATER THAN 100.4 F (38 C) OR HIGHER *CHILLS OR SWEATING *NAUSEA AND VOMITING THAT IS NOT CONTROLLED WITH YOUR NAUSEA MEDICATION *UNUSUAL SHORTNESS OF BREATH *UNUSUAL BRUISING OR BLEEDING *URINARY PROBLEMS (pain or burning when urinating, or frequent urination) *BOWEL PROBLEMS (unusual diarrhea, constipation, pain near the anus) TENDERNESS IN MOUTH AND THROAT WITH OR WITHOUT PRESENCE OF ULCERS (sore throat, sores in mouth, or a toothache) UNUSUAL RASH, SWELLING OR PAIN  UNUSUAL VAGINAL DISCHARGE OR ITCHING   Items with * indicate a potential emergency and should be followed up as soon as possible or go to the Emergency Department if any problems should occur.  Please show the CHEMOTHERAPY ALERT CARD or IMMUNOTHERAPY ALERT CARD at check-in to  the Emergency Department and triage nurse.  Should you have questions after your visit or need to cancel or reschedule your appointment, please contact [facility] CANCER [place] AT [address]  [phone] and follow the prompts.  Office hours are 8:00 a.m. to 4:30 p.m. Monday - Friday. Please note that voicemails left after 4:00 p.m. may not be returned until the following business day.  We are closed weekends and major holidays. You have access to a nurse at all times for urgent questions. Please call the main number to the clinic [phone] and follow the prompts.  For any non-urgent questions, you may also contact your provider using MyChart. We now offer e-Visits for anyone 65 and older to request care online for non-urgent symptoms. For details visit [email or web address].   Also download the MyChart app! Go to the app store, search "MyChart", open the app, select [place], and log in with your MyChart username and password.  Due to Covid, a mask is required upon entering the hospital/clinic. If you do not have a mask, one will be given to you upon arrival. For doctor visits, patients may have 1 support person aged 106 or older with them. For treatment visits, patients cannot have anyone with them due to current Covid guidelines and our immunocompromised population.   Cisplatin injection What is this medication? CISPLATIN (SIS pla tin) is a chemotherapy drug. It targets fast dividing cells, like cancer cells, and causes these cells to die. This medicine is used to treat many types of cancer like bladder, ovarian, and testicular cancers. This medicine may be used for other purposes; ask your health  care provider or pharmacist if you have questions. COMMON BRAND NAME(S): Platinol, Platinol -AQ What should I tell my care team before I take this medication? They need to know if you have any of these conditions: eye disease, vision problems hearing problems kidney disease low blood  counts, like white cells, platelets, or red blood cells tingling of the fingers or toes, or other nerve disorder an unusual or allergic reaction to cisplatin, carboplatin, oxaliplatin, other medicines, foods, dyes, or preservatives pregnant or trying to get pregnant breast-feeding How should I use this medication? This drug is given as an infusion into a vein. It is administered in a hospital or clinic by a specially trained health care professional. Talk to your pediatrician regarding the use of this medicine in children. Special care may be needed. Overdosage: If you think you have taken too much of this medicine contact a poison control center or emergency room at once. NOTE: This medicine is only for you. Do not share this medicine with others. What if I miss a dose? It is important not to miss a dose. Call your doctor or health care professional if you are unable to keep an appointment. What may interact with this medication? This medicine may interact with the following medications: foscarnet certain antibiotics like amikacin, gentamicin, neomycin, polymyxin B, streptomycin, tobramycin, vancomycin This list may not describe all possible interactions. Give your health care provider a list of all the medicines, herbs, non-prescription drugs, or dietary supplements you use. Also tell them if you smoke, drink alcohol, or use illegal drugs. Some items may interact with your medicine. What should I watch for while using this medication? Your condition will be monitored carefully while you are receiving this medicine. You will need important blood work done while you are taking this medicine. This drug may make you feel generally unwell. This is not uncommon, as chemotherapy can affect healthy cells as well as cancer cells. Report any side effects. Continue your course of treatment even though you feel ill unless your doctor tells you to stop. This medicine may increase your risk of getting an  infection. Call your healthcare professional for advice if you get a fever, chills, or sore throat, or other symptoms of a cold or flu. Do not treat yourself. Try to avoid being around people who are sick. Avoid taking medicines that contain aspirin, acetaminophen, ibuprofen, naproxen, or ketoprofen unless instructed by your healthcare professional. These medicines may hide a fever. This medicine may increase your risk to bruise or bleed. Call your doctor or health care professional if you notice any unusual bleeding. Be careful brushing and flossing your teeth or using a toothpick because you may get an infection or bleed more easily. If you have any dental work done, tell your dentist you are receiving this medicine. Do not become pregnant while taking this medicine or for 14 months after stopping it. Women should inform their healthcare professional if they wish to become pregnant or think they might be pregnant. Men should not father a child while taking this medicine and for 11 months after stopping it. There is potential for serious side effects to an unborn child. Talk to your healthcare professional for more information. Do not breast-feed an infant while taking this medicine. This medicine has caused ovarian failure in some women. This medicine may make it more difficult to get pregnant. Talk to your healthcare professional if you are concerned about your fertility. This medicine has caused decreased sperm counts in some men.  This may make it more difficult to father a child. Talk to your healthcare professional if you are concerned about your fertility. Drink fluids as directed while you are taking this medicine. This will help protect your kidneys. Call your doctor or health care professional if you get diarrhea. Do not treat yourself. What side effects may I notice from receiving this medication? Side effects that you should report to your doctor or health care professional as soon as  possible: allergic reactions like skin rash, itching or hives, swelling of the face, lips, or tongue blurred vision changes in vision decreased hearing or ringing of the ears nausea, vomiting pain, redness, or irritation at site where injected pain, tingling, numbness in the hands or feet signs and symptoms of bleeding such as bloody or black, tarry stools; red or dark brown urine; spitting up blood or brown material that looks like coffee grounds; red spots on the skin; unusual bruising or bleeding from the eyes, gums, or nose signs and symptoms of infection like fever; chills; cough; sore throat; pain or trouble passing urine signs and symptoms of kidney injury like trouble passing urine or change in the amount of urine signs and symptoms of low red blood cells or anemia such as unusually weak or tired; feeling faint or lightheaded; falls; breathing problems Side effects that usually do not require medical attention (report to your doctor or health care professional if they continue or are bothersome): loss of appetite mouth sores muscle cramps This list may not describe all possible side effects. Call your doctor for medical advice about side effects. You may report side effects to FDA at 1-800-FDA-1088. Where should I keep my medication? This drug is given in a hospital or clinic and will not be stored at home. NOTE: This sheet is a summary. It may not cover all possible information. If you have questions about this medicine, talk to your doctor, pharmacist, or health care provider.  2022 Elsevier/Gold Standard (2020-11-16 00:00:00)

## 2021-04-26 ENCOUNTER — Ambulatory Visit
Admission: RE | Admit: 2021-04-26 | Discharge: 2021-04-26 | Disposition: A | Discharge status: Home / Self Care | Payer: Medicare Other | Source: Ambulatory Visit | Attending: Radiation Oncology | Admitting: Radiation Oncology

## 2021-04-26 DIAGNOSIS — Z510 Encounter for antineoplastic radiation therapy: Secondary | ICD-10-CM | POA: Diagnosis not present

## 2021-04-26 DIAGNOSIS — C12 Malignant neoplasm of pyriform sinus: Secondary | ICD-10-CM | POA: Diagnosis not present

## 2021-04-27 ENCOUNTER — Ambulatory Visit
Admission: RE | Admit: 2021-04-27 | Discharge: 2021-04-27 | Disposition: A | Discharge status: Home / Self Care | Payer: Medicare Other | Source: Ambulatory Visit | Attending: Radiation Oncology | Admitting: Radiation Oncology

## 2021-04-27 DIAGNOSIS — Z510 Encounter for antineoplastic radiation therapy: Secondary | ICD-10-CM | POA: Diagnosis not present

## 2021-04-27 DIAGNOSIS — C12 Malignant neoplasm of pyriform sinus: Secondary | ICD-10-CM | POA: Diagnosis not present

## 2021-04-28 ENCOUNTER — Inpatient Hospital Stay: Payer: Medicare Other

## 2021-04-28 ENCOUNTER — Other Ambulatory Visit: Payer: Self-pay

## 2021-04-28 ENCOUNTER — Ambulatory Visit
Admission: RE | Admit: 2021-04-28 | Discharge: 2021-04-28 | Disposition: A | Discharge status: Home / Self Care | Payer: Medicare Other | Source: Ambulatory Visit | Attending: Radiation Oncology | Admitting: Radiation Oncology

## 2021-04-28 DIAGNOSIS — Z510 Encounter for antineoplastic radiation therapy: Secondary | ICD-10-CM | POA: Diagnosis not present

## 2021-04-28 DIAGNOSIS — C12 Malignant neoplasm of pyriform sinus: Secondary | ICD-10-CM | POA: Diagnosis not present

## 2021-04-28 NOTE — Progress Notes (Signed)
Nutrition Follow-up:  Patient with stage IV pyriform sinus cancer.  Patient receiving concurrent chemotherapy and radiation.   Met with patient after radiation.  Patient reports painful swallowing.  "Even water hurts to swallow."  Says that yesterday was able to eat cup of pudding and pack of peanut butter nabs. Drank less than 16 oz of water all day.  Says that he is using lidocaine, nystatin and it has help some.  Taking tylenol for pain.  Tried oral nutrition supplements but does not like the sweetness of the shakes.  "I can't drink them."    Noted seeing SLP on 2/17    Medications: nystatin, lidocaine, MMW  Labs: reviewed  Anthropometrics:   Weight 182 lb 9.6 oz on 2/13  186 lb 14.4 oz on 2/6 194 lb 3.2 oz on 1/27 190 lb 3/39/22  6% weight loss in the last 3 weeks, significant   NUTRITION DIAGNOSIS: Inadequate oral intake continues    INTERVENTION:  Discussed option of feeding tube placement with patient due to painful swallowing, low intake of calories and protein, and ongoing weight loss.  Patient wants to hold off on tube placement at this time. MD and NP notified.  Reviewed ways to add calories and protein to diet.  Encouraged eating/drinking q 1-2 hours during the day.   Encouraged patient to discuss with MD if tylenol not helping pain.    MONITORING, EVALUATION, GOAL: weight trends, intake   NEXT VISIT: Thursday, Feb 23rd during infusion  Kaye Mitro [name], [place], [place] Registered Dietitian [phone] (mobile)

## 2021-04-29 ENCOUNTER — Ambulatory Visit: Payer: Medicare Other | Attending: Hospice and Palliative Medicine | Admitting: Speech Pathology

## 2021-04-29 ENCOUNTER — Ambulatory Visit
Admission: RE | Admit: 2021-04-29 | Discharge: 2021-04-29 | Disposition: A | Discharge status: Home / Self Care | Payer: Medicare Other | Source: Ambulatory Visit | Attending: Radiation Oncology | Admitting: Radiation Oncology

## 2021-04-29 DIAGNOSIS — R1310 Dysphagia, unspecified: Secondary | ICD-10-CM | POA: Insufficient documentation

## 2021-04-29 DIAGNOSIS — C12 Malignant neoplasm of pyriform sinus: Secondary | ICD-10-CM | POA: Diagnosis not present

## 2021-04-29 DIAGNOSIS — Z510 Encounter for antineoplastic radiation therapy: Secondary | ICD-10-CM | POA: Diagnosis not present

## 2021-05-01 ENCOUNTER — Encounter: Payer: Self-pay | Admitting: Speech Pathology

## 2021-05-01 NOTE — Therapy (Signed)
[place] MAIN [facility] SERVICES [address] [place], [place], [address] Phone: [phone]   Fax:  [phone]  Speech Language Pathology Evaluation CLINICAL SWALLOW EVALUATION  Patient Details  Name: Travis Arellano MRN: [id number] Date of Birth: 01-08-1946 Referring Provider (SLP): Altha Harm   Encounter Date: 04/29/2021   End of Session - 05/01/21 1054     Visit Number 1    Number of Visits 9    Date for SLP Re-Evaluation 06/26/21    Authorization Type [employer]; [employer] secondary    Authorization Time Period 04/29/2021 thru 06/26/2021    Authorization - Visit Number 1    Progress Note Due on Visit 10    SLP Start Time 1100    SLP Stop Time  1200    SLP Time Calculation (min) 60 min    Activity Tolerance Patient tolerated treatment well             Past Medical History:  Diagnosis Date   Cancer of pyriform sinus (HCC)    High cholesterol    Hypertension    Wears dentures    Full upper, partial lower    Past Surgical History:  Procedure Laterality Date   CATARACT EXTRACTION W/PHACO Right 05/17/2020   Procedure: CATARACT EXTRACTION PHACO AND INTRAOCULAR LENS PLACEMENT (Real);  Surgeon: Baruch Goldmann, MD;  Location: [facility];  Service: Ophthalmology;  Laterality: Right;  CDE: 15.03   CATARACT EXTRACTION W/PHACO Left 06/11/2020   Procedure: CATARACT EXTRACTION PHACO AND INTRAOCULAR LENS PLACEMENT (IOC);  Surgeon: Baruch Goldmann, MD;  Location: [facility];  Service: Ophthalmology;  Laterality: Left;  CDE: 13.98   COLONOSCOPY     IR IMAGING GUIDED PORT INSERTION  04/05/2021   MICROLARYNGOSCOPY N/A 03/22/2021   Procedure: MICROSCOPIC DIRECT LARYNGOSCOPY WITH BIOPSY;  Surgeon: Clyde Canterbury, MD;  Location: [facility];  Service: ENT;  Laterality: N/A;   RIGID ESOPHAGOSCOPY N/A 03/22/2021   Procedure: RIGID ESOPHAGOSCOPY;  Surgeon: Clyde Canterbury, MD;  Location: [facility];  Service: ENT;  Laterality: N/A;     There were no vitals filed for this visit.   Subjective Assessment - 05/01/21 1044     Subjective pt pleasant, good historian, grimacing when swallowing own salvia    Currently in Pain? Yes    Pain Score 7     Pain Location Throat    Pain Descriptors / Indicators Aching;Burning;Constant    Pain Type Chronic pain    Pain Onset More than a month ago    Pain Frequency Constant    Aggravating Factors  swallowing    Pain Relieving Factors none    Effect of Pain on Daily Activities decreased oral intake    Multiple Pain Sites No                SLP Evaluation [facility] - 05/01/21 1044       SLP Visit Information   SLP Received On 04/29/21    Referring Provider (SLP) Altha Harm    Onset Date 03/2021    Medical Diagnosis Odynophagia d/t cancer of pyriform sinus      General Information   HPI Pt is a 76 year old male who was diagnosed with Malignant neoplasm of pyriform sinus by pt's ENT (Dr Clyde Canterbury). CT soft tissue neck on 03/18/2021 revealed asymmetric soft tissue thickening and enhancement of the left aryepiglottic fold and adjacent paraglottic fat suspicious for malignancy. Abnormal right level 1/2 and left level 2 likely cystic/necrotic nodes. A subcentimeter low-density lesion  of the right vallecula probably reflects a cyst but direct visual inspection is recommended. Pt began chemo radiation on 04/18/2021.    Behavioral/Cognition appropriate, good historian    Mobility Status ambulatory      Balance Screen   Has the patient fallen in the past 6 months No    Has the patient had a decrease in activity level because of a fear of falling?  No    Is the patient reluctant to leave their home because of a fear of falling?  No      Prior Functional Status   Cognitive/Linguistic Baseline Within functional limits      Motor Speech   Overall Motor Speech Appears within functional limits for tasks assessed      Standardized Assessments   Standardized Assessments  Other  Assessment   Clinical Swallow Evaluation                    SLP Education - 05/01/21 1053     Education Details provided, see impression statement    Person(s) Educated Patient    Methods Explanation;Demonstration;Verbal cues;Handout    Comprehension Verbalized understanding;Need further instruction              SLP Short Term Goals - 05/01/21 1056       SLP SHORT TERM GOAL #1   Title Pt will demonstrate understanding of current medical condition by answering [facility] qeustions with > 90% accuracy.    Baseline new goal    Time 10    Period --   sessions   Status New      SLP SHORT TERM GOAL #2   Title Pt will demonstrate understanding of s/s of aspiration by answering [facility] questions with > 90% accuracy.    Baseline new goal    Time 10    Period --   sessions   Status New              SLP Long Term Goals - 05/01/21 1058       SLP LONG TERM GOAL #1   Title Pt will reduce risk of aspiration by demonstrating understanding of aspiration precautions/diet modification.    Baseline new goal    Time 8    Period Weeks    Status New    Target Date 06/26/21              Plan - 05/01/21 1055     Clinical Impression Statement Pt presents odynophagia related to malignant neoplasm of pyriform sinus and has completed 10 of 35 radiation treatments. He demonstrates adequate oropharyngeal abilities when consuming thin liquids via cup. Location of pyriform sinus reviewed with visual aid as well as information about pyriform sinuses role in the pharyngeal phase of swallowing. While his swallow initiation appears swift, he grimaces in pain and rates pain a 7 out of 10.        Additional information was provided on:   premedicating with Tylenol and nystatin/lidocaine mouthwash before consuming POs   Using a straw to consume Ensure with milk (using straw with decrease pt's ability to taste it - he finds it too sweet)   Consuming smooth items - avoiding bread, spicy foods and  tough meats   Try a pectin lozenge/oral demulcent to ease irritation      Education also provided on s/s of aspiration and pneumonia, pt encouraged to continue being active and mobile.      Plan created for 1 additional session to further education, answer  any questions pt may have.    Speech Therapy Frequency 1x /week    Duration 8 weeks    Treatment/Interventions Aspiration precaution training;Pharyngeal strengthening exercises;SLP instruction and feedback;Patient/family education    Potential to Achieve Goals Good    Consulted and Agree with Plan of Care Patient             Patient will benefit from skilled therapeutic intervention in order to improve the following deficits and impairments:   Odynophagia  Cancer of pyriform sinus [facility])    Problem List Patient Active Problem List   Diagnosis Date Noted   Cancer of pyriform sinus ([place]) 03/30/2021   Joselinne Lawal B. Rutherford Nail M.S., CCC-SLP, [facility] Pathologist Rehabilitation Services Office [phone]  Stormy Fabian, [place] 05/01/2021, 11:03 AM  [place] MAIN [facility] SERVICES [address] [place], [place], [address] Phone: [phone]   Fax:  [phone]  Name: Travis Arellano MRN: [id number] Date of Birth: 1945/04/30

## 2021-05-02 ENCOUNTER — Inpatient Hospital Stay: Payer: Medicare Other

## 2021-05-02 ENCOUNTER — Other Ambulatory Visit: Payer: Self-pay

## 2021-05-02 ENCOUNTER — Inpatient Hospital Stay (HOSPITAL_BASED_OUTPATIENT_CLINIC_OR_DEPARTMENT_OTHER): Payer: Medicare Other | Admitting: Internal Medicine

## 2021-05-02 ENCOUNTER — Encounter: Payer: Self-pay | Admitting: Internal Medicine

## 2021-05-02 ENCOUNTER — Ambulatory Visit
Admission: RE | Admit: 2021-05-02 | Discharge: 2021-05-02 | Disposition: A | Discharge status: Home / Self Care | Payer: Medicare Other | Source: Ambulatory Visit | Attending: Radiation Oncology | Admitting: Radiation Oncology

## 2021-05-02 VITALS — BP 121/69 | HR 71

## 2021-05-02 DIAGNOSIS — C12 Malignant neoplasm of pyriform sinus: Secondary | ICD-10-CM

## 2021-05-02 DIAGNOSIS — Z510 Encounter for antineoplastic radiation therapy: Secondary | ICD-10-CM | POA: Diagnosis not present

## 2021-05-02 LAB — CBC WITH DIFFERENTIAL/PLATELET
Abs Immature Granulocytes: 0.02 10*3/uL (ref 0.00–0.07)
Basophils Absolute: 0 10*3/uL (ref 0.0–0.1)
Basophils Relative: 0 %
Eosinophils Absolute: 0.1 10*3/uL (ref 0.0–0.5)
Eosinophils Relative: 2 %
HCT: 40.1 % (ref 39.0–52.0)
Hemoglobin: 13.7 g/dL (ref 13.0–17.0)
Immature Granulocytes: 0 %
Lymphocytes Relative: 11 %
Lymphs Abs: 0.7 10*3/uL (ref 0.7–4.0)
MCH: 30 pg (ref 26.0–34.0)
MCHC: 34.2 g/dL (ref 30.0–36.0)
MCV: 87.9 fL (ref 80.0–100.0)
Monocytes Absolute: 0.6 10*3/uL (ref 0.1–1.0)
Monocytes Relative: 8 %
Neutro Abs: 5.3 10*3/uL (ref 1.7–7.7)
Neutrophils Relative %: 79 %
Platelets: 153 10*3/uL (ref 150–400)
RBC: 4.56 MIL/uL (ref 4.22–5.81)
RDW: 11.9 % (ref 11.5–15.5)
WBC: 6.7 10*3/uL (ref 4.0–10.5)
nRBC: 0 % (ref 0.0–0.2)

## 2021-05-02 LAB — COMPREHENSIVE METABOLIC PANEL
ALT: 21 U/L (ref 0–44)
AST: 20 U/L (ref 15–41)
Albumin: 3.7 g/dL (ref 3.5–5.0)
Alkaline Phosphatase: 62 U/L (ref 38–126)
Anion gap: 9 (ref 5–15)
BUN: 26 mg/dL — ABNORMAL HIGH (ref 8–23)
CO2: 24 mmol/L (ref 22–32)
Calcium: 9 mg/dL (ref 8.9–10.3)
Chloride: 101 mmol/L (ref 98–111)
Creatinine, Ser: 1.12 mg/dL (ref 0.61–1.24)
GFR, Estimated: >60 mL/min (ref >60)
Glucose, Bld: 139 mg/dL — ABNORMAL HIGH (ref 70–99)
Potassium: 3.5 mmol/L (ref 3.5–5.1)
Sodium: 134 mmol/L — ABNORMAL LOW (ref 135–145)
Total Bilirubin: 0.6 mg/dL (ref 0.3–1.2)
Total Protein: 6.7 g/dL (ref 6.5–8.1)

## 2021-05-02 LAB — HEMOGLOBIN A1C
Hgb A1c MFr Bld: 6.5 % — ABNORMAL HIGH (ref 4.8–5.6)
Mean Plasma Glucose: 140 mg/dL

## 2021-05-02 LAB — MAGNESIUM: Magnesium: 2.3 mg/dL (ref 1.7–2.4)

## 2021-05-02 MED ORDER — PALONOSETRON HCL INJECTION 0.25 MG/5ML
0.2500 mg | Freq: Once | INTRAVENOUS | Status: AC
  Administered 2021-05-02: 0.25 mg via INTRAVENOUS
  Filled 2021-05-02: qty 5

## 2021-05-02 MED ORDER — SODIUM CHLORIDE 0.9 % IV SOLN
40.0000 mg/m2 | Freq: Once | INTRAVENOUS | Status: AC
  Administered 2021-05-02: 82 mg via INTRAVENOUS
  Filled 2021-05-02: qty 82

## 2021-05-02 MED ORDER — HEPARIN SOD (PORK) LOCK FLUSH 100 UNIT/ML IV SOLN
500.0000 [IU] | Freq: Once | INTRAVENOUS | Status: AC | PRN
  Administered 2021-05-02: 500 [IU]
  Filled 2021-05-02: qty 5

## 2021-05-02 MED ORDER — MAGNESIUM SULFATE 2 GM/50ML IV SOLN
2.0000 g | Freq: Once | INTRAVENOUS | Status: AC
  Administered 2021-05-02: 2 g via INTRAVENOUS
  Filled 2021-05-02: qty 50

## 2021-05-02 MED ORDER — SODIUM CHLORIDE 0.9 % IV SOLN
10.0000 mg | Freq: Once | INTRAVENOUS | Status: AC
  Administered 2021-05-02: 10 mg via INTRAVENOUS
  Filled 2021-05-02: qty 10

## 2021-05-02 MED ORDER — SODIUM CHLORIDE 0.9 % IV SOLN
INTRAVENOUS | Status: DC
  Filled 2021-05-02 (×2): qty 250

## 2021-05-02 MED ORDER — SODIUM CHLORIDE 0.9 % IV SOLN
Freq: Once | INTRAVENOUS | Status: AC
  Filled 2021-05-02: qty 250

## 2021-05-02 MED ORDER — SODIUM CHLORIDE 0.9 % IV SOLN
150.0000 mg | Freq: Once | INTRAVENOUS | Status: AC
  Administered 2021-05-02: 150 mg via INTRAVENOUS
  Filled 2021-05-02: qty 150

## 2021-05-02 MED ORDER — POTASSIUM CHLORIDE IN NACL 20-0.9 MEQ/L-% IV SOLN
Freq: Once | INTRAVENOUS | Status: AC
  Filled 2021-05-02: qty 1000

## 2021-05-02 NOTE — Progress Notes (Signed)
All labs and vitals reviewed by MD. Per Dr. Rogue Bussing, ok to proceed with treatment today.  Patient has voided 120 ml of urine since he's been back in infusion, but states that he went this morning as soon as he got here. MD notified. Per Dr. Rogue Bussing, can go ahead with premeds and treatment and will add an additional 500 ml of NS while premeds are running.

## 2021-05-02 NOTE — Patient Instructions (Signed)
#   Recommend holding lisinopril/hydrochlorothiazide; Cardura at this time.    #Recommend checking blood pressures on a daily basis and bring log to the next visit.

## 2021-05-02 NOTE — Patient Instructions (Signed)
[facility]CER CTR AT [place]-[facility]ICAL ONCOLOGY  Discharge Instructions: ?Thank you for choosing [facility] to provide your oncology and hematology care.  ?If you have a lab appointment with the Cancer Center, please go directly to the Cancer Center and check in at the registration area. ? ?Wear comfortable clothing and clothing appropriate for easy access to any Portacath or PICC line.  ? ?We strive to give you quality time with your provider. You may need to reschedule your appointment if you arrive late (15 or more minutes).  Arriving late affects you and other patients whose appointments are after yours.  Also, if you miss three or more appointments without notifying the office, you may be dismissed from the clinic at the provider?s discretion.    ?  ?For prescription refill requests, have your pharmacy contact our office and allow 72 hours for refills to be completed.   ? ?  ?To help prevent nausea and vomiting after your treatment, we encourage you to take your nausea medication as directed. ? ?BELOW ARE SYMPTOMS THAT SHOULD BE REPORTED IMMEDIATELY: ?*FEVER GREATER THAN 100.4 F (38 ?C) OR HIGHER ?*CHILLS OR SWEATING ?*NAUSEA AND VOMITING THAT IS NOT CONTROLLED WITH YOUR NAUSEA MEDICATION ?*UNUSUAL SHORTNESS OF BREATH ?*UNUSUAL BRUISING OR BLEEDING ?*URINARY PROBLEMS (pain or burning when urinating, or frequent urination) ?*BOWEL PROBLEMS (unusual diarrhea, constipation, pain near the anus) ?TENDERNESS IN MOUTH AND THROAT WITH OR WITHOUT PRESENCE OF ULCERS (sore throat, sores in mouth, or a toothache) ?UNUSUAL RASH, SWELLING OR PAIN  ?UNUSUAL VAGINAL DISCHARGE OR ITCHING  ? ?Items with * indicate a potential emergency and should be followed up as soon as possible or go to the Emergency Department if any problems should occur. ? ?Please show the CHEMOTHERAPY ALERT CARD or IMMUNOTHERAPY ALERT CARD at check-in to the Emergency Department and triage nurse. ? ?Should you have questions after your visit  or need to cancel or reschedule your appointment, please contact [facility] AT [place]-[facility]ICAL ONCOLOGY  [phone] and follow the prompts.  Office hours are 8:00 a.m. to 4:30 p.m. Monday - Friday. Please note that voicemails left after 4:00 p.m. may not be returned until the following business day.  We are closed weekends and major holidays. You have access to a nurse at all times for urgent questions. Please call the main number to the clinic [phone] and follow the prompts. ? ?For any non-urgent questions, you may also contact your provider using MyChart. We now offer e-Visits for anyone 18 and older to request care online for non-urgent symptoms. For details visit [email or web address]. ?  ?Also download the MyChart app! Go to the app store, search "MyChart", open the app, select [place], and log in with your MyChart username and password. ? ?Due to Covid, a mask is required upon entering the hospital/clinic. If you do not have a mask, one will be given to you upon arrival. For doctor visits, patients may have 1 support person aged 18 or older with them. For treatment visits, patients cannot have anyone with them due to current Covid guidelines and our immunocompromised population.  ?

## 2021-05-02 NOTE — Progress Notes (Signed)
C/o with wt loss, constipation.

## 2021-05-02 NOTE — Progress Notes (Signed)
Travis Arellano NOTE  Patient Care Team: [facility] as PCP - General Rogue Bussing, Elisha Headland, MD as Consulting Physician (Oncology) Noreene Filbert, MD as Consulting Physician (Radiation Oncology) Clyde Canterbury, MD as Referring Physician (Otolaryngology)  CHIEF COMPLAINTS/PURPOSE OF CONSULTATION: head and neck cancer  #  Oncology History Overview Note  DIAGNOSIS:  A. PIRIFORM SINUS LESION, LEFT; BIOPSY:  - INVASIVE SQUAMOUS CELL CARCINOMA, KERATINIZING.   Asymmetric soft tissue thickening and enhancement of the left aryepiglottic fold and adjacent paraglottic fat suspicious for malignancy. Abnormal right level 1/2 and left level 2 likely cystic/necrotic nodes.   A subcentimeter low-density lesion of the right vallecula probably reflects a cyst but direct visual inspection is recommended.  # JAN 2023-Pyriform sinus- SCC-[Dr.Bennett]clinically T1 [~1cm; N2a]; PET scan Jan 19th, 2023- The mucosal thickening along the left piriform sinus is highlyHypermetabolic'  The contralateral right level IIa lymph node is mildly hypermetabolic along its more solid-appearing anterior inferior border, maximum SUV 4.0. The left level IIa lymph node has activity less than blood pool, although its partially cystic appearance was still unusual and suspicious.   # FEB 6th, 2023- Cisplatin-RT        Cancer of pyriform sinus ([facility])  03/30/2021 Initial Diagnosis   Cancer of pyriform sinus ([place])   04/18/2021 -  Chemotherapy   Patient is on Treatment Plan : HEAD/NECK Cisplatin q7d      Cancer Staging   Cancer Staging  No matching staging information was found for the patient.   04/18/2021 Cancer Staging   Staging form: Pharynx - Hypopharynx, AJCC 8th Edition - Clinical: Stage IVA (cT1, cN2c, cM0) - Signed by Cammie Sickle, MD on 04/18/2021       HISTORY OF PRESENTING ILLNESS:  Travis Arellano 76 y.o.  male newly diagnosed pyriform sinus  cancer/locally advanced currently on concurrent chemoradiation is here for follow-up.   Patient complains of weight loss; complains of constipation.  Also complains of difficulty swallowing painful swallowing.  In the interim has met with nutrition.  Patient had constipation for several days.  Improved with laxatives.  Had bowel movement yesterday.  Continues to have pain with swallowing.  Is not using hydrocodone liquid.  Using Tylenol.  Denies new shortness of breath or cough.  Review of Systems  Constitutional:  Positive for malaise/fatigue and weight loss. Negative for chills, diaphoresis and fever.  HENT:  Negative for nosebleeds and sore throat.   Eyes:  Negative for double vision.  Respiratory:  Negative for cough, hemoptysis, sputum production, shortness of breath and wheezing.   Cardiovascular:  Negative for chest pain, palpitations, orthopnea and leg swelling.  Gastrointestinal:  Negative for abdominal pain, blood in stool, constipation, diarrhea, heartburn, melena, nausea and vomiting.  Genitourinary:  Negative for dysuria, frequency and urgency.  Musculoskeletal:  Positive for back pain and joint pain.  Skin: Negative.  Negative for itching and rash.  Neurological:  Negative for dizziness, tingling, focal weakness, weakness and headaches.  Endo/Heme/Allergies:  Does not bruise/bleed easily.  Psychiatric/Behavioral:  Negative for depression. The patient is not nervous/anxious and does not have insomnia.     MEDICAL HISTORY:  Past Medical History:  Diagnosis Date   Cancer of pyriform sinus ([facility])    High cholesterol    Hypertension    Wears dentures    Full upper, partial lower    SURGICAL HISTORY: Past Surgical History:  Procedure Laterality Date   CATARACT EXTRACTION W/PHACO Right 05/17/2020   Procedure: CATARACT EXTRACTION PHACO AND  INTRAOCULAR LENS PLACEMENT (IOC);  Surgeon: Baruch Goldmann, MD;  Location: [facility];  Service: Ophthalmology;  Laterality: Right;  CDE: 15.03    CATARACT EXTRACTION W/PHACO Left 06/11/2020   Procedure: CATARACT EXTRACTION PHACO AND INTRAOCULAR LENS PLACEMENT (IOC);  Surgeon: Baruch Goldmann, MD;  Location: [facility];  Service: Ophthalmology;  Laterality: Left;  CDE: 13.98   COLONOSCOPY     IR IMAGING GUIDED PORT INSERTION  04/05/2021   MICROLARYNGOSCOPY N/A 03/22/2021   Procedure: MICROSCOPIC DIRECT LARYNGOSCOPY WITH BIOPSY;  Surgeon: Clyde Canterbury, MD;  Location: [facility];  Service: ENT;  Laterality: N/A;   RIGID ESOPHAGOSCOPY N/A 03/22/2021   Procedure: RIGID ESOPHAGOSCOPY;  Surgeon: Clyde Canterbury, MD;  Location: [facility];  Service: ENT;  Laterality: N/A;    SOCIAL HISTORY: Social History   Socioeconomic History   Marital status: Divorced    Spouse name: Not on file   Number of children: Not on file   Years of education: Not on file   Highest education level: Not on file  Occupational History   Not on file  Tobacco Use   Smoking status: Former    Types: Cigarettes    Quit date: 1999    Years since quitting: 24.1   Smokeless tobacco: Never  Vaping Use   Vaping Use: Never used  Substance and Sexual Activity   Alcohol use: No   Drug use: No   Sexual activity: Not on file  Other Topics Concern   Not on file  Social History Narrative   Lives between [place] [[address] mins [address]]; with son and brother. Quit smoking in 1999 [prior all life]; quit alcohol 30 years- used to drink beer. Retd/ Part time- auto-mechanic work.    Social Determinants of Health   Financial Resource Strain: Not on file  Food Insecurity: Not on file  Transportation Needs: Not on file  Physical Activity: Not on file  Stress: Not on file  Social Connections: Not on file  Intimate Partner Violence: Not on file    FAMILY HISTORY: Family History  Problem Relation Age of Onset   Congestive Heart Failure Mother    Diabetes Father    Lung cancer Brother     ALLERGIES:  has No Known Allergies.  MEDICATIONS:  Current  Outpatient Medications  Medication Sig Dispense Refill   doxazosin (CARDURA) 4 MG tablet Take 4 mg by mouth daily.     hydrochlorothiazide (MICROZIDE) 12.5 MG capsule Take 12.5 mg by mouth daily.     lidocaine-prilocaine (EMLA) cream Apply 1 application topically as needed. Apply to port and cover with saran wrap 1-2 hours prior to port access 30 g 1   lisinopril (PRINIVIL,ZESTRIL) 20 MG tablet Take 20 mg by mouth at bedtime.     magic mouthwash (nystatin, lidocaine, diphenhydrAMINE, alum & mag hydroxide) suspension Swish and swallow 5 mLs 4 (four) times daily as needed for mouth pain. 180 mL 1   metoprolol succinate (TOPROL-XL) 100 MG 24 hr tablet Take 100 mg by mouth daily.     ondansetron (ZOFRAN) 8 MG tablet Take 1 tablet (8 mg total) by mouth every 8 (eight) hours as needed for nausea or vomiting (start 3 days; after chemo). 40 tablet 1   pravastatin (PRAVACHOL) 40 MG tablet Take 40 mg by mouth at bedtime.     prochlorperazine (COMPAZINE) 10 MG tablet Take 1 tablet (10 mg total) by mouth every 6 (six) hours as needed for nausea or vomiting. 40 tablet 1   HYDROcodone-acetaminophen (HYCET) 7.5-325 mg/15  ml solution 10-15 cc PO every 4-6 hours as needed for pain (Patient not taking: Reported on 04/18/2021) 300 mL 0   No current facility-administered medications for this visit.   Facility-Administered Medications Ordered in Other Visits  Medication Dose Route Frequency Provider Last Rate Last Admin   0.9 %  sodium chloride infusion   Intravenous Continuous Cammie Sickle, MD 400 mL/hr at 05/02/21 1242 New Bag at 05/02/21 1242   CISplatin (PLATINOL) 82 mg in sodium chloride 0.9 % 250 mL chemo infusion  40 mg/m2 (Treatment Plan Recorded) Intravenous Once Cammie Sickle, MD       dexamethasone (DECADRON) 10 mg in sodium chloride 0.9 % 50 mL IVPB  10 mg Intravenous Once Charlaine Dalton R, MD 204 mL/hr at 05/02/21 1238 10 mg at 05/02/21 1238   fosaprepitant (EMEND) 150 mg in sodium  chloride 0.9 % 145 mL IVPB  150 mg Intravenous Once Cammie Sickle, MD 450 mL/hr at 05/02/21 1236 150 mg at 05/02/21 1236     PHYSICAL EXAMINATION: ECOG PERFORMANCE STATUS: 1 - Symptomatic but completely ambulatory  Vitals:   05/02/21 0904  BP: 93/61  Pulse: 73  Temp: (!) 95.8 F (35.4 C)  SpO2: 98%   Filed Weights   05/02/21 0904  Weight: 178 lb 4.8 oz (80.9 kg)    Physical Exam Vitals and nursing note reviewed.  HENT:     Head: Normocephalic and atraumatic.     Mouth/Throat:     Pharynx: Oropharynx is clear.  Eyes:     Extraocular Movements: Extraocular movements intact.     Pupils: Pupils are equal, round, and reactive to light.  Cardiovascular:     Rate and Rhythm: Normal rate and regular rhythm.  Pulmonary:     Comments: Decreased breath sounds bilaterally.  Abdominal:     Palpations: Abdomen is soft.  Musculoskeletal:        General: Normal range of motion.     Cervical back: Normal range of motion.  Skin:    General: Skin is warm.  Neurological:     General: No focal deficit present.     Mental Status: He is alert and oriented to person, place, and time.  Psychiatric:        Behavior: Behavior normal.        Judgment: Judgment normal.     LABORATORY DATA:  I have reviewed the data as listed Lab Results  Component Value Date   WBC 6.7 05/02/2021   HGB 13.7 05/02/2021   HCT 40.1 05/02/2021   MCV 87.9 05/02/2021   PLT 153 05/02/2021   Recent Labs    04/18/21 0759 04/25/21 0808 05/02/21 0835  NA 137 132* 134*  K 3.6 3.4* 3.5  CL 104 99 101  CO2 _0 GLUCOSE 146* 156* 139*  BUN 17 15 26*  CREATININE 1.02 0.95 1.12  CALCIUM 9.1 8.8* 9.0  GFRNONAA >60 >60 >60  PROT 6.8 6.9 6.7  ALBUMIN 3.6 3.6 3.7  AST _1 ALT _2 ALKPHOS 61 62 62  BILITOT 0.7 0.7 0.6    RADIOGRAPHIC STUDIES: I have personally reviewed the radiological images as listed and agreed with the findings in the report. IR IMAGING GUIDED PORT  INSERTION  Result Date: 04/05/2021 CLINICAL DATA:  Metastatic head and neck cancer, access for chemotherapy EXAM: RIGHT INTERNAL JUGULAR SINGLE LUMEN POWER PORT CATHETER INSERTION Date:  04/05/2021 04/05/2021 11:58 am Radiologist:  M. Daryll Brod, MD Guidance:  Ultrasound  and fluoroscopic MEDICATIONS: 1% lidocaine with epinephrine local ANESTHESIA/SEDATION: Versed 2.0 mg IV; Fentanyl 100 mcg IV; Moderate Sedation Time:  31 minutes The patient was continuously monitored during the procedure by the interventional radiology nurse under my direct supervision. FLUOROSCOPY TIME:  0 minutes, 48 seconds (40.3 mGy) COMPLICATIONS: None immediate. CONTRAST:  None. PROCEDURE: Informed consent was obtained from the patient following explanation of the procedure, risks, benefits and alternatives. The patient understands, agrees and consents for the procedure. All questions were addressed. A time out was performed. Maximal barrier sterile technique utilized including caps, mask, sterile gowns, sterile gloves, large sterile drape, hand hygiene, and 2% chlorhexidine scrub. Under sterile conditions and local anesthesia, right internal jugular micropuncture venous access was performed. Access was performed with ultrasound. Images were obtained for documentation of the patent right internal jugular vein. A guide wire was inserted followed by a transitional dilator. This allowed insertion of a guide wire and catheter into the IVC. Measurements were obtained from the SVC / RA junction back to the right IJ venotomy site. In the right infraclavicular chest, a subcutaneous pocket was created over the second anterior rib. This was done under sterile conditions and local anesthesia. 1% lidocaine with epinephrine was utilized for this. A 2.5 cm incision was made in the skin. Blunt dissection was performed to create a subcutaneous pocket over the right pectoralis major muscle. The pocket was flushed with saline vigorously. There was adequate  hemostasis. The port catheter was assembled and checked for leakage. The port catheter was secured in the pocket with two retention sutures. The tubing was tunneled subcutaneously to the right venotomy site and inserted into the SVC/RA junction through a valved peel-away sheath. Position was confirmed with fluoroscopy. Images were obtained for documentation. The patient tolerated the procedure well. No immediate complications. Incisions were closed in a two layer fashion with 4 - 0 Vicryl suture. Dermabond was applied to the skin. The port catheter was accessed, blood was aspirated followed by saline and heparin flushes. Needle was removed. A dry sterile dressing was applied. IMPRESSION: Ultrasound and fluoroscopically guided right internal jugular single lumen power port catheter insertion. Tip in the SVC/RA junction. Catheter ready for use. Electronically Signed   By: Jerilynn Mages.  Shick M.D.   On: 04/05/2021 12:55    ASSESSMENT & PLAN:   Cancer of pyriform sinus ([facility]CC) # Pyriform sinus- SCC-clinically T1 [~1cm; N2a]; Stage IVA- PET scan Jan 19th, 2023- The mucosal thickening along the left piriform sinus is highlyHypermetabolic'  The contralateral right level IIa lymph node is mildly hypermetabolic along its more solid-appearing anterior inferior border, maximum SUV 4.0. The left level IIa lymph node has activity less than blood pool, although its partially cystic appearance was still unusual and suspicious.   #Proceed with cisplatin weekly along with radiation [2/06-3/24].  Cycle #3 today.  Add a bolus of 100 cc today given the low urine output.  Discussed with chemo nurse.  # Throat pain- sec to malignancy- recommend Tyelnol 2 pills ; q 8 hour prn]; recommend using hycet liquid [has filled]  #Borderline hypotension systolic [id number] to dehydration/ongoing chemotherapy. -recommend holding lisinopril/hydrochlorothiazide; Cardura at this time.  Recommend checking blood pressures on a daily basis and bring log  to the next visit.  #Risk of weight loss / difficulty swallowing-counseled the patient regarding potential weight loss; moderate to severe mucositis from chemoradiation. 16 pounds in 2 weeks; discussed regarding; wants hold off PEG tube placement.  S/p Joli eval on 2/08. Recommend magic mouth wash; see above.   #  Constipation- several days- BM yesterday- on laxative]  # GBG- 139; [non-diabetic]; awaiting HbA1c-; monitor BG closely n chemo  # IV access/Mediport-s/p mediport-  # DISPOSITION: # chemo today. # in 1 week- labs- cbc/cmp;mag; cisplatin; possible KCL 20 meq IV # follow up in 2 weeks- MD: labs- cbc/cmp/mag;- cisplatin weekly; possible IV KCL 20 meq-Dr.B      All questions were answered. The patient knows to call the clinic with any problems, questions or concerns.       Cammie Sickle, MD 05/02/2021 12:49 PM

## 2021-05-02 NOTE — Assessment & Plan Note (Addendum)
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 The left level IIa lymph node has activity less than blood pool, although its partially cystic appearance was still unusual and suspicious.   #Proceed with cisplatin weekly along with radiation [2/06-3/24].  Cycle #3 today.  Add a bolus of 100 cc today given the low urine output.  Discussed with chemo nurse.  # Throat pain- sec to malignancy- recommend Tyelnol 2 pills ; q 8 hour prn]; recommend using hycet liquid [has filled]  #Borderline hypotension systolic [id number] to dehydration/ongoing chemotherapy. -recommend holding lisinopril/hydrochlorothiazide; Cardura at this time.  Recommend checking blood pressures on a daily basis and bring log to the next visit.  #Risk of weight loss / difficulty swallowing-counseled the patient regarding potential weight loss; moderate to severe mucositis from chemoradiation. 16 pounds in 2 weeks; discussed regarding; wants hold off PEG tube placement.  S/p Joli eval on 2/08. Recommend magic mouth wash; see above.   # Constipation- several days- BM yesterday- on laxative]  # GBG- 139; [non-diabetic]; awaiting HbA1c-; monitor BG closely n chemo  # IV access/Mediport-s/p mediport-  # DISPOSITION: # chemo today. # in 1 week- labs- cbc/cmp;mag; cisplatin; possible KCL 20 meq IV # follow up in 2 weeks- MD: labs- cbc/cmp/mag;- cisplatin weekly; possible IV KCL 20 meq-Dr.B

## 2021-05-03 ENCOUNTER — Ambulatory Visit
Admission: RE | Admit: 2021-05-03 | Discharge: 2021-05-03 | Disposition: A | Discharge status: Home / Self Care | Payer: Medicare Other | Source: Ambulatory Visit | Attending: Radiation Oncology | Admitting: Radiation Oncology

## 2021-05-03 DIAGNOSIS — Z510 Encounter for antineoplastic radiation therapy: Secondary | ICD-10-CM | POA: Diagnosis not present

## 2021-05-03 DIAGNOSIS — C12 Malignant neoplasm of pyriform sinus: Secondary | ICD-10-CM | POA: Diagnosis not present

## 2021-05-04 ENCOUNTER — Ambulatory Visit
Admission: RE | Admit: 2021-05-04 | Discharge: 2021-05-04 | Disposition: A | Discharge status: Home / Self Care | Payer: Medicare Other | Source: Ambulatory Visit | Attending: Radiation Oncology | Admitting: Radiation Oncology

## 2021-05-04 DIAGNOSIS — C12 Malignant neoplasm of pyriform sinus: Secondary | ICD-10-CM | POA: Diagnosis not present

## 2021-05-04 DIAGNOSIS — Z510 Encounter for antineoplastic radiation therapy: Secondary | ICD-10-CM | POA: Diagnosis not present

## 2021-05-05 ENCOUNTER — Ambulatory Visit
Admission: RE | Admit: 2021-05-05 | Discharge: 2021-05-05 | Disposition: A | Discharge status: Home / Self Care | Payer: Medicare Other | Source: Ambulatory Visit | Attending: Radiation Oncology | Admitting: Radiation Oncology

## 2021-05-05 ENCOUNTER — Ambulatory Visit: Payer: Medicare Other | Admitting: Speech Pathology

## 2021-05-05 ENCOUNTER — Inpatient Hospital Stay: Payer: Medicare Other

## 2021-05-05 DIAGNOSIS — C12 Malignant neoplasm of pyriform sinus: Secondary | ICD-10-CM | POA: Diagnosis not present

## 2021-05-05 DIAGNOSIS — Z510 Encounter for antineoplastic radiation therapy: Secondary | ICD-10-CM | POA: Diagnosis not present

## 2021-05-05 NOTE — Progress Notes (Signed)
Nutrition  RD planning to see patient after radiation today however patient left cancer center after treatment.  Will plan to follow-up on 3/2 after radiation treatment.  Haliegh Khurana [name], [place], [place] Registered Dietitian [phone] (mobile)

## 2021-05-06 ENCOUNTER — Ambulatory Visit
Admission: RE | Admit: 2021-05-06 | Discharge: 2021-05-06 | Disposition: A | Discharge status: Home / Self Care | Payer: Medicare Other | Source: Ambulatory Visit | Attending: Radiation Oncology | Admitting: Radiation Oncology

## 2021-05-06 DIAGNOSIS — Z510 Encounter for antineoplastic radiation therapy: Secondary | ICD-10-CM | POA: Diagnosis not present

## 2021-05-06 DIAGNOSIS — C12 Malignant neoplasm of pyriform sinus: Secondary | ICD-10-CM | POA: Diagnosis not present

## 2021-05-09 ENCOUNTER — Ambulatory Visit
Admission: RE | Admit: 2021-05-09 | Discharge: 2021-05-09 | Disposition: A | Discharge status: Home / Self Care | Payer: Medicare Other | Source: Ambulatory Visit | Attending: Radiation Oncology | Admitting: Radiation Oncology

## 2021-05-09 ENCOUNTER — Inpatient Hospital Stay (HOSPITAL_BASED_OUTPATIENT_CLINIC_OR_DEPARTMENT_OTHER): Payer: Medicare Other | Admitting: Hospice and Palliative Medicine

## 2021-05-09 ENCOUNTER — Other Ambulatory Visit: Payer: Self-pay

## 2021-05-09 ENCOUNTER — Inpatient Hospital Stay: Payer: Medicare Other

## 2021-05-09 VITALS — BP 118/90 | HR 79 | Temp 96.7°F | Resp 18 | Wt 176.3 lb

## 2021-05-09 DIAGNOSIS — C12 Malignant neoplasm of pyriform sinus: Secondary | ICD-10-CM

## 2021-05-09 DIAGNOSIS — B370 Candidal stomatitis: Secondary | ICD-10-CM | POA: Diagnosis not present

## 2021-05-09 DIAGNOSIS — Z510 Encounter for antineoplastic radiation therapy: Secondary | ICD-10-CM | POA: Diagnosis not present

## 2021-05-09 LAB — CBC WITH DIFFERENTIAL/PLATELET
Abs Immature Granulocytes: 0.01 10*3/uL (ref 0.00–0.07)
Basophils Absolute: 0 10*3/uL (ref 0.0–0.1)
Basophils Relative: 1 %
Eosinophils Absolute: 0.1 10*3/uL (ref 0.0–0.5)
Eosinophils Relative: 2 %
HCT: 38.1 % — ABNORMAL LOW (ref 39.0–52.0)
Hemoglobin: 13.4 g/dL (ref 13.0–17.0)
Immature Granulocytes: 0 %
Lymphocytes Relative: 11 %
Lymphs Abs: 0.5 10*3/uL — ABNORMAL LOW (ref 0.7–4.0)
MCH: 30.1 pg (ref 26.0–34.0)
MCHC: 35.2 g/dL (ref 30.0–36.0)
MCV: 85.6 fL (ref 80.0–100.0)
Monocytes Absolute: 0.4 10*3/uL (ref 0.1–1.0)
Monocytes Relative: 10 %
Neutro Abs: 3.3 10*3/uL (ref 1.7–7.7)
Neutrophils Relative %: 76 %
Platelets: 124 10*3/uL — ABNORMAL LOW (ref 150–400)
RBC: 4.45 MIL/uL (ref 4.22–5.81)
RDW: 11.8 % (ref 11.5–15.5)
WBC: 4.4 10*3/uL (ref 4.0–10.5)
nRBC: 0 % (ref 0.0–0.2)

## 2021-05-09 LAB — COMPREHENSIVE METABOLIC PANEL
ALT: 15 U/L (ref 0–44)
AST: 18 U/L (ref 15–41)
Albumin: 3.5 g/dL (ref 3.5–5.0)
Alkaline Phosphatase: 51 U/L (ref 38–126)
Anion gap: 9 (ref 5–15)
BUN: 24 mg/dL — ABNORMAL HIGH (ref 8–23)
CO2: 24 mmol/L (ref 22–32)
Calcium: 8.8 mg/dL — ABNORMAL LOW (ref 8.9–10.3)
Chloride: 103 mmol/L (ref 98–111)
Creatinine, Ser: 0.98 mg/dL (ref 0.61–1.24)
GFR, Estimated: >60 mL/min (ref >60)
Glucose, Bld: 122 mg/dL — ABNORMAL HIGH (ref 70–99)
Potassium: 3.5 mmol/L (ref 3.5–5.1)
Sodium: 136 mmol/L (ref 135–145)
Total Bilirubin: 0.5 mg/dL (ref 0.3–1.2)
Total Protein: 6.4 g/dL — ABNORMAL LOW (ref 6.5–8.1)

## 2021-05-09 LAB — MAGNESIUM: Magnesium: 1.8 mg/dL (ref 1.7–2.4)

## 2021-05-09 MED ORDER — MAGNESIUM SULFATE 2 GM/50ML IV SOLN
2.0000 g | Freq: Once | INTRAVENOUS | Status: AC
  Administered 2021-05-09: 2 g via INTRAVENOUS
  Filled 2021-05-09: qty 50

## 2021-05-09 MED ORDER — SODIUM CHLORIDE 0.9 % IV SOLN
150.0000 mg | Freq: Once | INTRAVENOUS | Status: AC
  Administered 2021-05-09: 150 mg via INTRAVENOUS
  Filled 2021-05-09: qty 150

## 2021-05-09 MED ORDER — SODIUM CHLORIDE 0.9 % IV SOLN
40.0000 mg/m2 | Freq: Once | INTRAVENOUS | Status: AC
  Administered 2021-05-09: 82 mg via INTRAVENOUS
  Filled 2021-05-09: qty 82

## 2021-05-09 MED ORDER — SODIUM CHLORIDE 0.9% FLUSH
10.0000 mL | INTRAVENOUS | Status: DC | PRN
  Administered 2021-05-09: 10 mL
  Filled 2021-05-09: qty 10

## 2021-05-09 MED ORDER — HEPARIN SOD (PORK) LOCK FLUSH 100 UNIT/ML IV SOLN
INTRAVENOUS | Status: AC
  Administered 2021-05-09: 500 [IU]
  Filled 2021-05-09: qty 5

## 2021-05-09 MED ORDER — FLUCONAZOLE 100 MG PO TABS: 100.0000 mg | ORAL_TABLET | Freq: Every day | ORAL | 0 refills | Status: DC

## 2021-05-09 MED ORDER — SUCRALFATE 1 GM/10ML PO SUSP: 1.0000 g | Freq: Three times a day (TID) | ORAL | 0 refills | Status: DC

## 2021-05-09 MED ORDER — PALONOSETRON HCL INJECTION 0.25 MG/5ML
0.2500 mg | Freq: Once | INTRAVENOUS | Status: AC
  Administered 2021-05-09: 0.25 mg via INTRAVENOUS
  Filled 2021-05-09: qty 5

## 2021-05-09 MED ORDER — SODIUM CHLORIDE 0.9 % IV SOLN
Freq: Once | INTRAVENOUS | Status: AC
  Filled 2021-05-09: qty 250

## 2021-05-09 MED ORDER — OMEPRAZOLE 20 MG PO CPDR: 20.0000 mg | DELAYED_RELEASE_CAPSULE | Freq: Every day | ORAL | 2 refills | Status: DC

## 2021-05-09 MED ORDER — POTASSIUM CHLORIDE IN NACL 20-0.9 MEQ/L-% IV SOLN
Freq: Once | INTRAVENOUS | Status: AC
  Filled 2021-05-09: qty 1000

## 2021-05-09 MED ORDER — HEPARIN SOD (PORK) LOCK FLUSH 100 UNIT/ML IV SOLN
500.0000 [IU] | Freq: Once | INTRAVENOUS | Status: AC | PRN
  Filled 2021-05-09: qty 5

## 2021-05-09 MED ORDER — SODIUM CHLORIDE 0.9 % IV SOLN
10.0000 mg | Freq: Once | INTRAVENOUS | Status: AC
  Administered 2021-05-09: 10 mg via INTRAVENOUS
  Filled 2021-05-09: qty 10

## 2021-05-09 NOTE — Progress Notes (Signed)
Symptom Management [place] at [facility] Telephone:(336) [phone] Fax:(336) [phone]  Patient Care Team: The [place] as PCP - General Rogue Bussing, Elisha Headland, MD as Consulting Physician (Oncology) Noreene Filbert, MD as Consulting Physician (Radiation Oncology) Clyde Canterbury, MD as Referring Physician (Otolaryngology)   Name of the patient: Travis Arellano  [id number]  11-06-1945   Date of visit: 05/09/21  Reason for Consult: Hansford Hirt is a 76 y.o. 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  Patient was an add-on to [facility] today for evaluation of odynophagia.  Patient continues to endorse painful swallowing but unchanged in characteristic.  He denies seeing any white patches on tongue or mouth.  He denies any coughing spells.  He is using Magic mouthwash, which helps some but is short-lived.  Of note, previously saw patient [facility] for same on 04/25/2021.   Denies any neurologic complaints. Denies recent fevers or illnesses. Denies any easy bleeding or bruising. Reports fair appetite and denies weight loss. Denies chest pain. Denies any nausea, vomiting, constipation, or diarrhea. Denies urinary complaints. Patient offers no further specific complaints today.  PAST MEDICAL HISTORY: Past Medical History:  Diagnosis Date   Cancer of pyriform sinus (HCC)    High cholesterol    Hypertension    Wears dentures    Full upper, partial lower    PAST SURGICAL HISTORY:  Past Surgical History:  Procedure Laterality Date   CATARACT EXTRACTION W/PHACO Right 05/17/2020   Procedure: CATARACT EXTRACTION PHACO AND INTRAOCULAR LENS PLACEMENT (IOC);  Surgeon: Baruch Goldmann, MD;  Location: [facility];  Service: Ophthalmology;  Laterality: Right;  CDE: 15.03   CATARACT EXTRACTION W/PHACO Left 06/11/2020   Procedure: CATARACT EXTRACTION PHACO AND INTRAOCULAR LENS PLACEMENT  (IOC);  Surgeon: Baruch Goldmann, MD;  Location: [facility];  Service: Ophthalmology;  Laterality: Left;  CDE: 13.98   COLONOSCOPY     IR IMAGING GUIDED PORT INSERTION  04/05/2021   MICROLARYNGOSCOPY N/A 03/22/2021   Procedure: MICROSCOPIC DIRECT LARYNGOSCOPY WITH BIOPSY;  Surgeon: Clyde Canterbury, MD;  Location: [place];  Service: ENT;  Laterality: N/A;   RIGID ESOPHAGOSCOPY N/A 03/22/2021   Procedure: RIGID ESOPHAGOSCOPY;  Surgeon: Clyde Canterbury, MD;  Location: [place];  Service: ENT;  Laterality: N/A;    HEMATOLOGY/ONCOLOGY HISTORY:  Oncology History Overview Note  DIAGNOSIS:  A. PIRIFORM SINUS LESION, LEFT; BIOPSY:  - INVASIVE SQUAMOUS CELL CARCINOMA, KERATINIZING.   Asymmetric soft tissue thickening and enhancement of the left aryepiglottic fold and adjacent paraglottic fat suspicious for malignancy. Abnormal right level 1/2 and left level 2 likely cystic/necrotic nodes.   A subcentimeter low-density lesion of the right vallecula probably reflects a cyst but direct visual inspection is recommended.  # JAN 2023-Pyriform sinus- SCC-[Dr.Bennett]clinically T1 [~1cm; N2a]; PET scan Jan 19th, 2023- The mucosal thickening along the left piriform sinus is highlyHypermetabolic'  The contralateral right level IIa lymph node is mildly hypermetabolic along its more solid-appearing anterior inferior border, maximum SUV 4.0. The left level IIa lymph node has activity less than blood pool, although its partially cystic appearance was still unusual and suspicious.   # FEB 6th, 2023- Cisplatin-RT        Cancer of pyriform sinus ([place])  03/30/2021 Initial Diagnosis   Cancer of pyriform sinus ([place])   04/18/2021 -  Chemotherapy   Patient is on Treatment Plan : HEAD/NECK Cisplatin q7d      Cancer Staging  Cancer Staging  No matching staging information was found for the patient.   04/18/2021 Cancer Staging   Staging form: Pharynx - Hypopharynx, AJCC 8th Edition - Clinical: Stage  IVA (cT1, cN2c, cM0) - Signed by Cammie Sickle, MD on 04/18/2021     ALLERGIES:  has No Known Allergies.  MEDICATIONS:  Current Outpatient Medications  Medication Sig Dispense Refill   doxazosin (CARDURA) 4 MG tablet Take 4 mg by mouth daily.     hydrochlorothiazide (MICROZIDE) 12.5 MG capsule Take 12.5 mg by mouth daily.     HYDROcodone-acetaminophen (HYCET) 7.5-325 mg/15 ml solution 10-15 cc PO every 4-6 hours as needed for pain (Patient not taking: Reported on 04/18/2021) 300 mL 0   lidocaine-prilocaine (EMLA) cream Apply 1 application topically as needed. Apply to port and cover with saran wrap 1-2 hours prior to port access 30 g 1   lisinopril (PRINIVIL,ZESTRIL) 20 MG tablet Take 20 mg by mouth at bedtime.     magic mouthwash (nystatin, lidocaine, diphenhydrAMINE, alum & mag hydroxide) suspension Swish and swallow 5 mLs 4 (four) times daily as needed for mouth pain. 180 mL 1   metoprolol succinate (TOPROL-XL) 100 MG 24 hr tablet Take 100 mg by mouth daily.     ondansetron (ZOFRAN) 8 MG tablet Take 1 tablet (8 mg total) by mouth every 8 (eight) hours as needed for nausea or vomiting (start 3 days; after chemo). 40 tablet 1   pravastatin (PRAVACHOL) 40 MG tablet Take 40 mg by mouth at bedtime.     prochlorperazine (COMPAZINE) 10 MG tablet Take 1 tablet (10 mg total) by mouth every 6 (six) hours as needed for nausea or vomiting. 40 tablet 1   No current facility-administered medications for this visit.   Facility-Administered Medications Ordered in Other Visits  Medication Dose Route Frequency Provider Last Rate Last Admin   CISplatin (PLATINOL) 82 mg in sodium chloride 0.9 % 250 mL chemo infusion  40 mg/m2 (Treatment Plan Recorded) Intravenous Once Cammie Sickle, MD       dexamethasone (DECADRON) 10 mg in sodium chloride 0.9 % 50 mL IVPB  10 mg Intravenous Once Charlaine Dalton R, MD       fosaprepitant (EMEND) 150 mg in sodium chloride 0.9 % 145 mL IVPB  150 mg  Intravenous Once Charlaine Dalton R, MD       heparin lock flush 100 unit/mL  500 Units Intracatheter Once PRN Cammie Sickle, MD       magnesium sulfate IVPB 2 g 50 mL  2 g Intravenous Once Cammie Sickle, MD 50 mL/hr at 05/09/21 0955 2 g at 05/09/21 0955   palonosetron (ALOXI) injection 0.25 mg  0.25 mg Intravenous Once Charlaine Dalton R, MD       sodium chloride flush (NS) 0.9 % injection 10 mL  10 mL Intracatheter PRN Cammie Sickle, MD   10 mL at 05/09/21 4097    VITAL SIGNS: There were no vitals taken for this visit. There were no vitals filed for this visit.  Estimated body mass index is 25.29 kg/m as calculated from the following:   Height as of 05/02/21: 5\' 10"  (1.778 m).   Weight as of an earlier encounter on 05/09/21: 176 lb 4.1 oz (79.9 kg).  LABS: CBC:    Component Value Date/Time   WBC 4.4 05/09/2021 0829   HGB 13.4 05/09/2021 0829   HCT 38.1 (L) 05/09/2021 0829   PLT 124 (L) 05/09/2021 0829   MCV 85.6 05/09/2021 0829  NEUTROABS 3.3 05/09/2021 0829   LYMPHSABS 0.5 (L) 05/09/2021 0829   MONOABS 0.4 05/09/2021 0829   EOSABS 0.1 05/09/2021 0829   BASOSABS 0.0 05/09/2021 0829   Comprehensive Metabolic Panel:    Component Value Date/Time   NA 136 05/09/2021 0829   K 3.5 05/09/2021 0829   CL 103 05/09/2021 0829   CO2 24 05/09/2021 0829   BUN 24 (H) 05/09/2021 0829   CREATININE 0.98 05/09/2021 0829   GLUCOSE 122 (H) 05/09/2021 0829   CALCIUM 8.8 (L) 05/09/2021 0829   AST 18 05/09/2021 0829   ALT 15 05/09/2021 0829   ALKPHOS 51 05/09/2021 0829   BILITOT 0.5 05/09/2021 0829   PROT 6.4 (L) 05/09/2021 0829   ALBUMIN 3.5 05/09/2021 0829    RADIOGRAPHIC STUDIES: No results found.  PERFORMANCE STATUS (ECOG) : 1 - Symptomatic but completely ambulatory  Review of Systems Unless otherwise noted, a complete review of systems is negative.  Physical Exam General: NAD HEENT: Thrush on tongue Cardiovascular: regular rate and  rhythm Pulmonary: clear ant fields Abdomen: soft, nontender, + bowel sounds GU: no suprapubic tenderness Extremities: no edema, no joint deformities Skin: no rashes Neurological: Weakness but otherwise nonfocal  Assessment and Plan- Patient is a 76 y.o. 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  This does not seem to have significantly improved his symptoms, although it does not sound like patient has consistently taking this.  We will start him on Diflucan x14 days.  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  Patient RTC to see Dr. Rogue Bussing on 3/6 or we can see him sooner if needed   Patient expressed understanding and was in agreement with this plan. He also understands that He can call clinic at any time with any questions, concerns, or complaints.   Thank you for allowing me to participate in the care of this very pleasant patient.   Time Total: 15 minutes  Visit consisted of counseling and education dealing with the complex and emotionally intense issues of symptom management in the setting of serious illness.Greater than 50%  of this time was spent counseling and coordinating care related to the above assessment and plan.  Signed by: Altha Harm, PhD, NP-C

## 2021-05-09 NOTE — Progress Notes (Signed)
0915- Patient reports he has increased weakness and sore throat since Friday, 05/06/2021. Patient reports burning pain to his right neck where he is receiving radiation treatment. Erythema present to right neck. Patient also reports it is also hard for him to eat or drink anything. Vital signs stable, see flow sheet. MD, Dr. Rogue Bussing, notified.  0928- Per MD, Dr. Rogue Bussing, order: okay to proceed with scheduled Cisplatin treatment today; MD is adding patient on to be seen by symptom management clinic today. Josh Borders, NP, will be coming to evaluate patient at infusion chair side.  [place], NP, is at infusion chair side to evaluate patient.

## 2021-05-09 NOTE — Patient Instructions (Signed)
[facility] AT [place]   Discharge Instructions: Thank you for choosing [facility] to provide your oncology and hematology care.  If you have a lab appointment with the [facility], please go directly to the [facility] and check in at the registration area.   Wear comfortable clothing and clothing appropriate for easy access to any Portacath or PICC line.   We strive to give you quality time with your provider. You may need to reschedule your appointment if you arrive late (15 or more minutes).  Arriving late affects you and other patients whose appointments are after yours.  Also, if you miss three or more appointments without notifying the office, you may be dismissed from the clinic at the providers discretion.      For prescription refill requests, have your pharmacy contact our office and allow 72 hours for refills to be completed.    Today you received the following chemotherapy and/or immunotherapy agents: Cisplatin.      To help prevent nausea and vomiting after your treatment, we encourage you to take your nausea medication as directed.  BELOW ARE SYMPTOMS THAT SHOULD BE REPORTED IMMEDIATELY: *FEVER GREATER THAN 100.4 F (38 C) OR HIGHER *CHILLS OR SWEATING *NAUSEA AND VOMITING THAT IS NOT CONTROLLED WITH YOUR NAUSEA MEDICATION *UNUSUAL SHORTNESS OF BREATH *UNUSUAL BRUISING OR BLEEDING *URINARY PROBLEMS (pain or burning when urinating, or frequent urination) *BOWEL PROBLEMS (unusual diarrhea, constipation, pain near the anus) TENDERNESS IN MOUTH AND THROAT WITH OR WITHOUT PRESENCE OF ULCERS (sore throat, sores in mouth, or a toothache) UNUSUAL RASH, SWELLING OR PAIN  UNUSUAL VAGINAL DISCHARGE OR ITCHING   Items with * indicate a potential emergency and should be followed up as soon as possible or go to the Emergency Department if any problems should occur.  Please show the CHEMOTHERAPY ALERT CARD or IMMUNOTHERAPY ALERT CARD at check-in  to the Emergency Department and triage nurse.  Should you have questions after your visit or need to cancel or reschedule your appointment, please contact [facility] [place]  Dept: [phone]  and follow the prompts.  Office hours are 8:00 a.m. to 4:30 p.m. Monday - Friday. Please note that voicemails left after 4:00 p.m. may not be returned until the following business day.  We are closed weekends and major holidays. You have access to a nurse at all times for urgent questions. Please call the main number to the clinic Dept: [phone] and follow the prompts.  For any non-urgent questions, you may also contact your provider using MyChart. We now offer e-Visits for anyone 68 and older to request care online for non-urgent symptoms. For details visit [email or web address].   Also download the MyChart app! Go to the app store, search "MyChart", open the app, select [place], and log in with your MyChart username and password.  Due to Covid, a mask is required upon entering the hospital/clinic. If you do not have a mask, one will be given to you upon arrival. For doctor visits, patients may have 1 support person aged 15 or older with them. For treatment visits, patients cannot have anyone with them due to current Covid guidelines and our immunocompromised population.

## 2021-05-10 ENCOUNTER — Ambulatory Visit
Admission: RE | Admit: 2021-05-10 | Discharge: 2021-05-10 | Disposition: A | Discharge status: Home / Self Care | Payer: Medicare Other | Source: Ambulatory Visit | Attending: Radiation Oncology | Admitting: Radiation Oncology

## 2021-05-10 DIAGNOSIS — Z510 Encounter for antineoplastic radiation therapy: Secondary | ICD-10-CM | POA: Diagnosis not present

## 2021-05-10 DIAGNOSIS — C12 Malignant neoplasm of pyriform sinus: Secondary | ICD-10-CM | POA: Diagnosis not present

## 2021-05-11 ENCOUNTER — Ambulatory Visit
Admission: RE | Admit: 2021-05-11 | Discharge: 2021-05-11 | Disposition: A | Discharge status: Home / Self Care | Payer: Medicare Other | Source: Ambulatory Visit | Attending: Radiation Oncology | Admitting: Radiation Oncology

## 2021-05-11 DIAGNOSIS — B379 Candidiasis, unspecified: Secondary | ICD-10-CM | POA: Insufficient documentation

## 2021-05-11 DIAGNOSIS — Y842 Radiological procedure and radiotherapy as the cause of abnormal reaction of the patient, or of later complication, without mention of misadventure at the time of the procedure: Secondary | ICD-10-CM | POA: Insufficient documentation

## 2021-05-11 DIAGNOSIS — K208 Other esophagitis without bleeding: Secondary | ICD-10-CM | POA: Diagnosis not present

## 2021-05-11 DIAGNOSIS — D709 Neutropenia, unspecified: Secondary | ICD-10-CM | POA: Diagnosis not present

## 2021-05-11 DIAGNOSIS — C12 Malignant neoplasm of pyriform sinus: Secondary | ICD-10-CM | POA: Insufficient documentation

## 2021-05-11 DIAGNOSIS — R634 Abnormal weight loss: Secondary | ICD-10-CM | POA: Diagnosis not present

## 2021-05-11 DIAGNOSIS — I10 Essential (primary) hypertension: Secondary | ICD-10-CM | POA: Insufficient documentation

## 2021-05-11 DIAGNOSIS — E878 Other disorders of electrolyte and fluid balance, not elsewhere classified: Secondary | ICD-10-CM | POA: Diagnosis not present

## 2021-05-11 DIAGNOSIS — N289 Disorder of kidney and ureter, unspecified: Secondary | ICD-10-CM | POA: Insufficient documentation

## 2021-05-11 DIAGNOSIS — Z801 Family history of malignant neoplasm of trachea, bronchus and lung: Secondary | ICD-10-CM | POA: Diagnosis not present

## 2021-05-11 DIAGNOSIS — Z87891 Personal history of nicotine dependence: Secondary | ICD-10-CM | POA: Insufficient documentation

## 2021-05-11 DIAGNOSIS — Z5111 Encounter for antineoplastic chemotherapy: Secondary | ICD-10-CM | POA: Diagnosis present

## 2021-05-11 DIAGNOSIS — D649 Anemia, unspecified: Secondary | ICD-10-CM | POA: Diagnosis not present

## 2021-05-11 DIAGNOSIS — R739 Hyperglycemia, unspecified: Secondary | ICD-10-CM | POA: Insufficient documentation

## 2021-05-11 DIAGNOSIS — E876 Hypokalemia: Secondary | ICD-10-CM | POA: Insufficient documentation

## 2021-05-11 DIAGNOSIS — D696 Thrombocytopenia, unspecified: Secondary | ICD-10-CM | POA: Insufficient documentation

## 2021-05-11 DIAGNOSIS — E860 Dehydration: Secondary | ICD-10-CM | POA: Insufficient documentation

## 2021-05-11 DIAGNOSIS — R059 Cough, unspecified: Secondary | ICD-10-CM | POA: Diagnosis not present

## 2021-05-11 DIAGNOSIS — E871 Hypo-osmolality and hyponatremia: Secondary | ICD-10-CM | POA: Diagnosis not present

## 2021-05-12 ENCOUNTER — Inpatient Hospital Stay (HOSPITAL_BASED_OUTPATIENT_CLINIC_OR_DEPARTMENT_OTHER): Payer: Medicare Other | Admitting: Hospice and Palliative Medicine

## 2021-05-12 ENCOUNTER — Ambulatory Visit
Admission: RE | Admit: 2021-05-12 | Discharge: 2021-05-12 | Disposition: A | Discharge status: Home / Self Care | Payer: Medicare Other | Source: Ambulatory Visit | Attending: Radiation Oncology | Admitting: Radiation Oncology

## 2021-05-12 ENCOUNTER — Other Ambulatory Visit: Payer: Self-pay

## 2021-05-12 ENCOUNTER — Inpatient Hospital Stay: Payer: Medicare Other

## 2021-05-12 ENCOUNTER — Inpatient Hospital Stay: Payer: Medicare Other | Attending: Internal Medicine

## 2021-05-12 VITALS — BP 129/89 | HR 72 | Temp 97.9°F | Resp 16

## 2021-05-12 DIAGNOSIS — E871 Hypo-osmolality and hyponatremia: Secondary | ICD-10-CM | POA: Insufficient documentation

## 2021-05-12 DIAGNOSIS — E876 Hypokalemia: Secondary | ICD-10-CM | POA: Insufficient documentation

## 2021-05-12 DIAGNOSIS — I10 Essential (primary) hypertension: Secondary | ICD-10-CM | POA: Insufficient documentation

## 2021-05-12 DIAGNOSIS — Z5111 Encounter for antineoplastic chemotherapy: Secondary | ICD-10-CM | POA: Diagnosis not present

## 2021-05-12 DIAGNOSIS — C12 Malignant neoplasm of pyriform sinus: Secondary | ICD-10-CM | POA: Diagnosis not present

## 2021-05-12 DIAGNOSIS — E860 Dehydration: Secondary | ICD-10-CM | POA: Insufficient documentation

## 2021-05-12 DIAGNOSIS — D696 Thrombocytopenia, unspecified: Secondary | ICD-10-CM | POA: Insufficient documentation

## 2021-05-12 DIAGNOSIS — E878 Other disorders of electrolyte and fluid balance, not elsewhere classified: Secondary | ICD-10-CM | POA: Insufficient documentation

## 2021-05-12 DIAGNOSIS — Z87891 Personal history of nicotine dependence: Secondary | ICD-10-CM | POA: Insufficient documentation

## 2021-05-12 DIAGNOSIS — Z801 Family history of malignant neoplasm of trachea, bronchus and lung: Secondary | ICD-10-CM | POA: Insufficient documentation

## 2021-05-12 DIAGNOSIS — R634 Abnormal weight loss: Secondary | ICD-10-CM | POA: Insufficient documentation

## 2021-05-12 DIAGNOSIS — R059 Cough, unspecified: Secondary | ICD-10-CM | POA: Insufficient documentation

## 2021-05-12 DIAGNOSIS — R739 Hyperglycemia, unspecified: Secondary | ICD-10-CM | POA: Insufficient documentation

## 2021-05-12 DIAGNOSIS — N289 Disorder of kidney and ureter, unspecified: Secondary | ICD-10-CM | POA: Insufficient documentation

## 2021-05-12 DIAGNOSIS — D709 Neutropenia, unspecified: Secondary | ICD-10-CM | POA: Insufficient documentation

## 2021-05-12 DIAGNOSIS — Z95828 Presence of other vascular implants and grafts: Secondary | ICD-10-CM

## 2021-05-12 DIAGNOSIS — Y842 Radiological procedure and radiotherapy as the cause of abnormal reaction of the patient, or of later complication, without mention of misadventure at the time of the procedure: Secondary | ICD-10-CM | POA: Insufficient documentation

## 2021-05-12 DIAGNOSIS — D649 Anemia, unspecified: Secondary | ICD-10-CM | POA: Insufficient documentation

## 2021-05-12 DIAGNOSIS — B379 Candidiasis, unspecified: Secondary | ICD-10-CM | POA: Insufficient documentation

## 2021-05-12 DIAGNOSIS — K208 Other esophagitis without bleeding: Secondary | ICD-10-CM | POA: Insufficient documentation

## 2021-05-12 LAB — CBC WITH DIFFERENTIAL/PLATELET
Abs Immature Granulocytes: 0.02 10*3/uL (ref 0.00–0.07)
Basophils Absolute: 0 10*3/uL (ref 0.0–0.1)
Basophils Relative: 0 %
Eosinophils Absolute: 0.1 10*3/uL (ref 0.0–0.5)
Eosinophils Relative: 2 %
HCT: 35.5 % — ABNORMAL LOW (ref 39.0–52.0)
Hemoglobin: 12.6 g/dL — ABNORMAL LOW (ref 13.0–17.0)
Immature Granulocytes: 1 %
Lymphocytes Relative: 13 %
Lymphs Abs: 0.5 10*3/uL — ABNORMAL LOW (ref 0.7–4.0)
MCH: 30.3 pg (ref 26.0–34.0)
MCHC: 35.5 g/dL (ref 30.0–36.0)
MCV: 85.3 fL (ref 80.0–100.0)
Monocytes Absolute: 0.5 10*3/uL (ref 0.1–1.0)
Monocytes Relative: 12 %
Neutro Abs: 2.7 10*3/uL (ref 1.7–7.7)
Neutrophils Relative %: 72 %
Platelets: 80 10*3/uL — ABNORMAL LOW (ref 150–400)
RBC: 4.16 MIL/uL — ABNORMAL LOW (ref 4.22–5.81)
RDW: 11.9 % (ref 11.5–15.5)
WBC: 3.7 10*3/uL — ABNORMAL LOW (ref 4.0–10.5)
nRBC: 0 % (ref 0.0–0.2)

## 2021-05-12 LAB — COMPREHENSIVE METABOLIC PANEL
ALT: 14 U/L (ref 0–44)
AST: 16 U/L (ref 15–41)
Albumin: 3.5 g/dL (ref 3.5–5.0)
Alkaline Phosphatase: 52 U/L (ref 38–126)
Anion gap: 6 (ref 5–15)
BUN: 20 mg/dL (ref 8–23)
CO2: 27 mmol/L (ref 22–32)
Calcium: 8.5 mg/dL — ABNORMAL LOW (ref 8.9–10.3)
Chloride: 100 mmol/L (ref 98–111)
Creatinine, Ser: 1.01 mg/dL (ref 0.61–1.24)
GFR, Estimated: >60 mL/min (ref >60)
Glucose, Bld: 115 mg/dL — ABNORMAL HIGH (ref 70–99)
Potassium: 3.6 mmol/L (ref 3.5–5.1)
Sodium: 133 mmol/L — ABNORMAL LOW (ref 135–145)
Total Bilirubin: 0.6 mg/dL (ref 0.3–1.2)
Total Protein: 6.3 g/dL — ABNORMAL LOW (ref 6.5–8.1)

## 2021-05-12 MED ORDER — SODIUM CHLORIDE 0.9% FLUSH
10.0000 mL | Freq: Once | INTRAVENOUS | Status: AC
  Administered 2021-05-12: 10 mL via INTRAVENOUS
  Filled 2021-05-12: qty 10

## 2021-05-12 MED ORDER — SODIUM CHLORIDE 0.9 % IV SOLN
INTRAVENOUS | Status: AC
  Filled 2021-05-12: qty 250

## 2021-05-12 MED ORDER — HEPARIN SOD (PORK) LOCK FLUSH 100 UNIT/ML IV SOLN
500.0000 [IU] | Freq: Once | INTRAVENOUS | Status: AC
  Administered 2021-05-12: 500 [IU] via INTRAVENOUS
  Filled 2021-05-12: qty 5

## 2021-05-12 NOTE — Progress Notes (Signed)
Nutrition Follow-up: ? ?Patient with stage IV pyriform sinus cancer.  Patient receiving concurrent chemotherapy and radiation.   ? ?Met with patient after radiation.  Patient reports decreased appetite, no taste and painful swallowing.  Says that he has not eaten or drank anything this morning.  Yesterday ate a few bites or macaroni salad and bowl of cheerios.  When asked how much he drank yesterday he said "not much."  Can't remember what he ate on Tuesday.  Does not like nutrition supplements.  Says that sometimes just smelling foods makes him sick to his stomach. Not taking nausea medication. Says that he is taking diflucan, ppi and carafate ? ? ? ?Medications: reviewed ? ?Labs: reviewed ? ?Anthropometrics:  ? ?Weight 176 lb 4.1 oz ?182 lb 9.6 oz on 2/13 ?186 lb 14.4 oz on 2/6 ?194 lb 3.2 oz on 1/27 ?190 lb 05/2020 ? ?9% weight loss in the last 2 months, significant ? ?NUTRITION DIAGNOSIS: Inadequate oral intake continues ? ? ?MALNUTRITION DIAGNOSIS: Patient meets criteria for severe malnutrition related to 9% weight loss in 2 months and eating less than 50% of estimated energy needs in greater than 5 days.  ? ? ?INTERVENTION:  ?Discussed feeding tube again as option to provide nutrition and hydration but patient wants to hold off.   ?Recommend small frequent snacks/liquids q 1-2 hours ?Encouraged patient proactive with taking nausea medication ?Contact MD and [facility] for possible evaluation today.  Patient is going to be seen in [facility]   ?Provided patient with samples of glutamine powder (glutasolve).  Reviewed instructions of 1 packet mixed in water BID.   ?  ? ?MONITORING, EVALUATION, GOAL: weight trends, intake ? ? ?NEXT VISIT: Thursday, March 9 after treatment ? ?Celie Desrochers [name], RD, LDN ?Registered Dietitian ?[phone] (mobile) ? ? ?

## 2021-05-12 NOTE — Progress Notes (Signed)
Symptom Management [place] at [facility] Telephone:(336) [phone] Fax:(336) [phone]  Patient Care Team: [facility] [place] as PCP - General Rogue Bussing, Elisha Headland, MD as Consulting Physician (Oncology) Noreene Filbert, MD as Consulting Physician (Radiation Oncology) Clyde Canterbury, MD as Referring Physician (Otolaryngology)   Name of the patient: Travis Arellano  [id number]  04-29-1945   Date of visit: 05/12/21  Reason for Consult: Brailyn Delman is a 76 y.o. 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    Patient has been seen recently in [facility] for thrush/odynophagia and was started on Diflucan/Carafate/omeprazole.  Patient was seen today by nutritionist with persistently poor oral intake.  He was referred back to [facility] for evaluation.  Patient reports that his painful swallowing is improving.  However, he does not have much of an appetite and is not drinking adequate fluids.  He says that things just do not have a good taste.  He denies fever or chills.  No respiratory, GI, or GU symptoms.  Denies any neurologic complaints. Denies recent fevers or illnesses. Denies any easy bleeding or bruising. Reports fair appetite and denies weight loss. Denies chest pain. Denies any nausea, vomiting, constipation, or diarrhea. Denies urinary complaints. Patient offers no further specific complaints today.  PAST MEDICAL HISTORY: Past Medical History:  Diagnosis Date   Cancer of pyriform sinus (HCC)    High cholesterol    Hypertension    Wears dentures    Full upper, partial lower    PAST SURGICAL HISTORY:  Past Surgical History:  Procedure Laterality Date   CATARACT EXTRACTION W/PHACO Right 05/17/2020   Procedure: CATARACT EXTRACTION PHACO AND INTRAOCULAR LENS PLACEMENT (IOC);  Surgeon: Baruch Goldmann, MD;  [facility];  Service: Ophthalmology;  Laterality:  Right;  CDE: 15.03   CATARACT EXTRACTION W/PHACO Left 06/11/2020   Procedure: CATARACT EXTRACTION PHACO AND INTRAOCULAR LENS PLACEMENT (IOC);  Surgeon: Baruch Goldmann, MD;  Location: [facility];  Service: Ophthalmology;  Laterality: Left;  CDE: 13.98   COLONOSCOPY     IR IMAGING GUIDED PORT INSERTION  04/05/2021   MICROLARYNGOSCOPY N/A 03/22/2021   Procedure: MICROSCOPIC DIRECT LARYNGOSCOPY WITH BIOPSY;  Surgeon: Clyde Canterbury, MD;  Location: [place];  Service: ENT;  Laterality: N/A;   RIGID ESOPHAGOSCOPY N/A 03/22/2021   Procedure: RIGID ESOPHAGOSCOPY;  Surgeon: Clyde Canterbury, MD;  Location: [place];  Service: ENT;  Laterality: N/A;    HEMATOLOGY/ONCOLOGY HISTORY:  Oncology History Overview Note  DIAGNOSIS:  A. PIRIFORM SINUS LESION, LEFT; BIOPSY:  - INVASIVE SQUAMOUS CELL CARCINOMA, KERATINIZING.   Asymmetric soft tissue thickening and enhancement of the left aryepiglottic fold and adjacent paraglottic fat suspicious for malignancy. Abnormal right level 1/2 and left level 2 likely cystic/necrotic nodes.   A subcentimeter low-density lesion of the right vallecula probably reflects a cyst but direct visual inspection is recommended.  # JAN 2023-Pyriform sinus- SCC-[Dr.Bennett]clinically T1 [~1cm; N2a]; PET scan Jan 19th, 2023- The mucosal thickening along the left piriform sinus is highlyHypermetabolic'  The contralateral right level IIa lymph node is mildly hypermetabolic along its more solid-appearing anterior inferior border, maximum SUV 4.0. The left level IIa lymph node has activity less than blood pool, although its partially cystic appearance was still unusual and suspicious.   # FEB 6th, 2023- Cisplatin-RT        Cancer of pyriform sinus ([place])  03/30/2021 Initial Diagnosis   Cancer of pyriform sinus (HCC)  04/18/2021 -  Chemotherapy   Patient is on Treatment Plan : HEAD/NECK Cisplatin q7d      Cancer Staging   Cancer Staging  No matching staging  information was found for the patient.   04/18/2021 Cancer Staging   Staging form: Pharynx - Hypopharynx, AJCC 8th Edition - Clinical: Stage IVA (cT1, cN2c, cM0) - Signed by Cammie Sickle, MD on 04/18/2021     ALLERGIES:  has No Known Allergies.  MEDICATIONS:  Current Outpatient Medications  Medication Sig Dispense Refill   doxazosin (CARDURA) 4 MG tablet Take 4 mg by mouth daily.     fluconazole (DIFLUCAN) 100 MG tablet Take 1 tablet (100 mg total) by mouth daily. 14 tablet 0   hydrochlorothiazide (MICROZIDE) 12.5 MG capsule Take 12.5 mg by mouth daily.     HYDROcodone-acetaminophen (HYCET) 7.5-325 mg/15 ml solution 10-15 cc PO every 4-6 hours as needed for pain (Patient not taking: Reported on 04/18/2021) 300 mL 0   lidocaine-prilocaine (EMLA) cream Apply 1 application topically as needed. Apply to port and cover with saran wrap 1-2 hours prior to port access 30 g 1   lisinopril (PRINIVIL,ZESTRIL) 20 MG tablet Take 20 mg by mouth at bedtime.     magic mouthwash (nystatin, lidocaine, diphenhydrAMINE, alum & mag hydroxide) suspension Swish and swallow 5 mLs 4 (four) times daily as needed for mouth pain. 180 mL 1   metoprolol succinate (TOPROL-XL) 100 MG 24 hr tablet Take 100 mg by mouth daily.     omeprazole (PRILOSEC) 20 MG capsule Take 1 capsule (20 mg total) by mouth daily. 30 capsule 2   ondansetron (ZOFRAN) 8 MG tablet Take 1 tablet (8 mg total) by mouth every 8 (eight) hours as needed for nausea or vomiting (start 3 days; after chemo). 40 tablet 1   pravastatin (PRAVACHOL) 40 MG tablet Take 40 mg by mouth at bedtime.     prochlorperazine (COMPAZINE) 10 MG tablet Take 1 tablet (10 mg total) by mouth every 6 (six) hours as needed for nausea or vomiting. 40 tablet 1   sucralfate (CARAFATE) 1 GM/10ML suspension Take 10 mLs (1 g total) by mouth 4 (four) times daily -  with meals and at bedtime. 420 mL 0   No current facility-administered medications for this visit.    Facility-Administered Medications Ordered in Other Visits  Medication Dose Route Frequency Provider Last Rate Last Admin   0.9 %  sodium chloride infusion   Intravenous Continuous Faythe Heitzenrater, Kirt Boys, NP        VITAL SIGNS: BP 129/89    Pulse 72    Temp 97.9 F (36.6 C) (Tympanic)    Resp 16    SpO2 100%  There were no vitals filed for this visit.  Estimated body mass index is 25.29 kg/m as calculated from the following:   Height as of 05/02/21: 5\' 10"  (1.778 m).   Weight as of 05/09/21: 176 lb 4.1 oz (79.9 kg).  LABS: CBC:    Component Value Date/Time   WBC 3.7 (L) 05/12/2021 1000   HGB 12.6 (L) 05/12/2021 1000   HCT 35.5 (L) 05/12/2021 1000   PLT 80 (L) 05/12/2021 1000   MCV 85.3 05/12/2021 1000   NEUTROABS 2.7 05/12/2021 1000   LYMPHSABS 0.5 (L) 05/12/2021 1000   MONOABS 0.5 05/12/2021 1000   EOSABS 0.1 05/12/2021 1000   BASOSABS 0.0 05/12/2021 1000   Comprehensive Metabolic Panel:    Component Value Date/Time   NA 136 05/09/2021 0829   K 3.5 05/09/2021  0829   CL 103 05/09/2021 0829   CO2 24 05/09/2021 0829   BUN 24 (H) 05/09/2021 0829   CREATININE 0.98 05/09/2021 0829   GLUCOSE 122 (H) 05/09/2021 0829   CALCIUM 8.8 (L) 05/09/2021 0829   AST 18 05/09/2021 0829   ALT 15 05/09/2021 0829   ALKPHOS 51 05/09/2021 0829   BILITOT 0.5 05/09/2021 0829   PROT 6.4 (L) 05/09/2021 0829   ALBUMIN 3.5 05/09/2021 0829    RADIOGRAPHIC STUDIES: No results found.  PERFORMANCE STATUS (ECOG) : 1 - Symptomatic but completely ambulatory  Review of Systems Unless otherwise noted, a complete review of systems is negative.  Physical Exam General: NAD HEENT: OP clear, no overt thrush Cardiovascular: regular rate and rhythm Pulmonary: clear ant fields Abdomen: soft, nontender, + bowel sounds GU: no suprapubic tenderness Extremities: no edema, no joint deformities Skin: no rashes Neurological: Weakness but otherwise nonfocal  Assessment and Plan- Patient is a 76 y.o. 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  Complete course of Diflucan.  Continue PPI/Carafate.  Poor oral intake -likely attributed to chemo effects.  Discussed importance of high-protein/high-calorie foods.  Recommended smaller more frequent meals throughout the day.  Recommended addition of oral nutritional supplements.  Patient is followed by nutritionist.  Dehydration -mild hyponatremia.  We will proceed with IV fluids today.  Patient RTC to see Dr. Rogue Bussing on 3/6 or we can see him sooner if needed   Patient expressed understanding and was in agreement with this plan. He also understands that He can call clinic at any time with any questions, concerns, or complaints.   Thank you for allowing me to participate in the care of this very pleasant patient.   Time Total: 15 minutes  Visit consisted of counseling and education dealing with the complex and emotionally intense issues of symptom management in the setting of serious illness.Greater than 50%  of this time was spent counseling and coordinating care related to the above assessment and plan.  Signed by: Altha Harm, PhD, NP-C

## 2021-05-13 ENCOUNTER — Ambulatory Visit
Admission: RE | Admit: 2021-05-13 | Discharge: 2021-05-13 | Disposition: A | Discharge status: Home / Self Care | Payer: Medicare Other | Source: Ambulatory Visit | Attending: Radiation Oncology | Admitting: Radiation Oncology

## 2021-05-13 DIAGNOSIS — Z5111 Encounter for antineoplastic chemotherapy: Secondary | ICD-10-CM | POA: Diagnosis not present

## 2021-05-16 ENCOUNTER — Ambulatory Visit
Admission: RE | Admit: 2021-05-16 | Discharge: 2021-05-16 | Disposition: A | Discharge status: Home / Self Care | Payer: Medicare Other | Source: Ambulatory Visit | Attending: Radiation Oncology | Admitting: Radiation Oncology

## 2021-05-16 ENCOUNTER — Inpatient Hospital Stay: Payer: Medicare Other

## 2021-05-16 ENCOUNTER — Encounter: Payer: Self-pay | Admitting: Internal Medicine

## 2021-05-16 ENCOUNTER — Other Ambulatory Visit: Payer: Self-pay

## 2021-05-16 ENCOUNTER — Inpatient Hospital Stay (HOSPITAL_BASED_OUTPATIENT_CLINIC_OR_DEPARTMENT_OTHER): Payer: Medicare Other | Admitting: Internal Medicine

## 2021-05-16 ENCOUNTER — Telehealth: Payer: Self-pay

## 2021-05-16 DIAGNOSIS — C12 Malignant neoplasm of pyriform sinus: Secondary | ICD-10-CM | POA: Diagnosis not present

## 2021-05-16 DIAGNOSIS — Z5111 Encounter for antineoplastic chemotherapy: Secondary | ICD-10-CM | POA: Diagnosis not present

## 2021-05-16 LAB — COMPREHENSIVE METABOLIC PANEL
ALT: 15 U/L (ref 0–44)
AST: 19 U/L (ref 15–41)
Albumin: 3.6 g/dL (ref 3.5–5.0)
Alkaline Phosphatase: 55 U/L (ref 38–126)
Anion gap: 11 (ref 5–15)
BUN: 22 mg/dL (ref 8–23)
CO2: 24 mmol/L (ref 22–32)
Calcium: 8.9 mg/dL (ref 8.9–10.3)
Chloride: 100 mmol/L (ref 98–111)
Creatinine, Ser: 1.1 mg/dL (ref 0.61–1.24)
GFR, Estimated: >60 mL/min (ref >60)
Glucose, Bld: 110 mg/dL — ABNORMAL HIGH (ref 70–99)
Potassium: 3.2 mmol/L — ABNORMAL LOW (ref 3.5–5.1)
Sodium: 135 mmol/L (ref 135–145)
Total Bilirubin: 0.7 mg/dL (ref 0.3–1.2)
Total Protein: 6.4 g/dL — ABNORMAL LOW (ref 6.5–8.1)

## 2021-05-16 LAB — CBC WITH DIFFERENTIAL/PLATELET
Abs Immature Granulocytes: 0.01 10*3/uL (ref 0.00–0.07)
Basophils Absolute: 0 10*3/uL (ref 0.0–0.1)
Basophils Relative: 1 %
Eosinophils Absolute: 0.1 10*3/uL (ref 0.0–0.5)
Eosinophils Relative: 2 %
HCT: 35.7 % — ABNORMAL LOW (ref 39.0–52.0)
Hemoglobin: 12.8 g/dL — ABNORMAL LOW (ref 13.0–17.0)
Immature Granulocytes: 0 %
Lymphocytes Relative: 14 %
Lymphs Abs: 0.4 10*3/uL — ABNORMAL LOW (ref 0.7–4.0)
MCH: 30.2 pg (ref 26.0–34.0)
MCHC: 35.9 g/dL (ref 30.0–36.0)
MCV: 84.2 fL (ref 80.0–100.0)
Monocytes Absolute: 0.3 10*3/uL (ref 0.1–1.0)
Monocytes Relative: 9 %
Neutro Abs: 2.3 10*3/uL (ref 1.7–7.7)
Neutrophils Relative %: 74 %
Platelets: 86 10*3/uL — ABNORMAL LOW (ref 150–400)
RBC: 4.24 MIL/uL (ref 4.22–5.81)
RDW: 12 % (ref 11.5–15.5)
WBC: 3.1 10*3/uL — ABNORMAL LOW (ref 4.0–10.5)
nRBC: 0 % (ref 0.0–0.2)

## 2021-05-16 LAB — MAGNESIUM: Magnesium: 1.6 mg/dL — ABNORMAL LOW (ref 1.7–2.4)

## 2021-05-16 MED ORDER — SODIUM CHLORIDE 0.9 % IV SOLN
150.0000 mg | Freq: Once | INTRAVENOUS | Status: AC
  Administered 2021-05-16: 150 mg via INTRAVENOUS
  Filled 2021-05-16: qty 150

## 2021-05-16 MED ORDER — MAGNESIUM SULFATE 2 GM/50ML IV SOLN
2.0000 g | Freq: Once | INTRAVENOUS | Status: AC
  Administered 2021-05-16: 2 g via INTRAVENOUS
  Filled 2021-05-16: qty 50

## 2021-05-16 MED ORDER — SODIUM CHLORIDE 0.9 % IV SOLN
Freq: Once | INTRAVENOUS | Status: AC
  Filled 2021-05-16: qty 250

## 2021-05-16 MED ORDER — SODIUM CHLORIDE 0.9 % IV SOLN
10.0000 mg | Freq: Once | INTRAVENOUS | Status: AC
  Administered 2021-05-16: 10 mg via INTRAVENOUS
  Filled 2021-05-16: qty 10

## 2021-05-16 MED ORDER — PALONOSETRON HCL INJECTION 0.25 MG/5ML
0.2500 mg | Freq: Once | INTRAVENOUS | Status: AC
  Administered 2021-05-16: 0.25 mg via INTRAVENOUS
  Filled 2021-05-16: qty 5

## 2021-05-16 MED ORDER — POTASSIUM CHLORIDE 20 MEQ/100ML IV SOLN
20.0000 meq | Freq: Once | INTRAVENOUS | Status: AC
  Administered 2021-05-16: 20 meq via INTRAVENOUS

## 2021-05-16 MED ORDER — HEPARIN SOD (PORK) LOCK FLUSH 100 UNIT/ML IV SOLN
500.0000 [IU] | Freq: Once | INTRAVENOUS | Status: AC | PRN
  Administered 2021-05-16: 500 [IU]
  Filled 2021-05-16: qty 5

## 2021-05-16 MED ORDER — SODIUM CHLORIDE 0.9 % IV SOLN
40.0000 mg/m2 | Freq: Once | INTRAVENOUS | Status: AC
  Administered 2021-05-16: 82 mg via INTRAVENOUS
  Filled 2021-05-16: qty 82

## 2021-05-16 MED ORDER — POTASSIUM CHLORIDE IN NACL 20-0.9 MEQ/L-% IV SOLN
Freq: Once | INTRAVENOUS | Status: AC
  Filled 2021-05-16: qty 1000

## 2021-05-16 NOTE — Progress Notes (Signed)
Pt states he is weak, having a sore throat, and nothing tastes good. ?

## 2021-05-16 NOTE — Telephone Encounter (Signed)
Per Nancee Liter: Travis Arellano is scheduled for IR GTube placement  Fri 3/10 '@10a'$   Arrive '@9a'$ . ? ?Pt notified. ?

## 2021-05-16 NOTE — Progress Notes (Signed)
Patient on schedule for PEG placement 05/20/2021, called and spoke with patient on phone with pre procedure instructions given. Made aware to be here @ 0900, NPO after MN prior to procedure and driver post procedure/recovery/discharge. Will come prior to day of procedure to pick up Barium to drink [phone] night prior to procedure. Stated understanding. ?

## 2021-05-16 NOTE — Assessment & Plan Note (Addendum)
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 The left level IIa lymph node has activity less than blood pool, although its partially cystic appearance was still unusual and suspicious.  ? ?#Proceed with cisplatin weekly along with radiation [2/06-3/24].  Cycle #5 today.  Patient tolerating chemoradiation with expected side effects of weight loss sore throat; electrolyte imbalance see discussion below ? ?# Electrolyte imbalance: ?hypokalemia- 3.2; add additional 20 of KCl. ?Hypogmag 1.6 proceed with mag infusion-as planned with premeds.   ? 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 ?# Risk of weight loss / difficulty swallowing-counseled the patient regarding potential weight loss; moderate to severe mucositis from chemoradiation.  Given the significant weight loss in the last few months/starting therapy discussed the importance of PEG tube placement.  Patient finally agrees. Recommend magic mouth wash; see above.  Continue close monitoring with nutrition.  Informed Almyra Free. ? ?# GBG- 139; [non-diabetic]; awaiting HbA1c-; monitor BG closely n chemo ? ?# IV access/Mediport-s/p mediport- ? ?# DISPOSITION: ?# Referral to IR re: PEG tube placement ASAP ?# chemo today; give 20 KCL.   ?# in 1 week- labs- cbc/cmp;mag; cisplatin; possible KCL 20 meq IV; possible 2 gm Mag ?# follow up in 2 weeks- MD: labs- cbc/cmp/mag;- cisplatin weekly; possible IV KCL 20 meq/2 gm Mag-Dr.B ? ? ?

## 2021-05-16 NOTE — Patient Instructions (Signed)
[facility] AT [place]  Discharge Instructions: Thank you for choosing [facility] to provide your oncology and hematology care.  If you have a lab appointment with the [facility], please go directly to the [facility] and check in at the registration area.  Wear comfortable clothing and clothing appropriate for easy access to any Portacath or PICC line.   We strive to give you quality time with your provider. You may need to reschedule your appointment if you arrive late (15 or more minutes).  Arriving late affects you and other patients whose appointments are after yours.  Also, if you miss three or more appointments without notifying the office, you may be dismissed from the clinic at the providers discretion.      For prescription refill requests, have your pharmacy contact our office and allow 72 hours for refills to be completed.    Today you received the following chemotherapy and/or immunotherapy agents: Cisplatin      To help prevent nausea and vomiting after your treatment, we encourage you to take your nausea medication as directed.  BELOW ARE SYMPTOMS THAT SHOULD BE REPORTED IMMEDIATELY: *FEVER GREATER THAN 100.4 F (38 C) OR HIGHER *CHILLS OR SWEATING *NAUSEA AND VOMITING THAT IS NOT CONTROLLED WITH YOUR NAUSEA MEDICATION *UNUSUAL SHORTNESS OF BREATH *UNUSUAL BRUISING OR BLEEDING *URINARY PROBLEMS (pain or burning when urinating, or frequent urination) *BOWEL PROBLEMS (unusual diarrhea, constipation, pain near the anus) TENDERNESS IN MOUTH AND THROAT WITH OR WITHOUT PRESENCE OF ULCERS (sore throat, sores in mouth, or a toothache) UNUSUAL RASH, SWELLING OR PAIN  UNUSUAL VAGINAL DISCHARGE OR ITCHING   Items with * indicate a potential emergency and should be followed up as soon as possible or go to the Emergency Department if any problems should occur.  Please show the CHEMOTHERAPY ALERT CARD or IMMUNOTHERAPY ALERT CARD at check-in to  the Emergency Department and triage nurse.  Should you have questions after your visit or need to cancel or reschedule your appointment, please contact [facility] [place] AT [place]  [phone] and follow the prompts.  Office hours are 8:00 a.m. to 4:30 p.m. Monday - Friday. Please note that voicemails left after 4:00 p.m. may not be returned until the following business day.  We are closed weekends and major holidays. You have access to a nurse at all times for urgent questions. Please call the main number to the clinic [phone] and follow the prompts.  For any non-urgent questions, you may also contact your provider using MyChart. We now offer e-Visits for anyone 57 and older to request care online for non-urgent symptoms. For details visit [email or web address].   Also download the MyChart app! Go to the app store, search "MyChart", open the app, select [facility], and log in with your MyChart username and password.  Due to Covid, a mask is required upon entering the hospital/clinic. If you do not have a mask, one will be given to you upon arrival. For doctor visits, patients may have 1 support person aged 77 or older with them. For treatment visits, patients cannot have anyone with them due to current Covid guidelines and our immunocompromised population. Cisplatin injection What is this medication? CISPLATIN (SIS pla tin) is a chemotherapy drug. It targets fast dividing cells, like cancer cells, and causes these cells to die. This medicine is used to treat many types of cancer like bladder, ovarian, and testicular cancers. This medicine may be used for other purposes; ask your health care provider  or pharmacist if you have questions. COMMON BRAND NAME(S): Platinol, Platinol -AQ What should I tell my care team before I take this medication? They need to know if you have any of these conditions: eye disease, vision problems hearing problems kidney disease low blood  counts, like white cells, platelets, or red blood cells tingling of the fingers or toes, or other nerve disorder an unusual or allergic reaction to cisplatin, carboplatin, oxaliplatin, other medicines, foods, dyes, or preservatives pregnant or trying to get pregnant breast-feeding How should I use this medication? This drug is given as an infusion into a vein. It is administered in a hospital or clinic by a specially trained health care professional. Talk to your pediatrician regarding the use of this medicine in children. Special care may be needed. Overdosage: If you think you have taken too much of this medicine contact a poison control center or emergency room at once. NOTE: This medicine is only for you. Do not share this medicine with others. What if I miss a dose? It is important not to miss a dose. Call your doctor or health care professional if you are unable to keep an appointment. What may interact with this medication? This medicine may interact with the following medications: foscarnet certain antibiotics like amikacin, gentamicin, neomycin, polymyxin B, streptomycin, tobramycin, vancomycin This list may not describe all possible interactions. Give your health care provider a list of all the medicines, herbs, non-prescription drugs, or dietary supplements you use. Also tell them if you smoke, drink alcohol, or use illegal drugs. Some items may interact with your medicine. What should I watch for while using this medication? Your condition will be monitored carefully while you are receiving this medicine. You will need important blood work done while you are taking this medicine. This drug may make you feel generally unwell. This is not uncommon, as chemotherapy can affect healthy cells as well as cancer cells. Report any side effects. Continue your course of treatment even though you feel ill unless your doctor tells you to stop. This medicine may increase your risk of getting an  infection. Call your healthcare professional for advice if you get a fever, chills, or sore throat, or other symptoms of a cold or flu. Do not treat yourself. Try to avoid being around people who are sick. Avoid taking medicines that contain aspirin, acetaminophen, ibuprofen, naproxen, or ketoprofen unless instructed by your healthcare professional. These medicines may hide a fever. This medicine may increase your risk to bruise or bleed. Call your doctor or health care professional if you notice any unusual bleeding. Be careful brushing and flossing your teeth or using a toothpick because you may get an infection or bleed more easily. If you have any dental work done, tell your dentist you are receiving this medicine. Do not become pregnant while taking this medicine or for 14 months after stopping it. Women should inform their healthcare professional if they wish to become pregnant or think they might be pregnant. Men should not father a child while taking this medicine and for 11 months after stopping it. There is potential for serious side effects to an unborn child. Talk to your healthcare professional for more information. Do not breast-feed an infant while taking this medicine. This medicine has caused ovarian failure in some women. This medicine may make it more difficult to get pregnant. Talk to your healthcare professional if you are concerned about your fertility. This medicine has caused decreased sperm counts in some men. This may  make it more difficult to father a child. Talk to your healthcare professional if you are concerned about your fertility. Drink fluids as directed while you are taking this medicine. This will help protect your kidneys. Call your doctor or health care professional if you get diarrhea. Do not treat yourself. What side effects may I notice from receiving this medication? Side effects that you should report to your doctor or health care professional as soon as  possible: allergic reactions like skin rash, itching or hives, swelling of the face, lips, or tongue blurred vision changes in vision decreased hearing or ringing of the ears nausea, vomiting pain, redness, or irritation at site where injected pain, tingling, numbness in the hands or feet signs and symptoms of bleeding such as bloody or black, tarry stools; red or dark brown urine; spitting up blood or brown material that looks like coffee grounds; red spots on the skin; unusual bruising or bleeding from the eyes, gums, or nose signs and symptoms of infection like fever; chills; cough; sore throat; pain or trouble passing urine signs and symptoms of kidney injury like trouble passing urine or change in the amount of urine signs and symptoms of low red blood cells or anemia such as unusually weak or tired; feeling faint or lightheaded; falls; breathing problems Side effects that usually do not require medical attention (report to your doctor or health care professional if they continue or are bothersome): loss of appetite mouth sores muscle cramps This list may not describe all possible side effects. Call your doctor for medical advice about side effects. You may report side effects to FDA at 1-800-FDA-1088. Where should I keep my medication? This drug is given in a hospital or clinic and will not be stored at home. NOTE: This sheet is a summary. It may not cover all possible information. If you have questions about this medicine, talk to your doctor, pharmacist, or health care provider.  2022 Elsevier/Gold Standard (2020-11-16 00:00:00)

## 2021-05-16 NOTE — Progress Notes (Signed)
[facility] NOTE  Patient Care Team: [facility] as PCP - General Rogue Bussing, Elisha Headland, MD as Consulting Physician (Oncology) Noreene Filbert, MD as Consulting Physician (Radiation Oncology) Clyde Canterbury, MD as Referring Physician (Otolaryngology)  CHIEF COMPLAINTS/PURPOSE OF CONSULTATION: head and neck cancer  #  Oncology History Overview Note  DIAGNOSIS:  A. PIRIFORM SINUS LESION, LEFT; BIOPSY:  - INVASIVE SQUAMOUS CELL CARCINOMA, KERATINIZING.   Asymmetric soft tissue thickening and enhancement of the left aryepiglottic fold and adjacent paraglottic fat suspicious for malignancy. Abnormal right level 1/2 and left level 2 likely cystic/necrotic nodes.   A subcentimeter low-density lesion of the right vallecula probably reflects a cyst but direct visual inspection is recommended.  # JAN 2023-Pyriform sinus- SCC-[Dr.Bennett]clinically T1 [~1cm; N2a]; PET scan Jan 19th, 2023- The mucosal thickening along the left piriform sinus is highlyHypermetabolic'  The contralateral right level IIa lymph node is mildly hypermetabolic along its more solid-appearing anterior inferior border, maximum SUV 4.0. The left level IIa lymph node has activity less than blood pool, although its partially cystic appearance was still unusual and suspicious.   # FEB 6th, 2023- Cisplatin-RT        Cancer of pyriform sinus ([facility])  03/30/2021 Initial Diagnosis   Cancer of pyriform sinus ([facility])   04/18/2021 -  Chemotherapy   Patient is on Treatment Plan : HEAD/NECK Cisplatin q7d      Cancer Staging   Cancer Staging  No matching staging information was found for the patient.   04/18/2021 Cancer Staging   Staging form: Pharynx - Hypopharynx, AJCC 8th Edition - Clinical: Stage IVA (cT1, cN2c, cM0) - Signed by Cammie Sickle, MD on 04/18/2021       HISTORY OF PRESENTING ILLNESS: Ambulating independently.  Alone. Travis Arellano 76 y.o.  male newly  diagnosed pyriform sinus cancer/locally advanced currently on concurrent chemoradiation is here for follow-up.  In the interim patient has been evaluated by [facility]; nutrition.  Pt states he is weak, having a sore throat, and nothing tastes good.   Patient continues to complains of weight loss; complains of constipation.  Also complains of difficulty swallowing painful swallowing.  In the interim has met with nutrition.  Is not using hydrocodone liquid.  Using Tylenol.  Denies new shortness of breath or cough.  Review of Systems  Constitutional:  Positive for malaise/fatigue and weight loss. Negative for chills, diaphoresis and fever.  HENT:  Negative for nosebleeds and sore throat.   Eyes:  Negative for double vision.  Respiratory:  Negative for cough, hemoptysis, sputum production, shortness of breath and wheezing.   Cardiovascular:  Negative for chest pain, palpitations, orthopnea and leg swelling.  Gastrointestinal:  Negative for abdominal pain, blood in stool, constipation, diarrhea, heartburn, melena, nausea and vomiting.  Genitourinary:  Negative for dysuria, frequency and urgency.  Musculoskeletal:  Positive for back pain and joint pain.  Skin: Negative.  Negative for itching and rash.  Neurological:  Negative for dizziness, tingling, focal weakness, weakness and headaches.  Endo/Heme/Allergies:  Does not bruise/bleed easily.  Psychiatric/Behavioral:  Negative for depression. The patient is not nervous/anxious and does not have insomnia.     MEDICAL HISTORY:  Past Medical History:  Diagnosis Date   Cancer of pyriform sinus (HCC)    High cholesterol    Hypertension    Wears dentures    Full upper, partial lower    SURGICAL HISTORY: Past Surgical History:  Procedure Laterality Date   CATARACT EXTRACTION W/PHACO Right 05/17/2020  Procedure: CATARACT EXTRACTION PHACO AND INTRAOCULAR LENS PLACEMENT (IOC);  Surgeon: Baruch Goldmann, MD;  Location: [facility];  Service: Ophthalmology;   Laterality: Right;  CDE: 15.03   CATARACT EXTRACTION W/PHACO Left 06/11/2020   Procedure: CATARACT EXTRACTION PHACO AND INTRAOCULAR LENS PLACEMENT (IOC);  Surgeon: Baruch Goldmann, MD;  Location: [facility];  Service: Ophthalmology;  Laterality: Left;  CDE: 13.98   COLONOSCOPY     IR IMAGING GUIDED PORT INSERTION  04/05/2021   MICROLARYNGOSCOPY N/A 03/22/2021   Procedure: MICROSCOPIC DIRECT LARYNGOSCOPY WITH BIOPSY;  Surgeon: Clyde Canterbury, MD;  Location: [facility];  Service: ENT;  Laterality: N/A;   RIGID ESOPHAGOSCOPY N/A 03/22/2021   Procedure: RIGID ESOPHAGOSCOPY;  Surgeon: Clyde Canterbury, MD;  Location: [facility];  Service: ENT;  Laterality: N/A;    SOCIAL HISTORY: Social History   Socioeconomic History   Marital status: Divorced    Spouse name: Not on file   Number of children: Not on file   Years of education: Not on file   Highest education level: Not on file  Occupational History   Not on file  Tobacco Use   Smoking status: Former    Types: Cigarettes    Quit date: 1999    Years since quitting: 24.1   Smokeless tobacco: Never  Vaping Use   Vaping Use: Never used  Substance and Sexual Activity   Alcohol use: No   Drug use: No   Sexual activity: Not on file  Other Topics Concern   Not on file  Social History Narrative   Lives between [place] [[address]]; with son and brother. Quit smoking in 1999 [prior all life]; quit alcohol 30 years- used to drink beer. Retd/ Part time- auto-mechanic work.    Social Determinants of Health   Financial Resource Strain: Not on file  Food Insecurity: Not on file  Transportation Needs: Not on file  Physical Activity: Not on file  Stress: Not on file  Social Connections: Not on file  Intimate Partner Violence: Not on file    FAMILY HISTORY: Family History  Problem Relation Age of Onset   Congestive Heart Failure Mother    Diabetes Father    Lung cancer Brother     ALLERGIES:  has No Known  Allergies.  MEDICATIONS:  Current Outpatient Medications  Medication Sig Dispense Refill   doxazosin (CARDURA) 4 MG tablet Take 4 mg by mouth daily.     fluconazole (DIFLUCAN) 100 MG tablet Take 1 tablet (100 mg total) by mouth daily. 14 tablet 0   hydrochlorothiazide (MICROZIDE) 12.5 MG capsule Take 12.5 mg by mouth daily.     lidocaine-prilocaine (EMLA) cream Apply 1 application topically as needed. Apply to port and cover with saran wrap 1-2 hours prior to port access 30 g 1   lisinopril (PRINIVIL,ZESTRIL) 20 MG tablet Take 20 mg by mouth at bedtime.     magic mouthwash (nystatin, lidocaine, diphenhydrAMINE, alum & mag hydroxide) suspension Swish and swallow 5 mLs 4 (four) times daily as needed for mouth pain. 180 mL 1   metoprolol succinate (TOPROL-XL) 100 MG 24 hr tablet Take 100 mg by mouth daily.     omeprazole (PRILOSEC) 20 MG capsule Take 1 capsule (20 mg total) by mouth daily. 30 capsule 2   ondansetron (ZOFRAN) 8 MG tablet Take 1 tablet (8 mg total) by mouth every 8 (eight) hours as needed for nausea or vomiting (start 3 days; after chemo). 40 tablet 1   pravastatin (PRAVACHOL) 40 MG tablet  Take 40 mg by mouth at bedtime.     prochlorperazine (COMPAZINE) 10 MG tablet Take 1 tablet (10 mg total) by mouth every 6 (six) hours as needed for nausea or vomiting. 40 tablet 1   sucralfate (CARAFATE) 1 GM/10ML suspension Take 10 mLs (1 g total) by mouth 4 (four) times daily -  with meals and at bedtime. 420 mL 0   HYDROcodone-acetaminophen (HYCET) 7.5-325 mg/15 ml solution 10-15 cc PO every 4-6 hours as needed for pain (Patient not taking: Reported on 05/16/2021) 300 mL 0   No current facility-administered medications for this visit.     PHYSICAL EXAMINATION: ECOG PERFORMANCE STATUS: 1 - Symptomatic but completely ambulatory  Vitals:   05/16/21 0924 05/16/21 0947  BP: (!) 141/122 (!) 142/94  Pulse: 74 75  Temp: (!) 96.7 F (35.9 C)   SpO2: 100%    Filed Weights   05/16/21 0924   Weight: 171 lb (77.6 kg)    Physical Exam Vitals and nursing note reviewed.  HENT:     Head: Normocephalic and atraumatic.     Mouth/Throat:     Pharynx: Oropharynx is clear.  Eyes:     Extraocular Movements: Extraocular movements intact.     Pupils: Pupils are equal, round, and reactive to light.  Cardiovascular:     Rate and Rhythm: Normal rate and regular rhythm.  Pulmonary:     Comments: Decreased breath sounds bilaterally.  Abdominal:     Palpations: Abdomen is soft.  Musculoskeletal:        General: Normal range of motion.     Cervical back: Normal range of motion.  Skin:    General: Skin is warm.  Neurological:     General: No focal deficit present.     Mental Status: He is alert and oriented to person, place, and time.  Psychiatric:        Behavior: Behavior normal.        Judgment: Judgment normal.     LABORATORY DATA:  I have reviewed the data as listed Lab Results  Component Value Date   WBC 3.1 (L) 05/16/2021   HGB 12.8 (L) 05/16/2021   HCT 35.7 (L) 05/16/2021   MCV 84.2 05/16/2021   PLT 86 (L) 05/16/2021   Recent Labs    05/09/21 0829 05/12/21 1000 05/16/21 0847  NA 136 133* 135  K 3.5 3.6 3.2*  CL 103 100 100  CO2 $Re'24 27 24  'uBP$ GLUCOSE 122* 115* 110*  BUN 24* 20 22  CREATININE 0.98 1.01 1.10  CALCIUM 8.8* 8.5* 8.9  GFRNONAA >60 >60 >60  PROT 6.4* 6.3* 6.4*  ALBUMIN 3.5 3.5 3.6  AST $Re'18 16 19  'POI$ ALT $R'15 14 15  'XG$ ALKPHOS 51 52 55  BILITOT 0.5 0.6 0.7    RADIOGRAPHIC STUDIES: I have personally reviewed the radiological images as listed and agreed with the findings in the report. No results found.  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 The left level IIa lymph node has activity less than blood pool, although its partially cystic appearance was still unusual and suspicious.   #Proceed with cisplatin weekly along with radiation [2/06-3/24].  Cycle #5 today.  Patient tolerating chemoradiation with expected side effects of  weight loss sore throat; electrolyte imbalance see discussion below  # Electrolyte imbalance: hypokalemia- 3.2; add additional 20 of KCl. Hypogmag 1.6 proceed with mag infusion-as planned with premeds.    # Throat pain- sec to malignancy/radiation mucositis- on magic mouth wash; carafate  q 8 hour prn]; recommend using hycet liquid [has filled]-see below  # Risk of weight loss / difficulty swallowing-counseled the patient regarding potential weight loss; moderate to severe mucositis from chemoradiation.  Given the significant weight loss in the last few months/starting therapy discussed the importance of PEG tube placement.  Patient finally agrees. Recommend magic mouth wash; see above.  Continue close monitoring with nutrition.  Informed Almyra Free.  # GBG- 139; [non-diabetic]; awaiting HbA1c-; monitor BG closely n chemo  # IV access/Mediport-s/p mediport-  # DISPOSITION: # Referral to IR re: PEG tube placement ASAP # chemo today; give 20 KCL.   # in 1 week- labs- cbc/cmp;mag; cisplatin; possible KCL 20 meq IV; possible 2 gm Mag # follow up in 2 weeks- MD: labs- cbc/cmp/mag;- cisplatin weekly; possible IV KCL 20 meq/2 gm Mag-Dr.B      All questions were answered. The patient knows to call the clinic with any problems, questions or concerns.       Cammie Sickle, MD 05/16/2021 7:36 PM

## 2021-05-17 ENCOUNTER — Ambulatory Visit
Admission: RE | Admit: 2021-05-17 | Discharge: 2021-05-17 | Disposition: A | Discharge status: Home / Self Care | Payer: Medicare Other | Source: Ambulatory Visit | Attending: Radiation Oncology | Admitting: Radiation Oncology

## 2021-05-17 DIAGNOSIS — Z5111 Encounter for antineoplastic chemotherapy: Secondary | ICD-10-CM | POA: Diagnosis not present

## 2021-05-18 ENCOUNTER — Ambulatory Visit
Admission: RE | Admit: 2021-05-18 | Discharge: 2021-05-18 | Disposition: A | Discharge status: Home / Self Care | Payer: Medicare Other | Source: Ambulatory Visit | Attending: Radiation Oncology | Admitting: Radiation Oncology

## 2021-05-18 DIAGNOSIS — Z5111 Encounter for antineoplastic chemotherapy: Secondary | ICD-10-CM | POA: Diagnosis not present

## 2021-05-19 ENCOUNTER — Other Ambulatory Visit: Payer: Self-pay

## 2021-05-19 ENCOUNTER — Telehealth: Payer: Self-pay

## 2021-05-19 ENCOUNTER — Ambulatory Visit
Admission: RE | Admit: 2021-05-19 | Discharge: 2021-05-19 | Disposition: A | Discharge status: Home / Self Care | Payer: Medicare Other | Source: Ambulatory Visit | Attending: Radiation Oncology | Admitting: Radiation Oncology

## 2021-05-19 ENCOUNTER — Inpatient Hospital Stay: Payer: Medicare Other

## 2021-05-19 ENCOUNTER — Other Ambulatory Visit: Payer: Self-pay | Admitting: Student

## 2021-05-19 DIAGNOSIS — Z5111 Encounter for antineoplastic chemotherapy: Secondary | ICD-10-CM | POA: Diagnosis not present

## 2021-05-19 DIAGNOSIS — C12 Malignant neoplasm of pyriform sinus: Secondary | ICD-10-CM

## 2021-05-19 MED ORDER — ISOSOURCE 1.5 CAL PO LIQD: ORAL | 0 refills | Status: DC

## 2021-05-19 NOTE — Progress Notes (Addendum)
Nutrition Follow-up: ? ?Patient with stage IV pyriform sinus cancer.  Patient receiving concurrent chemotherapy and radiation.  Planning PEG placement in IR tomorrow (3/10). Anticipate long term need of feeding tube during treatment and recovery.   ? ?Met with patient following radiation.  Patient reports that he is not eating much due to throat pain or drinking much. No taste.   ? ? ?Medications: magic mouthwash, hycet, prilosec, zofran, compazine, carafate ? ?Labs: 3/6 K 3.2, glucose 110, Mag 1.6 ? ?Anthropometrics:  ? ?Weight 171 lb on 3/6 ? ?176 lb 4.1 oz on 2/27 ?182 lb 9.6 oz on 2/13 ?186 lb 14.4 oz on 2/6 ?194 lb 3.2 oz on 1/27 ? ?12% weight loss in the last month, signficiant ? ? ?Re-Estimated Energy Needs ? ?Kcals: 2300-2700 ?Protein: 115-135 g ?Fluid: 2300-2700ml ? ?NUTRITION DIAGNOSIS: Inadequate oral intake continues ? ? ?MALNUTRITION DIAGNOSIS: Severe malnutrition continues ? ? ?INTERVENTION:  ?Educated patient on tube care, how to give bolus feeding and flush and tube feeding regimen.  Written materials given to patient.  ?Recommend isosource 1.5, 7 cartons per day once at goal rate.  (8am 2 cartons, 11am, 2pm, 5pm, 8pm 1 carton). Flush with 120ml before and after each feeding.  Patient instructed to start on 3/11 with only 2 cartons of feeding/day, then increase by 1 carton daily until able to reach goal rate.   ?Once at goal will provide 2625 calories, 119 g protein, 2537ml free water (formula and water flush).  Meets 100% of calorie needs.   ?Patient at risk for refeeding syndrome, recommend checking CMET, Magnesium and phosphorus at least 2 times per week and supplementing if low.  MD informed ?Recommend adding MVI daily until goal rate and thiamine 100 mg for 10 days.   ?Patient agreeable to any DME for enteral supplies.  RD spoke with Pam, representative with [facility] regarding new referral for enteral nutrition.   ?Patient aware that he can continue to eat orally as able with  feeding tube.   ?  ? ?MONITORING, EVALUATION, GOAL: weight trends, intake, tube feeding ? ? ?NEXT VISIT: Tuesday, March 14 phone call ? ?Joli B. Allen, RD, LDN ?Registered Dietitian ?[phone] (mobile) ? ? ?

## 2021-05-19 NOTE — Addendum Note (Signed)
Addended by: Jennet Maduro B on: 05/19/2021 12:33 PM ? ? Modules accepted: Orders ? ?

## 2021-05-19 NOTE — Telephone Encounter (Signed)
Labs entered.

## 2021-05-19 NOTE — Telephone Encounter (Signed)
-----   Message from Jennet Maduro, [place] sent at 05/19/2021 11:33 AM EST ----- ?Dr B, ? ?I have provided education to Travis Arellano regarding feeding tube care, how to deliver feeding and tube feeding regimen this morning. I am recommending isource 1.5 (tube feeding) 7 cartons per day (8am 2 carton, 11am, 2pm, 5pm and 8pm 1 carton at each of these feedings).  I have placed a tube feeding order in [facility] that you will need to co-sign.  I have contacted Pam, representative with [facility] to provide enteral supplies to patient.  [facility] will likely fax paperwork for you to sign and return to them.   ? ?Travis Arellano is at risk of refeeding syndrome and he needs CMET, Mag, Phosphorus checked on Monday, March 13 when he comes for treatment.  If electrolytes are low (K, Phosphorus, Mag), please replete.  Will need these labs rechecked on Thursday, March 16.  He will titrate feeding slowly due to refeeding risk. Because of his refeeding risk, I have asked him to start a daily MVI and thiamine '100mg'$  for 10 days.  He will pick these up today.   ? ?Please let me know if you have questions.   ? ?Will follow with you.   ? ?Joli.   ? ?

## 2021-05-20 ENCOUNTER — Encounter: Payer: Self-pay | Admitting: Internal Medicine

## 2021-05-20 ENCOUNTER — Encounter: Payer: Self-pay | Admitting: Radiology

## 2021-05-20 ENCOUNTER — Ambulatory Visit
Admission: RE | Admit: 2021-05-20 | Discharge: 2021-05-20 | Disposition: A | Discharge status: Home / Self Care | Payer: Medicare Other | Source: Ambulatory Visit | Attending: Radiation Oncology | Admitting: Radiation Oncology

## 2021-05-20 ENCOUNTER — Ambulatory Visit
Admission: RE | Admit: 2021-05-20 | Discharge: 2021-05-20 | Disposition: A | Discharge status: Home / Self Care | Payer: Medicare Other | Source: Ambulatory Visit | Attending: Internal Medicine | Admitting: Internal Medicine

## 2021-05-20 ENCOUNTER — Other Ambulatory Visit: Payer: Self-pay

## 2021-05-20 ENCOUNTER — Other Ambulatory Visit: Payer: Self-pay | Admitting: Radiology

## 2021-05-20 DIAGNOSIS — I10 Essential (primary) hypertension: Secondary | ICD-10-CM | POA: Insufficient documentation

## 2021-05-20 DIAGNOSIS — Z5111 Encounter for antineoplastic chemotherapy: Secondary | ICD-10-CM | POA: Diagnosis not present

## 2021-05-20 DIAGNOSIS — J029 Acute pharyngitis, unspecified: Secondary | ICD-10-CM | POA: Insufficient documentation

## 2021-05-20 DIAGNOSIS — E7800 Pure hypercholesterolemia, unspecified: Secondary | ICD-10-CM | POA: Insufficient documentation

## 2021-05-20 DIAGNOSIS — C12 Malignant neoplasm of pyriform sinus: Secondary | ICD-10-CM | POA: Diagnosis present

## 2021-05-20 HISTORY — PX: IR GASTROSTOMY TUBE MOD SED: IMG625

## 2021-05-20 LAB — PROTIME-INR
INR: 1 (ref 0.8–1.2)
Prothrombin Time: 13.6 seconds (ref 11.4–15.2)

## 2021-05-20 LAB — CBC
HCT: 34 % — ABNORMAL LOW (ref 39.0–52.0)
Hemoglobin: 12.1 g/dL — ABNORMAL LOW (ref 13.0–17.0)
MCH: 29.7 pg (ref 26.0–34.0)
MCHC: 35.6 g/dL (ref 30.0–36.0)
MCV: 83.3 fL (ref 80.0–100.0)
Platelets: 85 10*3/uL — ABNORMAL LOW (ref 150–400)
RBC: 4.08 MIL/uL — ABNORMAL LOW (ref 4.22–5.81)
RDW: 12.4 % (ref 11.5–15.5)
WBC: 2.7 10*3/uL — ABNORMAL LOW (ref 4.0–10.5)
nRBC: 0 % (ref 0.0–0.2)

## 2021-05-20 MED ORDER — FENTANYL CITRATE (PF) 100 MCG/2ML IJ SOLN
INTRAMUSCULAR | Status: AC | PRN
  Administered 2021-05-20: 50 ug via INTRAVENOUS
  Administered 2021-05-20: 25 ug via INTRAVENOUS

## 2021-05-20 MED ORDER — FENTANYL CITRATE (PF) 100 MCG/2ML IJ SOLN
INTRAMUSCULAR | Status: AC
  Filled 2021-05-20: qty 2

## 2021-05-20 MED ORDER — CEFAZOLIN SODIUM-DEXTROSE 2-4 GM/100ML-% IV SOLN
2.0000 g | INTRAVENOUS | Status: AC
Start: 2021-05-20 — End: 2021-05-20
  Administered 2021-05-20: 2 g via INTRAVENOUS

## 2021-05-20 MED ORDER — HEPARIN SOD (PORK) LOCK FLUSH 100 UNIT/ML IV SOLN
INTRAVENOUS | Status: AC
  Filled 2021-05-20: qty 5

## 2021-05-20 MED ORDER — LIDOCAINE VISCOUS HCL 2 % MT SOLN
OROMUCOSAL | Status: AC
  Filled 2021-05-20: qty 15

## 2021-05-20 MED ORDER — MIDAZOLAM HCL 2 MG/2ML IJ SOLN
INTRAMUSCULAR | Status: AC | PRN
  Administered 2021-05-20: .5 mg via INTRAVENOUS
  Administered 2021-05-20: 1 mg via INTRAVENOUS

## 2021-05-20 MED ORDER — MIDAZOLAM HCL 2 MG/2ML IJ SOLN
INTRAMUSCULAR | Status: AC
  Filled 2021-05-20: qty 4

## 2021-05-20 MED ORDER — LIDOCAINE HCL 1 % IJ SOLN
INTRAMUSCULAR | Status: AC
  Administered 2021-05-20: 15 mL
  Filled 2021-05-20: qty 20

## 2021-05-20 MED ORDER — HYDROCODONE-ACETAMINOPHEN 5-325 MG PO TABS: 1.0000 | ORAL_TABLET | Freq: Four times a day (QID) | ORAL | 0 refills | Status: DC | PRN

## 2021-05-20 MED ORDER — SODIUM CHLORIDE 0.9 % IV SOLN
INTRAVENOUS | Status: DC
  Filled 2021-05-20: qty 1000

## 2021-05-20 MED ORDER — GLUCAGON HCL RDNA (DIAGNOSTIC) 1 MG IJ SOLR
INTRAMUSCULAR | Status: AC | PRN
  Administered 2021-05-20: 5 mg via INTRAVENOUS

## 2021-05-20 MED ORDER — GLUCAGON HCL RDNA (DIAGNOSTIC) 1 MG IJ SOLR
INTRAMUSCULAR | Status: AC
  Filled 2021-05-20: qty 1

## 2021-05-20 MED ORDER — IOHEXOL 350 MG/ML SOLN
8.0000 mL | Freq: Once | INTRAVENOUS | Status: AC | PRN
  Administered 2021-05-20: 8 mL
  Filled 2021-05-20: qty 10

## 2021-05-20 NOTE — Telephone Encounter (Signed)
Appts made

## 2021-05-20 NOTE — H&P (Signed)
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 Request is for a gastrostomy tube placement.  Referring Physician(s): Cammie Sickle  Supervising Physician: Michaelle Birks  Patient Status: [facility] - Out-pt  History of Present Illness: Travis Arellano is a 76 y.o. male Known to IR. History of HTN , HLD. 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 Team is requesting a gastronomy time placement for nutritional access.    Patient alert and laying in bed, calm. Endorses sore throat. Denies any fevers, headache, chest pain, SOB, cough, abdominal pain, nausea, vomiting or bleeding. Return precautions and treatment recommendations and follow-up discussed with the patient  who is agreeable with the plan.    Past Medical History:  Diagnosis Date   Cancer of pyriform sinus (HCC)    High cholesterol    Hypertension    Wears dentures    Full upper, partial lower    Past Surgical History:  Procedure Laterality Date   CATARACT EXTRACTION W/PHACO Right 05/17/2020   Procedure: CATARACT EXTRACTION PHACO AND INTRAOCULAR LENS PLACEMENT (IOC);  Surgeon: Baruch Goldmann, MD;  Location: [facility];  Service: Ophthalmology;  Laterality: Right;  CDE: 15.03   CATARACT EXTRACTION W/PHACO Left 06/11/2020   Procedure: CATARACT EXTRACTION PHACO AND INTRAOCULAR LENS PLACEMENT (IOC);  Surgeon: Baruch Goldmann, MD;  Location: [facility];  Service: Ophthalmology;  Laterality: Left;  CDE: 13.98   COLONOSCOPY     IR IMAGING GUIDED PORT INSERTION  04/05/2021   MICROLARYNGOSCOPY N/A 03/22/2021   Procedure: MICROSCOPIC DIRECT LARYNGOSCOPY WITH BIOPSY;  Surgeon: Clyde Canterbury, MD;  Location: [place];  Service: ENT;  Laterality: N/A;   RIGID ESOPHAGOSCOPY N/A 03/22/2021   Procedure: RIGID ESOPHAGOSCOPY;  Surgeon: Clyde Canterbury, MD;  Location: [place];  Service: ENT;  Laterality: N/A;    Allergies: Patient has no known  allergies.  Medications: Prior to Admission medications   Medication Sig Start Date End Date Taking? Authorizing Provider  doxazosin (CARDURA) 4 MG tablet Take 4 mg by mouth daily. 04/07/20   [provider]  fluconazole (DIFLUCAN) 100 MG tablet Take 1 tablet (100 mg total) by mouth daily. 05/09/21   Borders, Kirt Boys, NP  hydrochlorothiazide (MICROZIDE) 12.5 MG capsule Take 12.5 mg by mouth daily.    [provider]  HYDROcodone-acetaminophen (HYCET) 7.5-325 mg/15 ml solution 10-15 cc PO every 4-6 hours as needed for pain Patient not taking: Reported on 05/16/2021 03/22/21   Clyde Canterbury, MD  lidocaine-prilocaine (EMLA) cream Apply 1 application topically as needed. Apply to port and cover with saran wrap 1-2 hours prior to port access 04/08/21   Cammie Sickle, MD  lisinopril (PRINIVIL,ZESTRIL) 20 MG tablet Take 20 mg by mouth at bedtime. 02/26/12   [provider]  magic mouthwash (nystatin, lidocaine, diphenhydrAMINE, alum & mag hydroxide) suspension Swish and swallow 5 mLs 4 (four) times daily as needed for mouth pain. 04/25/21   Borders, Kirt Boys, NP  metoprolol succinate (TOPROL-XL) 100 MG 24 hr tablet Take 100 mg by mouth daily. 04/07/20   [provider]  Nutritional Supplements (ISOSOURCE 1.5 CAL) LIQD Give 2 cartons at 8am. Give 1 carton at 11am, 2pm, 5pm and 8pm. Flush with 120 ml of water before and after each feeding. Send bolus tube feeding supplies.  [place] RN starting on 05/23/21 to provide support for TF management in the home.  Start with 2 cartons daily and then add 1 carton each day until at goal rate. 05/19/21  Cammie Sickle, MD  omeprazole (PRILOSEC) 20 MG capsule Take 1 capsule (20 mg total) by mouth daily. 05/09/21   Borders, Kirt Boys, NP  ondansetron (ZOFRAN) 8 MG tablet Take 1 tablet (8 mg total) by mouth every 8 (eight) hours as needed for nausea or vomiting (start 3 days; after chemo). 04/08/21   Cammie Sickle, MD  pravastatin  (PRAVACHOL) 40 MG tablet Take 40 mg by mouth at bedtime.    [provider]  prochlorperazine (COMPAZINE) 10 MG tablet Take 1 tablet (10 mg total) by mouth every 6 (six) hours as needed for nausea or vomiting. 04/08/21   Cammie Sickle, MD  sucralfate (CARAFATE) 1 GM/10ML suspension Take 10 mLs (1 g total) by mouth 4 (four) times daily -  with meals and at bedtime. 05/09/21   Borders, Kirt Boys, NP     Family History  Problem Relation Age of Onset   Congestive Heart Failure Mother    Diabetes Father    Lung cancer Brother     Social History   Socioeconomic History   Marital status: Divorced    Spouse name: Not on file   Number of children: Not on file   Years of education: Not on file   Highest education level: Not on file  Occupational History   Not on file  Tobacco Use   Smoking status: Former    Types: Cigarettes    Quit date: 1999    Years since quitting: 24.2   Smokeless tobacco: Never  Vaping Use   Vaping Use: Never used  Substance and Sexual Activity   Alcohol use: No   Drug use: No   Sexual activity: Not on file  Other Topics Concern   Not on file  Social History Narrative   Lives between [place] [30 mins drive]; with son and brother. Quit smoking in 1999 [prior all life]; quit alcohol 30 years- used to drink beer. Retd/ Part time- auto-mechanic work.    Social Determinants of Health   Financial Resource Strain: Not on file  Food Insecurity: Not on file  Transportation Needs: Not on file  Physical Activity: Not on file  Stress: Not on file  Social Connections: Not on file    Review of Systems: A 12 point ROS discussed and pertinent positives are indicated in the HPI above.  All other systems are negative.  Review of Systems  Constitutional:  Negative for fever.  HENT:  Positive for sore throat. Negative for congestion.   Respiratory:  Negative for cough and shortness of breath.   Cardiovascular:  Negative for chest pain.   Gastrointestinal:  Negative for abdominal pain.  Neurological:  Negative for headaches.  Psychiatric/Behavioral:  Negative for behavioral problems and confusion.    Vital Signs: There were no vitals taken for this visit.  Physical Exam Vitals and nursing note reviewed.  Constitutional:      Appearance: He is well-developed.  HENT:     Head: Normocephalic.  Cardiovascular:     Rate and Rhythm: Normal rate and regular rhythm.     Heart sounds: Normal heart sounds.  Pulmonary:     Effort: Pulmonary effort is normal.     Breath sounds: Normal breath sounds.  Musculoskeletal:        General: Normal range of motion.     Cervical back: Normal range of motion.  Skin:    General: Skin is dry.  Neurological:     Mental Status: He is alert and oriented to person,  place, and time.    Imaging: No results found.  Labs:  CBC: Recent Labs    05/02/21 0835 05/09/21 0829 05/12/21 1000 05/16/21 0847  WBC 6.7 4.4 3.7* 3.1*  HGB 13.7 13.4 12.6* 12.8*  HCT 40.1 38.1* 35.5* 35.7*  PLT 153 124* 80* 86*    COAGS: No results for input(s): INR, APTT in the last 8760 hours.  BMP: Recent Labs    05/02/21 0835 05/09/21 0829 05/12/21 1000 05/16/21 0847  NA 134* 136 133* 135  K 3.5 3.5 3.6 3.2*  CL 101 103 100 100  CO2 '24 24 27 24  '$ GLUCOSE 139* 122* 115* 110*  BUN 26* 24* 20 22  CALCIUM 9.0 8.8* 8.5* 8.9  CREATININE 1.12 0.98 1.01 1.10  GFRNONAA >60 >60 >60 >60    LIVER FUNCTION TESTS: Recent Labs    05/02/21 0835 05/09/21 0829 05/12/21 1000 05/16/21 0847  BILITOT 0.6 0.5 0.6 0.7  AST '20 18 16 19  '$ ALT '21 15 14 15  '$ ALKPHOS 62 51 52 55  PROT 6.7 6.4* 6.3* 6.4*  ALBUMIN 3.7 3.5 3.5 3.6      Assessment and Plan:  76 y.o. male. Known to IR. History of HTN , HLD. 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 Team is requesting a gastronomy time placement for nutritional access.   PET scan from 1.19.23 shows the stomach in an  accessible position for percutaneous access. IR performed a portacath placement on 1.23.24.  Labs pending. Labs from 3.6.23 shows potassium 3.2, WBC 3.1, Total protein 6.4, Magnesium 1.6. All medications are within acceptable parameters. NKDA. Patient drank barium as requested last night. Patient has been NPO since midnight   Risks and benefits image guided gastrostomy tube placement was discussed with the patient including, but not limited to the need for a barium enema during the procedure, bleeding, infection, peritonitis and/or damage to adjacent structures.  All of the patient's questions were answered, patient is agreeable to proceed.  Consent signed and in chart.   Thank you for this interesting consult.  I greatly enjoyed meeting Travis Arellano and look forward to participating in their care.  A copy of this report was sent to the requesting provider on this date.  Electronically Signed: Jacqualine Mau, NP 05/20/2021, 9:22 AM   I spent a total of  30 Minutes   in face to face in clinical consultation, greater than 50% of which was counseling/coordinating care for gastrostomy tube placement.

## 2021-05-20 NOTE — Procedures (Signed)
Vascular and Interventional Radiology Procedure Note ? ?Patient: Travis Arellano ?DOB: 04/26/1945 ?Medical Record Number: [id number] ?Note Date/Time: 05/20/21 11:39 AM  ? ?Performing Physician: Michaelle Birks, MD ?Assistant(s): None ? ?Diagnosis: Head and Neck CA ? ?Procedure:  ?PERCUTANEOUS GASTROSTOMY TUBE PLACEMENT ? ?Anesthesia: Conscious Sedation ?Complications: None ?Estimated Blood Loss: Minimal ? ?Findings:  ?Successful placement of a  30 F gastrostomy tube under fluoroscopy. ? ?Plan: ?G-tube to gravity drainage bag x 24 hrs ?Liquid diet x 24 hrs ?OK to cap G-tube (during 24 hr period listed above) for 2 hours post liquid meal. ?OK for meds per tube 6 hrs post procedure. ?OK to begin TFs and G-tube use in 24 hrs, taper from trickle to goal as tolerated. ? ?Pt is to return to VIR for routine G-tube exchange in 6 months. ? ?See detailed procedure note with images in PACS. ?The patient tolerated the procedure well without incident or complication and was returned to Recovery in stable condition.  ? ? ?Michaelle Birks, MD ?[facility] ?[facility] ? ? ?Pager. [phone] ?Clinic. [phone]  ?

## 2021-05-23 ENCOUNTER — Inpatient Hospital Stay: Payer: Medicare Other

## 2021-05-23 ENCOUNTER — Other Ambulatory Visit: Payer: Self-pay

## 2021-05-23 ENCOUNTER — Ambulatory Visit
Admission: RE | Admit: 2021-05-23 | Discharge: 2021-05-23 | Disposition: A | Discharge status: Home / Self Care | Payer: Medicare Other | Source: Ambulatory Visit | Attending: Radiation Oncology | Admitting: Radiation Oncology

## 2021-05-23 VITALS — BP 131/92 | HR 91 | Temp 96.1°F | Resp 16 | Wt 166.9 lb

## 2021-05-23 DIAGNOSIS — C12 Malignant neoplasm of pyriform sinus: Secondary | ICD-10-CM

## 2021-05-23 DIAGNOSIS — Z5111 Encounter for antineoplastic chemotherapy: Secondary | ICD-10-CM | POA: Diagnosis not present

## 2021-05-23 LAB — CBC WITH DIFFERENTIAL/PLATELET
Abs Immature Granulocytes: 0.01 10*3/uL (ref 0.00–0.07)
Basophils Absolute: 0 10*3/uL (ref 0.0–0.1)
Basophils Relative: 1 %
Eosinophils Absolute: 0 10*3/uL (ref 0.0–0.5)
Eosinophils Relative: 1 %
HCT: 34 % — ABNORMAL LOW (ref 39.0–52.0)
Hemoglobin: 12.4 g/dL — ABNORMAL LOW (ref 13.0–17.0)
Immature Granulocytes: 1 %
Lymphocytes Relative: 15 %
Lymphs Abs: 0.3 10*3/uL — ABNORMAL LOW (ref 0.7–4.0)
MCH: 30.6 pg (ref 26.0–34.0)
MCHC: 36.5 g/dL — ABNORMAL HIGH (ref 30.0–36.0)
MCV: 84 fL (ref 80.0–100.0)
Monocytes Absolute: 0.2 10*3/uL (ref 0.1–1.0)
Monocytes Relative: 12 %
Neutro Abs: 1.4 10*3/uL — ABNORMAL LOW (ref 1.7–7.7)
Neutrophils Relative %: 70 %
Platelets: 86 10*3/uL — ABNORMAL LOW (ref 150–400)
RBC: 4.05 MIL/uL — ABNORMAL LOW (ref 4.22–5.81)
RDW: 12.5 % (ref 11.5–15.5)
WBC: 2 10*3/uL — ABNORMAL LOW (ref 4.0–10.5)
nRBC: 0 % (ref 0.0–0.2)

## 2021-05-23 LAB — COMPREHENSIVE METABOLIC PANEL
ALT: 16 U/L (ref 0–44)
AST: 21 U/L (ref 15–41)
Albumin: 3.6 g/dL (ref 3.5–5.0)
Alkaline Phosphatase: 58 U/L (ref 38–126)
Anion gap: 8 (ref 5–15)
BUN: 27 mg/dL — ABNORMAL HIGH (ref 8–23)
CO2: 27 mmol/L (ref 22–32)
Calcium: 9 mg/dL (ref 8.9–10.3)
Chloride: 98 mmol/L (ref 98–111)
Creatinine, Ser: 1.09 mg/dL (ref 0.61–1.24)
GFR, Estimated: >60 mL/min (ref >60)
Glucose, Bld: 136 mg/dL — ABNORMAL HIGH (ref 70–99)
Potassium: 3.2 mmol/L — ABNORMAL LOW (ref 3.5–5.1)
Sodium: 133 mmol/L — ABNORMAL LOW (ref 135–145)
Total Bilirubin: 1 mg/dL (ref 0.3–1.2)
Total Protein: 6.7 g/dL (ref 6.5–8.1)

## 2021-05-23 LAB — PHOSPHORUS: Phosphorus: 2.9 mg/dL (ref 2.5–4.6)

## 2021-05-23 LAB — MAGNESIUM: Magnesium: 1.7 mg/dL (ref 1.7–2.4)

## 2021-05-23 MED ORDER — POTASSIUM CHLORIDE 20 MEQ/100ML IV SOLN
20.0000 meq | Freq: Once | INTRAVENOUS | Status: AC
  Administered 2021-05-23: 20 meq via INTRAVENOUS

## 2021-05-23 MED ORDER — PALONOSETRON HCL INJECTION 0.25 MG/5ML
0.2500 mg | Freq: Once | INTRAVENOUS | Status: AC
  Administered 2021-05-23: 0.25 mg via INTRAVENOUS
  Filled 2021-05-23: qty 5

## 2021-05-23 MED ORDER — SODIUM CHLORIDE 0.9 % IV SOLN
INTRAVENOUS | Status: DC
  Filled 2021-05-23 (×2): qty 250

## 2021-05-23 MED ORDER — SODIUM CHLORIDE 0.9 % IV SOLN
10.0000 mg | Freq: Once | INTRAVENOUS | Status: AC
  Administered 2021-05-23: 10 mg via INTRAVENOUS
  Filled 2021-05-23: qty 10

## 2021-05-23 MED ORDER — HEPARIN SOD (PORK) LOCK FLUSH 100 UNIT/ML IV SOLN
INTRAVENOUS | Status: AC
  Administered 2021-05-23: 500 [IU]
  Filled 2021-05-23: qty 5

## 2021-05-23 MED ORDER — HEPARIN SOD (PORK) LOCK FLUSH 100 UNIT/ML IV SOLN
500.0000 [IU] | Freq: Once | INTRAVENOUS | Status: AC | PRN
  Filled 2021-05-23: qty 5

## 2021-05-23 MED ORDER — POTASSIUM CHLORIDE IN NACL 20-0.9 MEQ/L-% IV SOLN
Freq: Once | INTRAVENOUS | Status: AC
  Filled 2021-05-23: qty 1000

## 2021-05-23 MED ORDER — SODIUM CHLORIDE 0.9 % IV SOLN
150.0000 mg | Freq: Once | INTRAVENOUS | Status: AC
  Administered 2021-05-23: 150 mg via INTRAVENOUS
  Filled 2021-05-23: qty 150

## 2021-05-23 MED ORDER — SODIUM CHLORIDE 0.9 % IV SOLN
Freq: Once | INTRAVENOUS | Status: AC
  Filled 2021-05-23: qty 250

## 2021-05-23 MED ORDER — MAGNESIUM SULFATE 2 GM/50ML IV SOLN
2.0000 g | Freq: Once | INTRAVENOUS | Status: AC
  Administered 2021-05-23: 2 g via INTRAVENOUS
  Filled 2021-05-23: qty 50

## 2021-05-23 MED ORDER — SODIUM CHLORIDE 0.9 % IV SOLN
40.0000 mg/m2 | Freq: Once | INTRAVENOUS | Status: AC
  Administered 2021-05-23: 82 mg via INTRAVENOUS
  Filled 2021-05-23: qty 82

## 2021-05-23 NOTE — Progress Notes (Signed)
Per Dr. Rogue Bussing, ok to proceed with cisplatin with platelets of 86,000 and ANC of 1.4 today. Also add another 20 mEq potassium to treatment. ? ? ?1215pm: Patient only able to void 100 mls after pre fluids. Per Dr. Rogue Bussing, proceed with pre-meds/Cisplatin and add on 1 liter of NS to treatment. ?

## 2021-05-23 NOTE — Patient Instructions (Signed)
[facility] AT [place]  Discharge Instructions: ?Thank you for choosing [facility] to provide your oncology and hematology care.  ?If you have a lab appointment with the [facility], please go directly to the [facility] and check in at the registration area. ? ?Wear comfortable clothing and clothing appropriate for easy access to any Portacath or PICC line.  ? ?We strive to give you quality time with your provider. You may need to reschedule your appointment if you arrive late (15 or more minutes).  Arriving late affects you and other patients whose appointments are after yours.  Also, if you miss three or more appointments without notifying the office, you may be dismissed from the clinic at the provider?s discretion.    ?  ?For prescription refill requests, have your pharmacy contact our office and allow 72 hours for refills to be completed.   ? ?Today you received the following chemotherapy and/or immunotherapy agents:cisplatin    ?  ?To help prevent nausea and vomiting after your treatment, we encourage you to take your nausea medication as directed. ? ?BELOW ARE SYMPTOMS THAT SHOULD BE REPORTED IMMEDIATELY: ?*FEVER GREATER THAN 100.4 F (38 ?C) OR HIGHER ?*CHILLS OR SWEATING ?*NAUSEA AND VOMITING THAT IS NOT CONTROLLED WITH YOUR NAUSEA MEDICATION ?*UNUSUAL SHORTNESS OF BREATH ?*UNUSUAL BRUISING OR BLEEDING ?*URINARY PROBLEMS (pain or burning when urinating, or frequent urination) ?*BOWEL PROBLEMS (unusual diarrhea, constipation, pain near the anus) ?TENDERNESS IN MOUTH AND THROAT WITH OR WITHOUT PRESENCE OF ULCERS (sore throat, sores in mouth, or a toothache) ?UNUSUAL RASH, SWELLING OR PAIN  ?UNUSUAL VAGINAL DISCHARGE OR ITCHING  ? ?Items with * indicate a potential emergency and should be followed up as soon as possible or go to the Emergency Department if any problems should occur. ? ?Please show the CHEMOTHERAPY ALERT CARD or IMMUNOTHERAPY ALERT CARD at check-in to  the Emergency Department and triage nurse. ? ?Should you have questions after your visit or need to cancel or reschedule your appointment, please contact [facility] [place] AT [place]  [phone] and follow the prompts.  Office hours are 8:00 a.m. to 4:30 p.m. Monday - Friday. Please note that voicemails left after 4:00 p.m. may not be returned until the following business day.  We are closed weekends and major holidays. You have access to a nurse at all times for urgent questions. Please call the main number to the clinic [phone] and follow the prompts. ? ?For any non-urgent questions, you may also contact your provider using MyChart. We now offer e-Visits for anyone 83 and older to request care online for non-urgent symptoms. For details visit [email or web address]. ?  ?Also download the MyChart app! Go to the app store, search "MyChart", open the app, select [facility] [place], and log in with your MyChart username and password. ? ?Due to Covid, a mask is required upon entering the hospital/clinic. If you do not have a mask, one will be given to you upon arrival. For doctor visits, patients may have 1 support person aged 89 or older with them. For treatment visits, patients cannot have anyone with them due to current Covid guidelines and our immunocompromised population.  ?

## 2021-05-24 ENCOUNTER — Other Ambulatory Visit: Payer: Self-pay | Admitting: Interventional Radiology

## 2021-05-24 ENCOUNTER — Ambulatory Visit
Admission: RE | Admit: 2021-05-24 | Discharge: 2021-05-24 | Disposition: A | Discharge status: Home / Self Care | Payer: Medicare Other | Source: Ambulatory Visit | Attending: Radiation Oncology | Admitting: Radiation Oncology

## 2021-05-24 ENCOUNTER — Inpatient Hospital Stay: Payer: Medicare Other

## 2021-05-24 DIAGNOSIS — Z09 Encounter for follow-up examination after completed treatment for conditions other than malignant neoplasm: Secondary | ICD-10-CM

## 2021-05-24 DIAGNOSIS — Z5111 Encounter for antineoplastic chemotherapy: Secondary | ICD-10-CM | POA: Diagnosis not present

## 2021-05-24 NOTE — Progress Notes (Signed)
Nutrition Follow-up: ? ?Patient with stage IV pyriform sinus cancer.  Patient receiving concurrent chemotherapy and radiation.  PEG placed on 3/10 (18 Fr).   ? ?Spoke with patient via phone.  Patient reports that he was able to give 2 cartons of isosource 1.5 on Saturday, 3 cartons on Sunday and 2 cartons on Monday (treatment day).  Says he has given 1 carton so far today.  Flushing with 171m water before and after each feeding.  Not taking much orally due to sore throat.  Denies nausea.  Days that he had loose stool either on Friday or Saturday, no bowel movement since.   ? ? ? ?Medications: reviewed ? ?Labs: Na 133, K 3.2, Phosphorus 2.9, Mag 1.7 on 3/13 ? ?Anthropometrics:  ? ?Weight 166 lb 14.2 oz on 3/13 ? ?171 lb on 3/6 ?176 lb 4.1 oz on 2/27 ?182 lb 9.6 oz on 2/13 ?186 lb 14.4 oz on 2/6 ?194 lb 3.2 oz on 1/27 ? ? ?Estimated Energy Needs ? ?Kcals: [address]?Protein: 115-135 g ?Fluid: 2300-2700 ml ? ?NUTRITION DIAGNOSIS: Inadequate oral intake continues ? ? ?MALNUTRITION DIAGNOSIS: Severe malnutrition continues ? ? ?INTERVENTION:  ?Recommend patient increase to 4 cartons tomorrow and 5 cartons on Thursday.  Goal rate is 7 cartons per day to better meet nutritional needs.  ?Continue 1278mwater flush before and after feeding ?Patient has received formula and supplies from [facility] home health RN came out yesterday.  Patient said that he did not feel like he needed home health nurse at this time.   ?Recommend checking refeeding labs again on 3/16.  Message sent to provider. ?Encouraged sips of liquids to continue using swallowing muscles and good oral care. ?Patient denies questions or concerns at this time ?  ? ?MONITORING, EVALUATION, GOAL: weight trends, tube feeding ? ? ?NEXT VISIT: Thursday, March 16 after radiation ? ?Sheniya Garciaperez B. AlZenia ResidesRD, LDN ?Registered Dietitian ?[phone]) ? ? ?

## 2021-05-25 ENCOUNTER — Ambulatory Visit
Admission: RE | Admit: 2021-05-25 | Discharge: 2021-05-25 | Disposition: A | Discharge status: Home / Self Care | Payer: Medicare Other | Source: Ambulatory Visit | Attending: Radiation Oncology | Admitting: Radiation Oncology

## 2021-05-25 ENCOUNTER — Other Ambulatory Visit: Payer: Self-pay

## 2021-05-25 DIAGNOSIS — Z5111 Encounter for antineoplastic chemotherapy: Secondary | ICD-10-CM | POA: Diagnosis not present

## 2021-05-25 DIAGNOSIS — C12 Malignant neoplasm of pyriform sinus: Secondary | ICD-10-CM

## 2021-05-26 ENCOUNTER — Other Ambulatory Visit: Payer: Self-pay

## 2021-05-26 ENCOUNTER — Ambulatory Visit
Admission: RE | Admit: 2021-05-26 | Discharge: 2021-05-26 | Disposition: A | Discharge status: Home / Self Care | Payer: Medicare Other | Source: Ambulatory Visit | Attending: Radiation Oncology | Admitting: Radiation Oncology

## 2021-05-26 ENCOUNTER — Inpatient Hospital Stay: Payer: Medicare Other

## 2021-05-26 DIAGNOSIS — Z5111 Encounter for antineoplastic chemotherapy: Secondary | ICD-10-CM | POA: Diagnosis not present

## 2021-05-26 LAB — COMPREHENSIVE METABOLIC PANEL
ALT: 13 U/L (ref 0–44)
AST: 21 U/L (ref 15–41)
Albumin: 3.7 g/dL (ref 3.5–5.0)
Alkaline Phosphatase: 60 U/L (ref 38–126)
Anion gap: 10 (ref 5–15)
BUN: 25 mg/dL — ABNORMAL HIGH (ref 8–23)
CO2: 27 mmol/L (ref 22–32)
Calcium: 9 mg/dL (ref 8.9–10.3)
Chloride: 97 mmol/L — ABNORMAL LOW (ref 98–111)
Creatinine, Ser: 1.12 mg/dL (ref 0.61–1.24)
GFR, Estimated: >60 mL/min (ref >60)
Glucose, Bld: 117 mg/dL — ABNORMAL HIGH (ref 70–99)
Potassium: 3.9 mmol/L (ref 3.5–5.1)
Sodium: 134 mmol/L — ABNORMAL LOW (ref 135–145)
Total Bilirubin: 0.6 mg/dL (ref 0.3–1.2)
Total Protein: 6.6 g/dL (ref 6.5–8.1)

## 2021-05-26 LAB — MAGNESIUM: Magnesium: 1.5 mg/dL — ABNORMAL LOW (ref 1.7–2.4)

## 2021-05-26 LAB — PHOSPHORUS: Phosphorus: 3.3 mg/dL (ref 2.5–4.6)

## 2021-05-26 NOTE — Progress Notes (Signed)
Nutrition Follow-up: ? ?Patient with stage IV pyriform sinus cancer.  Patient receiving concurrent chemotherapy and radiation.  PEG placed on 3/10 (18 Fr).   ? ?Spoke with patient following radiation.  Patient with increased hoarseness today.  Having pain on swallowing, thick mucus.  Patient not eating orally or drinking much due to pain.  Not taking pain medication. Says it does not help the pain.  Reports no nausea, no bowel movement in couple of days.  Says that he took 3 cartons of tube feeding yesterday without difficulty.   ? ? ? ?Medications: MMW and hycet ? ?Labs: Na 133, K 3.2, Mag WNL and Phosphorus WNL ? ?Anthropometrics:  ? ?No new weight ?166 lb on 3/13 ? ?171 lb on 3/6 ?176 lb on 2/27 ?182 lb on 2/13 ?186 lb on 2/6 ?194 lb on 1/27 ? ? ?Estimated Energy Needs ? ?Kcals: 3329-5188 ?Protein: 115-135 g ?Fluid: 2300-2700 ml ? ?NUTRITION DIAGNOSIS: Inadequate oral intake continues ? ?Severe malnutrition continues ? ?INTERVENTION:  ?Recommend patient take 4 cartons of tube feeding today.  Schedule written. ?Patient to give 5 cartons of isosource 1.5 tomorrow and will add 1 carton daily.  Daily schedule written for patient today. ?Flush with 120 ml before and after each feeding.  ?Refeeding labs to be checked again today. Walked patient to lab area.   ? ?  ? ?MONITORING, EVALUATION, GOAL: weight trends, intake, tube feeding ? ? ?NEXT VISIT: Thursday, March 23rd after radiation ? ?Alaiah Lundy [name], RD, LDN ?Registered Dietitian ?[phone] (mobile) ? ? ?

## 2021-05-27 ENCOUNTER — Ambulatory Visit: Payer: Medicare Other | Attending: Hospice and Palliative Medicine | Admitting: Speech Pathology

## 2021-05-27 ENCOUNTER — Ambulatory Visit
Admission: RE | Admit: 2021-05-27 | Discharge: 2021-05-27 | Disposition: A | Discharge status: Home / Self Care | Payer: Medicare Other | Source: Ambulatory Visit | Attending: Radiation Oncology | Admitting: Radiation Oncology

## 2021-05-27 DIAGNOSIS — C12 Malignant neoplasm of pyriform sinus: Secondary | ICD-10-CM | POA: Insufficient documentation

## 2021-05-27 DIAGNOSIS — Z5111 Encounter for antineoplastic chemotherapy: Secondary | ICD-10-CM | POA: Diagnosis not present

## 2021-05-27 DIAGNOSIS — R1313 Dysphagia, pharyngeal phase: Secondary | ICD-10-CM | POA: Diagnosis not present

## 2021-05-27 NOTE — Patient Instructions (Signed)
Oral care, swallow exercises  ?

## 2021-05-27 NOTE — Therapy (Signed)
[facility] ?[place], [place], [address] ?Phone: [phone]   Fax:  [phone] ? ?Speech Language Pathology Treatment ? ?Patient Details  ?Name: Travis Arellano ?MRN: [id number] ?Date of Birth: June 07, 1945 ?Referring Provider (SLP): Altha Harm ? ? ?Encounter Date: 05/27/2021 ? ? End of Session - 05/27/21 1617   ? ? Visit Number 2   ? Number of Visits 9   ? Date for SLP Re-Evaluation 06/26/21   ? Authorization Type [employer]; [employer]   ? Authorization Time Period 04/29/2021 thru 06/26/2021   ? Authorization - Visit Number 2   ? Progress Note Due on Visit 10   ? SLP Start Time [id number]   ? SLP Stop Time  0940   ? SLP Time Calculation (min) 30 min   ? Activity Tolerance Patient tolerated treatment well   ? ?  ?  ? ?  ? ? ?Past Medical History:  ?Diagnosis Date  ? 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 High cholesterol   ? Hypertension   ? Wears dentures   ? Full upper, partial lower  ? ? ?Past Surgical History:  ?Procedure Laterality Date  ? CATARACT EXTRACTION W/PHACO Right 05/17/2020  ? Procedure: CATARACT EXTRACTION PHACO AND INTRAOCULAR LENS PLACEMENT (IOC);  Surgeon: Baruch Goldmann, MD;  Location: [facility];  Service: Ophthalmology;  Laterality: Right;  CDE: 15.03  ? CATARACT EXTRACTION W/PHACO Left 06/11/2020  ? Procedure: CATARACT EXTRACTION PHACO AND INTRAOCULAR LENS PLACEMENT (IOC);  Surgeon: Baruch Goldmann, MD;  Location: [facility];  Service: Ophthalmology;  Laterality: Left;  CDE: 13.98  ? COLONOSCOPY    ? IR GASTROSTOMY TUBE MOD SED  05/20/2021  ? IR IMAGING GUIDED PORT INSERTION  04/05/2021  ? MICROLARYNGOSCOPY N/A 03/22/2021  ? Procedure: MICROSCOPIC DIRECT LARYNGOSCOPY WITH BIOPSY;  Surgeon: Clyde Canterbury, MD;  Location: [facility];  Service: ENT;  Laterality: N/A;  ? RIGID ESOPHAGOSCOPY N/A 03/22/2021  ? Procedure: RIGID ESOPHAGOSCOPY;  Surgeon: Clyde Canterbury, MD;  Location: [facility];  Service: ENT;  Laterality:  N/A;  ? ? ?There were no vitals filed for this visit. ? ? Subjective Assessment - 05/27/21 1615   ? ? Subjective pt pleasant "one week left of radiation"   ? Currently in Pain? Yes   ? ?  ?  ? ?  ? ? ? ? ? ? ? ? ADULT SLP TREATMENT - 05/27/21 0001   ? ?  ? Treatment Provided  ? Treatment provided Dysphagia   ? ?  ?  ? ?  ? ? ? SLP Education - 05/27/21 1616   ? ? Education Details effects of radiation on the pharyngeal muscles   ? Person(s) Educated Patient   ? Methods Explanation;Demonstration;Verbal cues;Handout   ? Comprehension Verbalized understanding;Need further instruction   ? ?  ?  ? ?  ? ? ? SLP Short Term Goals - 05/01/21 1056   ? ?  ? SLP SHORT TERM GOAL #1  ? Title Pt will demonstrate understanding of current medical condition by answering [facility] qeustions with > 90% accuracy.   ? Baseline new goal   ? Time 10   ? Period --   sessions  ? Status New   ?  ? SLP SHORT TERM GOAL #2  ? Title Pt will demonstrate understanding of s/s of aspiration by answering [facility] questions with > 90% accuracy.   ? Baseline new goal   ? Time 10   ? Period --  sessions  ? Status New   ? ?  ?  ? ?  ? ? ? SLP Long Term Goals - 05/01/21 1058   ? ?  ? SLP LONG TERM GOAL #1  ? Title Pt will reduce risk of aspiration by demonstrating understanding of aspiration precautions/diet modification.   ? Baseline new goal   ? Time 8   ? Period Weeks   ? Status New   ? Target Date 06/26/21   ? ?  ?  ? ?  ? ? ? Plan - 05/27/21 1617   ? ? Clinical Impression Statement Pt presents for continued education and swallow exercises for preservation of swallow function during and after radiation for cancer of the pyriform sinuses.   ? Speech Therapy Frequency 1x /week   ? Duration 8 weeks   ? Treatment/Interventions Aspiration precaution training;Pharyngeal strengthening exercises;SLP instruction and feedback;Patient/family education   ? Potential to Achieve Goals Good   ? SLP Home Exercise Plan provided, see pt instructions section   ? Consulted and Agree  with Plan of Care Patient   ? ?  ?  ? ?  ? ? ?Patient will benefit from skilled therapeutic intervention in order to improve the following deficits and impairments:   ?Dysphagia, pharyngeal phase ? 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 ? ? ?Problem List ?Patient Active Problem List  ? Diagnosis Date Noted  ? 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 ?Travis Arellano, M.S., CCC-SLP, CBIS ?Speech-Language Pathologist ?Rehabilitation Services ?Office [phone] ? ?Oluwatobi Ruppe, CCC-SLP ?05/27/2021, 4:19 PM ? ?[facility] ?[place], [place], [address] ?Phone: [phone]   Fax:  [phone] ? ? ?Name: Travis Arellano ?MRN: [id number] ?Date of Birth: 07/19/1945 ? ?

## 2021-05-29 ENCOUNTER — Other Ambulatory Visit: Payer: Self-pay | Admitting: Internal Medicine

## 2021-05-30 ENCOUNTER — Inpatient Hospital Stay (HOSPITAL_BASED_OUTPATIENT_CLINIC_OR_DEPARTMENT_OTHER): Payer: Medicare Other | Admitting: Nurse Practitioner

## 2021-05-30 ENCOUNTER — Inpatient Hospital Stay: Payer: Medicare Other

## 2021-05-30 ENCOUNTER — Ambulatory Visit: Payer: Medicare Other

## 2021-05-30 ENCOUNTER — Other Ambulatory Visit: Payer: Self-pay | Admitting: *Deleted

## 2021-05-30 ENCOUNTER — Other Ambulatory Visit: Payer: Self-pay

## 2021-05-30 ENCOUNTER — Encounter: Payer: Self-pay | Admitting: Nurse Practitioner

## 2021-05-30 ENCOUNTER — Ambulatory Visit: Admission: RE | Admit: 2021-05-30 | Payer: Medicare Other | Source: Ambulatory Visit

## 2021-05-30 VITALS — BP 132/83 | HR 93 | Temp 98.1°F | Resp 16 | Ht 70.0 in | Wt 166.0 lb

## 2021-05-30 DIAGNOSIS — Z931 Gastrostomy status: Secondary | ICD-10-CM

## 2021-05-30 DIAGNOSIS — K208 Other esophagitis without bleeding: Secondary | ICD-10-CM | POA: Diagnosis not present

## 2021-05-30 DIAGNOSIS — Z87891 Personal history of nicotine dependence: Secondary | ICD-10-CM

## 2021-05-30 DIAGNOSIS — C12 Malignant neoplasm of pyriform sinus: Secondary | ICD-10-CM

## 2021-05-30 DIAGNOSIS — D696 Thrombocytopenia, unspecified: Secondary | ICD-10-CM

## 2021-05-30 DIAGNOSIS — D649 Anemia, unspecified: Secondary | ICD-10-CM

## 2021-05-30 DIAGNOSIS — B370 Candidal stomatitis: Secondary | ICD-10-CM | POA: Diagnosis not present

## 2021-05-30 DIAGNOSIS — I10 Essential (primary) hypertension: Secondary | ICD-10-CM

## 2021-05-30 DIAGNOSIS — R7309 Other abnormal glucose: Secondary | ICD-10-CM

## 2021-05-30 DIAGNOSIS — Z801 Family history of malignant neoplasm of trachea, bronchus and lung: Secondary | ICD-10-CM

## 2021-05-30 DIAGNOSIS — R634 Abnormal weight loss: Secondary | ICD-10-CM

## 2021-05-30 DIAGNOSIS — Z5111 Encounter for antineoplastic chemotherapy: Secondary | ICD-10-CM | POA: Diagnosis not present

## 2021-05-30 DIAGNOSIS — R110 Nausea: Secondary | ICD-10-CM

## 2021-05-30 DIAGNOSIS — E876 Hypokalemia: Secondary | ICD-10-CM

## 2021-05-30 LAB — COMPREHENSIVE METABOLIC PANEL
ALT: 16 U/L (ref 0–44)
AST: 26 U/L (ref 15–41)
Albumin: 3.5 g/dL (ref 3.5–5.0)
Alkaline Phosphatase: 63 U/L (ref 38–126)
Anion gap: 9 (ref 5–15)
BUN: 31 mg/dL — ABNORMAL HIGH (ref 8–23)
CO2: 27 mmol/L (ref 22–32)
Calcium: 8.9 mg/dL (ref 8.9–10.3)
Chloride: 96 mmol/L — ABNORMAL LOW (ref 98–111)
Creatinine, Ser: 1.11 mg/dL (ref 0.61–1.24)
GFR, Estimated: >60 mL/min (ref >60)
Glucose, Bld: 162 mg/dL — ABNORMAL HIGH (ref 70–99)
Potassium: 3.9 mmol/L (ref 3.5–5.1)
Sodium: 132 mmol/L — ABNORMAL LOW (ref 135–145)
Total Bilirubin: 0.4 mg/dL (ref 0.3–1.2)
Total Protein: 6.6 g/dL (ref 6.5–8.1)

## 2021-05-30 LAB — CBC WITH DIFFERENTIAL/PLATELET
Abs Immature Granulocytes: 0.01 10*3/uL (ref 0.00–0.07)
Basophils Absolute: 0 10*3/uL (ref 0.0–0.1)
Basophils Relative: 0 %
Eosinophils Absolute: 0 10*3/uL (ref 0.0–0.5)
Eosinophils Relative: 0 %
HCT: 30.6 % — ABNORMAL LOW (ref 39.0–52.0)
Hemoglobin: 11 g/dL — ABNORMAL LOW (ref 13.0–17.0)
Immature Granulocytes: 0 %
Lymphocytes Relative: 13 %
Lymphs Abs: 0.3 10*3/uL — ABNORMAL LOW (ref 0.7–4.0)
MCH: 30.2 pg (ref 26.0–34.0)
MCHC: 35.9 g/dL (ref 30.0–36.0)
MCV: 84.1 fL (ref 80.0–100.0)
Monocytes Absolute: 0.4 10*3/uL (ref 0.1–1.0)
Monocytes Relative: 20 %
Neutro Abs: 1.5 10*3/uL — ABNORMAL LOW (ref 1.7–7.7)
Neutrophils Relative %: 67 %
Platelets: 120 10*3/uL — ABNORMAL LOW (ref 150–400)
RBC: 3.64 MIL/uL — ABNORMAL LOW (ref 4.22–5.81)
RDW: 12.9 % (ref 11.5–15.5)
WBC: 2.2 10*3/uL — ABNORMAL LOW (ref 4.0–10.5)
nRBC: 0 % (ref 0.0–0.2)

## 2021-05-30 LAB — MAGNESIUM: Magnesium: 1.6 mg/dL — ABNORMAL LOW (ref 1.7–2.4)

## 2021-05-30 LAB — PHOSPHORUS: Phosphorus: 3.2 mg/dL (ref 2.5–4.6)

## 2021-05-30 MED ORDER — DEXAMETHASONE SODIUM PHOSPHATE 10 MG/ML IJ SOLN
10.0000 mg | Freq: Once | INTRAMUSCULAR | Status: AC
  Administered 2021-05-30: 10 mg via INTRAVENOUS
  Filled 2021-05-30: qty 1

## 2021-05-30 MED ORDER — HEPARIN SOD (PORK) LOCK FLUSH 100 UNIT/ML IV SOLN
INTRAVENOUS | Status: AC
  Filled 2021-05-30: qty 5

## 2021-05-30 MED ORDER — MAGNESIUM SULFATE 2 GM/50ML IV SOLN
2.0000 g | Freq: Once | INTRAVENOUS | Status: AC
  Administered 2021-05-30: 2 g via INTRAVENOUS
  Filled 2021-05-30: qty 50

## 2021-05-30 MED ORDER — SODIUM CHLORIDE 0.9 % IV SOLN: 10.0000 mg | Freq: Once | INTRAVENOUS | Status: DC

## 2021-05-30 MED ORDER — SODIUM CHLORIDE 0.9 % IV SOLN
Freq: Once | INTRAVENOUS | Status: AC
  Filled 2021-05-30: qty 250

## 2021-05-30 MED ORDER — MORPHINE SULFATE (PF) 2 MG/ML IV SOLN
2.0000 mg | Freq: Once | INTRAVENOUS | Status: AC
  Administered 2021-05-30: 2 mg via INTRAVENOUS
  Filled 2021-05-30: qty 1

## 2021-05-30 MED ORDER — SODIUM CHLORIDE 0.9 % IV SOLN: Freq: Once | INTRAVENOUS | Status: DC

## 2021-05-30 MED ORDER — HEPARIN SOD (PORK) LOCK FLUSH 100 UNIT/ML IV SOLN
500.0000 [IU] | Freq: Once | INTRAVENOUS | Status: AC | PRN
  Administered 2021-05-30: 500 [IU]
  Filled 2021-05-30: qty 5

## 2021-05-30 MED ORDER — ONDANSETRON HCL 4 MG/2ML IJ SOLN
8.0000 mg | Freq: Once | INTRAMUSCULAR | Status: AC
  Administered 2021-05-30: 8 mg via INTRAVENOUS
  Filled 2021-05-30: qty 4

## 2021-05-30 MED ORDER — DEXAMETHASONE 4 MG PO TABS: 4.0000 mg | ORAL_TABLET | Freq: Two times a day (BID) | ORAL | 0 refills | Status: DC

## 2021-05-30 NOTE — Progress Notes (Signed)
Sore throat for several days. Having trouble swallowing anything.  ?

## 2021-05-30 NOTE — Assessment & Plan Note (Deleted)
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 The left level IIa lymph node has activity less than blood pool, although its partially cystic appearance was still unusual and suspicious.  ?? ?#Proceed with cisplatin weekly along with radiation [2/06-3/24].  Cycle #5 today.  Patient tolerating chemoradiation with expected side effects of weight loss sore throat; electrolyte imbalance see discussion below ?? ?# Electrolyte imbalance: ?hypokalemia- 3.2; add additional 20 of KCl. ?Hypogmag 1.6 proceed with mag infusion-as planned with premeds.   ?? 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 ?# PEG tube-  ?? ?# Risk of weight loss / difficulty swallowing-counseled the patient regarding potential weight loss; moderate to severe mucositis from chemoradiation.  Given the significant weight loss in the last few months/starting therapy discussed the importance of PEG tube placement.  Patient finally agrees. Recommend magic mouth wash; see above.  Continue close monitoring with nutrition.  Informed [place]. ?? ?# GBG- 139; [non-diabetic]; awaiting HbA1c-; monitor BG closely n chemo ?? ?# IV access/Mediport-s/p mediport- ?? ?# DISPOSITION: ?# chemo today; give 20 KCL.   ?# in 1 week- labs- cbc/cmp;mag; cisplatin; possible KCL 20 meq IV; possible 2 gm Mag ?# follow up in 2 weeks- MD: labs- cbc/cmp/mag;- cisplatin weekly; possible IV KCL 20 meq/2 gm Mag-Dr.B ?

## 2021-05-30 NOTE — Progress Notes (Signed)
[facility] ?CONSULT NOTE ? ?Patient Care Team: ?[facility] as PCP - General ?Cammie Sickle, MD as Consulting Physician (Oncology) ?Noreene Filbert, MD as Consulting Physician (Radiation Oncology) ?Clyde Canterbury, MD as Referring Physician (Otolaryngology) ? ?CHIEF COMPLAINTS/PURPOSE OF CONSULTATION: head and neck cancer ? ?#  ?Oncology History Overview Note  ?DIAGNOSIS:  ?A. PIRIFORM SINUS LESION, LEFT; BIOPSY:  ?- INVASIVE SQUAMOUS CELL CARCINOMA, KERATINIZING.  ? ?Asymmetric soft tissue thickening and enhancement of the left ?aryepiglottic fold and adjacent paraglottic fat suspicious for ?malignancy. Abnormal right level 1/2 and left level 2 likely ?cystic/necrotic nodes. ?  ?A subcentimeter low-density lesion of the right vallecula probably ?reflects a cyst but direct visual inspection is recommended. ? ?# JAN 2023-Pyriform sinus- SCC-[Dr.Bennett]clinically T1 [~1cm; N2a]; PET scan Jan 19th, 2023- The mucosal thickening along the left piriform sinus is highlyHypermetabolic'  The contralateral right level IIa lymph node is mildly hypermetabolic along its more solid-appearing anterior inferior border, maximum SUV 4.0. The left level IIa lymph node has activity less than blood pool, although its partially cystic appearance was still unusual and suspicious.  ? ?# FEB 6th, 2023- Cisplatin-RT ?  ? ?  ?  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  ?04/18/2021 -  Chemotherapy  ? Patient is on Treatment Plan : HEAD/NECK Cisplatin q7d  ?   ? Cancer Staging  ? Cancer Staging  ?No matching staging information was found for the patient. ?  ?04/18/2021 Cancer Staging  ? Staging form: Pharynx - Hypopharynx, AJCC 8th Edition ?- Clinical: Stage IVA (cT1, cN2c, cM0) - Signed by Cammie Sickle, MD on 04/18/2021 ?  ? ? ? ?HISTORY OF PRESENTING ILLNESS: Ambulating independently.  Alone. ?Travis Arellano 76 y.o.  male newly  diagnosed pyriform sinus cancer/locally advanced, s/p concurrent chemo-radiation, progression, now s/p 6 cycles of weekly cisplatin. In the interim, he has had peg tube placed. Only taking nutrition/feeds via tube and has not taken any medication by tube. Reports weakness, inability to swallow, spitting secretions. Unable to tolerate food, fluids, or medications. Was previously told he had oral yeast infection but was unable to swallow pills that he was given. Not using oral rinses either.  ? ?Review of Systems  ?Constitutional:  Positive for malaise/fatigue and weight loss. Negative for chills, diaphoresis and fever.  ?HENT:  Positive for sore throat. Negative for nosebleeds.   ?Eyes:  Negative for double vision.  ?Respiratory:  Negative for cough, hemoptysis, sputum production, shortness of breath and wheezing.   ?Cardiovascular:  Negative for chest pain, palpitations, orthopnea and leg swelling.  ?Gastrointestinal:  Negative for abdominal pain, blood in stool, constipation, diarrhea, heartburn, melena, nausea and vomiting.  ?Genitourinary:  Negative for dysuria, frequency and urgency.  ?Musculoskeletal:  Positive for back pain and joint pain.  ?Skin: Negative.  Negative for itching and rash.  ?Neurological:  Negative for dizziness, tingling, focal weakness, weakness and headaches.  ?Endo/Heme/Allergies:  Does not bruise/bleed easily.  ?Psychiatric/Behavioral:  Negative for depression. The patient is not nervous/anxious and does not have insomnia.    ? ?MEDICAL HISTORY:  ?Past Medical History:  ?Diagnosis Date  ? 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 High cholesterol   ? Hypertension   ? Wears dentures   ? Full upper, partial lower  ? ?SURGICAL HISTORY: ?Past Surgical History:  ?Procedure Laterality Date  ? CATARACT EXTRACTION W/PHACO Right 05/17/2020  ? Procedure: CATARACT EXTRACTION PHACO AND INTRAOCULAR  LENS PLACEMENT (IOC);  Surgeon: Baruch Goldmann, MD;  Location: [facility];  Service: Ophthalmology;  Laterality:  Right;  CDE: 15.03  ? CATARACT EXTRACTION W/PHACO Left 06/11/2020  ? Procedure: CATARACT EXTRACTION PHACO AND INTRAOCULAR LENS PLACEMENT (IOC);  Surgeon: Baruch Goldmann, MD;  Location: [facility];  Service: Ophthalmology;  Laterality: Left;  CDE: 13.98  ? COLONOSCOPY    ? IR GASTROSTOMY TUBE MOD SED  05/20/2021  ? IR IMAGING GUIDED PORT INSERTION  04/05/2021  ? MICROLARYNGOSCOPY N/A 03/22/2021  ? Procedure: MICROSCOPIC DIRECT LARYNGOSCOPY WITH BIOPSY;  Surgeon: Clyde Canterbury, MD;  Location: [facility];  Service: ENT;  Laterality: N/A;  ? RIGID ESOPHAGOSCOPY N/A 03/22/2021  ? Procedure: RIGID ESOPHAGOSCOPY;  Surgeon: Clyde Canterbury, MD;  Location: [facility];  Service: ENT;  Laterality: N/A;  ? ?SOCIAL HISTORY: ?Social History  ? ?Socioeconomic History  ? Marital status: Divorced  ?  Spouse name: Not on file  ? Number of children: Not on file  ? Years of education: Not on file  ? Highest education level: Not on file  ?Occupational History  ? Not on file  ?Tobacco Use  ? Smoking status: Former  ?  Types: Cigarettes  ?  Quit date: 1999  ?  Years since quitting: 24.2  ? Smokeless tobacco: Never  ?Vaping Use  ? Vaping Use: Never used  ?Substance and Sexual Activity  ? Alcohol use: No  ? Drug use: No  ? Sexual activity: Not on file  ?Other Topics Concern  ? Not on file  ?Social History Narrative  ? Lives between [place] [[address] [place] [address]]; with son and brother. Quit smoking in 1999 [prior all life]; quit alcohol 30 years- used to drink beer. Retd/ Part time- auto-mechanic work.   ? ?Social Determinants of Health  ? ?Financial Resource Strain: Not on file  ?Food Insecurity: Not on file  ?Transportation Needs: Not on file  ?Physical Activity: Not on file  ?Stress: Not on file  ?Social Connections: Not on file  ?Intimate Partner Violence: Not on file  ? ? ?FAMILY HISTORY: ?Family History  ?Problem Relation Age of Onset  ? Congestive Heart Failure Mother   ? Diabetes Father   ? Lung cancer Brother    ? ? ?ALLERGIES:  has No Known Allergies. ? ?MEDICATIONS:  ?Current Outpatient Medications  ?Medication Sig Dispense Refill  ? lidocaine-prilocaine (EMLA) cream Apply 1 application topically as needed. Apply to port and cover with saran wrap 1-2 hours prior to port access 30 g 1  ? magic mouthwash (nystatin, lidocaine, diphenhydrAMINE, alum & mag hydroxide) suspension Swish and swallow 5 mLs 4 (four) times daily as needed for mouth pain. 180 mL 1  ? Nutritional Supplements (ISOSOURCE 1.5 CAL) LIQD Give 2 cartons at 8am. Give 1 carton at 11am, 2pm, 5pm and 8pm. Flush with 120 ml of water before and after each feeding. Send bolus tube feeding supplies.  [facility] RN starting on 05/23/21 to provide support for TF management in the home.  Start with 2 cartons daily and then add 1 carton each day until at goal rate. 1659 mL 0  ? dexamethasone (DECADRON) 4 MG tablet Take 1 tablet (4 mg total) by mouth 2 (two) times daily. (Patient not taking: Reported on 05/30/2021) 30 tablet 0  ? doxazosin (CARDURA) 4 MG tablet Take 4 mg by mouth daily. (Patient not taking: Reported on 05/30/2021)    ? fluconazole (DIFLUCAN) 100 MG tablet Take 1 tablet (100 mg total) by mouth daily. (Patient not  taking: Reported on 05/30/2021) 14 tablet 0  ? hydrochlorothiazide (MICROZIDE) 12.5 MG capsule Take 12.5 mg by mouth daily. (Patient not taking: Reported on 05/30/2021)    ? HYDROcodone-acetaminophen (HYCET) 7.5-325 mg/15 ml solution 10-15 cc PO every 4-6 hours as needed for pain (Patient not taking: Reported on 05/16/2021) 300 mL 0  ? HYDROcodone-acetaminophen (NORCO) 5-325 MG tablet Take 1 tablet by mouth every 6 (six) hours as needed for up to 15 doses for moderate pain. (Patient not taking: Reported on 05/30/2021) 15 tablet 0  ? lisinopril (PRINIVIL,ZESTRIL) 20 MG tablet Take 20 mg by mouth at bedtime. (Patient not taking: Reported on 05/20/2021)    ? metoprolol succinate (TOPROL-XL) 100 MG 24 hr tablet Take 100 mg by mouth daily. (Patient not taking:  Reported on 05/20/2021)    ? omeprazole (PRILOSEC) 20 MG capsule Take 1 capsule (20 mg total) by mouth daily. (Patient not taking: Reported on 05/30/2021) 30 capsule 2  ? ondansetron (ZOFRAN) 8 MG tablet Take 1 tablet (8 mg tot

## 2021-05-30 NOTE — Patient Instructions (Signed)
[facility]  Discharge Instructions: ?Thank you for choosing [facility] to provide your oncology and hematology care.  ?If you have a lab appointment with the Cancer Center, please go directly to the Cancer Center and check in at the registration area. ? ?Wear comfortable clothing and clothing appropriate for easy access to any Portacath or PICC line.  ? ?We strive to give you quality time with your provider. You may need to reschedule your appointment if you arrive late (15 or more minutes).  Arriving late affects you and other patients whose appointments are after yours.  Also, if you miss three or more appointments without notifying the office, you may be dismissed from the clinic at the provider?s discretion.    ?  ?For prescription refill requests, have your pharmacy contact our office and allow 72 hours for refills to be completed.   ? ?  ?To help prevent nausea and vomiting after your treatment, we encourage you to take your nausea medication as directed. ? ?BELOW ARE SYMPTOMS THAT SHOULD BE REPORTED IMMEDIATELY: ?*FEVER GREATER THAN 100.4 F (38 ?C) OR HIGHER ?*CHILLS OR SWEATING ?*NAUSEA AND VOMITING THAT IS NOT CONTROLLED WITH YOUR NAUSEA MEDICATION ?*UNUSUAL SHORTNESS OF BREATH ?*UNUSUAL BRUISING OR BLEEDING ?*URINARY PROBLEMS (pain or burning when urinating, or frequent urination) ?*BOWEL PROBLEMS (unusual diarrhea, constipation, pain near the anus) ?TENDERNESS IN MOUTH AND THROAT WITH OR WITHOUT PRESENCE OF ULCERS (sore throat, sores in mouth, or a toothache) ?UNUSUAL RASH, SWELLING OR PAIN  ?UNUSUAL VAGINAL DISCHARGE OR ITCHING  ? ?Items with * indicate a potential emergency and should be followed up as soon as possible or go to the Emergency Department if any problems should occur. ? ?Please show the CHEMOTHERAPY ALERT CARD or IMMUNOTHERAPY ALERT CARD at check-in to the Emergency Department and triage nurse. ? ?Should you have questions after your visit  or need to cancel or reschedule your appointment, please contact [facility]  [phone] and follow the prompts.  Office hours are 8:00 a.m. to 4:30 p.m. Monday - Friday. Please note that voicemails left after 4:00 p.m. may not be returned until the following business day.  We are closed weekends and major holidays. You have access to a nurse at all times for urgent questions. Please call the main number to the clinic [phone] and follow the prompts. ? ?For any non-urgent questions, you may also contact your provider using MyChart. We now offer e-Visits for anyone 18 and older to request care online for non-urgent symptoms. For details visit [email or web address]. ?  ?Also download the MyChart app! Go to the app store, search "MyChart", open the app, select [facility], and log in with your MyChart username and password. ? ?Due to Covid, a mask is required upon entering the hospital/clinic. If you do not have a mask, one will be given to you upon arrival. For doctor visits, patients may have 1 support person aged 18 or older with them. For treatment visits, patients cannot have anyone with them due to current Covid guidelines and our immunocompromised population.  ?

## 2021-05-30 NOTE — Progress Notes (Signed)
AVS given to patient with education and instructions on using PEG for medications. Reinforced teaching. Pt verbalized understanding and will return later in the week for follow up with [facility]. ? ?'2mg'$  Morphine given at 1100 for 8/10 throat pain.  ? ?1200, pt reports no relief, additional '2mg'$  Morphine ordered per Beckey Rutter NP. Given at 1205. Will continue to monitor. ? ?Discharged home at 1307. Patient states pain still not much better. Pt expressed understanding of the importance of starting back on Diflucan is the best option for improving throat pain ?

## 2021-05-30 NOTE — Progress Notes (Signed)
Pt education provided for administering medications via g-tube. Pt provided with irrigation kit and demonstration provided how to crush medications, administer and flush tube after each medication. Pt asked questions and verbalized understanding and this time. Unable to provide patient with pill crusher at this time but pt is aware that one is available OTC at any pharmacy. Pt will need follow up teaching as well.  ?

## 2021-05-31 ENCOUNTER — Other Ambulatory Visit: Payer: Self-pay | Admitting: Nurse Practitioner

## 2021-05-31 ENCOUNTER — Ambulatory Visit: Payer: Medicare Other

## 2021-06-01 ENCOUNTER — Ambulatory Visit: Payer: Medicare Other

## 2021-06-02 ENCOUNTER — Ambulatory Visit: Payer: Medicare Other

## 2021-06-02 ENCOUNTER — Telehealth: Payer: Self-pay

## 2021-06-02 ENCOUNTER — Inpatient Hospital Stay: Payer: Medicare Other

## 2021-06-02 NOTE — Telephone Encounter (Signed)
Nutrition ? ?Received message that patient called radiation to let them know that he would not be coming to treatment today.   ? ?RD planning to see patient following radiation appointment.   ?RD called patient for follow-up.  No answer.  Left message with patient and provided call back number.  ? ?Travis Arellano, RD, LDN ?Registered Dietitian ?[phone] ? ?

## 2021-06-03 ENCOUNTER — Inpatient Hospital Stay (HOSPITAL_BASED_OUTPATIENT_CLINIC_OR_DEPARTMENT_OTHER): Payer: Medicare Other | Admitting: Hospice and Palliative Medicine

## 2021-06-03 ENCOUNTER — Inpatient Hospital Stay: Payer: Medicare Other

## 2021-06-03 ENCOUNTER — Other Ambulatory Visit: Payer: Self-pay

## 2021-06-03 ENCOUNTER — Encounter: Payer: Self-pay | Admitting: Hospice and Palliative Medicine

## 2021-06-03 ENCOUNTER — Ambulatory Visit: Payer: Medicare Other

## 2021-06-03 VITALS — BP 138/91 | HR 86 | Temp 98.7°F | Resp 20 | Wt 163.6 lb

## 2021-06-03 DIAGNOSIS — C12 Malignant neoplasm of pyriform sinus: Secondary | ICD-10-CM

## 2021-06-03 DIAGNOSIS — Z5111 Encounter for antineoplastic chemotherapy: Secondary | ICD-10-CM | POA: Diagnosis not present

## 2021-06-03 DIAGNOSIS — B370 Candidal stomatitis: Secondary | ICD-10-CM

## 2021-06-03 DIAGNOSIS — E860 Dehydration: Secondary | ICD-10-CM

## 2021-06-03 DIAGNOSIS — Z95828 Presence of other vascular implants and grafts: Secondary | ICD-10-CM

## 2021-06-03 LAB — CBC WITH DIFFERENTIAL/PLATELET
Abs Immature Granulocytes: 0.02 10*3/uL (ref 0.00–0.07)
Basophils Absolute: 0 10*3/uL (ref 0.0–0.1)
Basophils Relative: 0 %
Eosinophils Absolute: 0 10*3/uL (ref 0.0–0.5)
Eosinophils Relative: 0 %
HCT: 28.1 % — ABNORMAL LOW (ref 39.0–52.0)
Hemoglobin: 10 g/dL — ABNORMAL LOW (ref 13.0–17.0)
Immature Granulocytes: 1 %
Lymphocytes Relative: 12 %
Lymphs Abs: 0.4 10*3/uL — ABNORMAL LOW (ref 0.7–4.0)
MCH: 30.6 pg (ref 26.0–34.0)
MCHC: 35.6 g/dL (ref 30.0–36.0)
MCV: 85.9 fL (ref 80.0–100.0)
Monocytes Absolute: 0.6 10*3/uL (ref 0.1–1.0)
Monocytes Relative: 19 %
Neutro Abs: 2 10*3/uL (ref 1.7–7.7)
Neutrophils Relative %: 68 %
Platelets: 140 10*3/uL — ABNORMAL LOW (ref 150–400)
RBC: 3.27 MIL/uL — ABNORMAL LOW (ref 4.22–5.81)
RDW: 14.4 % (ref 11.5–15.5)
WBC: 3 10*3/uL — ABNORMAL LOW (ref 4.0–10.5)
nRBC: 0 % (ref 0.0–0.2)

## 2021-06-03 LAB — MAGNESIUM: Magnesium: 1.8 mg/dL (ref 1.7–2.4)

## 2021-06-03 LAB — COMPREHENSIVE METABOLIC PANEL
ALT: 25 U/L (ref 0–44)
AST: 25 U/L (ref 15–41)
Albumin: 3.3 g/dL — ABNORMAL LOW (ref 3.5–5.0)
Alkaline Phosphatase: 54 U/L (ref 38–126)
Anion gap: 8 (ref 5–15)
BUN: 36 mg/dL — ABNORMAL HIGH (ref 8–23)
CO2: 28 mmol/L (ref 22–32)
Calcium: 9.3 mg/dL (ref 8.9–10.3)
Chloride: 100 mmol/L (ref 98–111)
Creatinine, Ser: 1.08 mg/dL (ref 0.61–1.24)
GFR, Estimated: >60 mL/min (ref >60)
Glucose, Bld: 111 mg/dL — ABNORMAL HIGH (ref 70–99)
Potassium: 3.4 mmol/L — ABNORMAL LOW (ref 3.5–5.1)
Sodium: 136 mmol/L (ref 135–145)
Total Bilirubin: 0.7 mg/dL (ref 0.3–1.2)
Total Protein: 6.7 g/dL (ref 6.5–8.1)

## 2021-06-03 MED ORDER — POTASSIUM CHLORIDE 20 MEQ/15ML (10%) PO SOLN: 20.0000 meq | Freq: Every day | ORAL | 0 refills | Status: DC

## 2021-06-03 MED ORDER — HEPARIN SOD (PORK) LOCK FLUSH 100 UNIT/ML IV SOLN
500.0000 [IU] | Freq: Once | INTRAVENOUS | Status: AC
  Administered 2021-06-03: 500 [IU] via INTRAVENOUS
  Filled 2021-06-03: qty 5

## 2021-06-03 MED ORDER — SODIUM CHLORIDE 0.9 % IV SOLN
INTRAVENOUS | Status: AC
  Filled 2021-06-03: qty 250

## 2021-06-03 MED ORDER — SODIUM CHLORIDE 0.9% FLUSH
10.0000 mL | Freq: Once | INTRAVENOUS | Status: DC
  Filled 2021-06-03: qty 10

## 2021-06-03 NOTE — Progress Notes (Signed)
? ?Symptom Management Clinic ?[place] at [facility] ?Telephone:(336) [phone] Fax:([phone] ? ?Patient Care Team: ?[facility] as PCP - General ?Cammie Sickle, MD as Consulting Physician (Oncology) ?Noreene Filbert, MD as Consulting Physician (Radiation Oncology) ?Clyde Canterbury, MD as Referring Physician (Otolaryngology)  ? ?Name of the patient: Travis Arellano  ?[id number]  ?Aug 02, 1945  ? ?Date of visit: 06/03/21 ? ?Reason for Consult: ?Travis Arellano is a 76 y.o. 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   ? ?Patient was seen by Beckey Rutter, NP on 05/30/2021 who held treatment as patient was symptomatic with mucositis and overall did not feel well.  He had recent PEG placed but was not taking his medications due to an inability to swallow and he did not have a pill crusher. ? ?Today, patient says that he is feeling about the same.  He denies significant changes or worsening of symptoms.  He denies fever or chills.  He says that he is having phlegm, particularly at night, which he finds difficult to expectorate.  No shortness of breath at present.  He is using his feeding tube now for both tube feeds and medications.  However, he is uncertain what medications he is taking. ? ?Denies any neurologic complaints. Denies recent fevers or illnesses. Denies any easy bleeding or bruising. Reports fair appetite and denies weight loss. Denies chest pain. Denies any nausea, vomiting, constipation, or diarrhea. Denies urinary complaints. Patient offers no further specific complaints today. ? ?PAST MEDICAL HISTORY: ?Past Medical History:  ?Diagnosis Date  ? Cancer of pyriform sinus ([place])   ? High cholesterol   ? Hypertension   ? Wears dentures   ? Full upper, partial lower  ? ? ?PAST SURGICAL HISTORY:  ?Past Surgical History:  ?Procedure Laterality Date  ? CATARACT EXTRACTION W/PHACO  Right 05/17/2020  ? Procedure: CATARACT EXTRACTION PHACO AND INTRAOCULAR LENS PLACEMENT (IOC);  Surgeon: Baruch Goldmann, MD;  [facility]: [facility];  Service: Ophthalmology;  Laterality: Right;  CDE: 15.03  ? CATARACT EXTRACTION W/PHACO Left 06/11/2020  ? Procedure: CATARACT EXTRACTION PHACO AND INTRAOCULAR LENS PLACEMENT (IOC);  Surgeon: Baruch Goldmann, MD;  Location: [facility];  Service: Ophthalmology;  Laterality: Left;  CDE: 13.98  ? COLONOSCOPY    ? IR GASTROSTOMY TUBE MOD SED  05/20/2021  ? IR IMAGING GUIDED PORT INSERTION  04/05/2021  ? MICROLARYNGOSCOPY N/A 03/22/2021  ? Procedure: MICROSCOPIC DIRECT LARYNGOSCOPY WITH BIOPSY;  Surgeon: Clyde Canterbury, MD;  Location: [facility];  Service: ENT;  Laterality: N/A;  ? RIGID ESOPHAGOSCOPY N/A 03/22/2021  ? Procedure: RIGID ESOPHAGOSCOPY;  Surgeon: Clyde Canterbury, MD;  Location: [facility];  Service: ENT;  Laterality: N/A;  ? ? ?HEMATOLOGY/ONCOLOGY HISTORY:  ?Oncology History Overview Note  ?DIAGNOSIS:  ?A. PIRIFORM SINUS LESION, LEFT; BIOPSY:  ?- INVASIVE SQUAMOUS CELL CARCINOMA, KERATINIZING.  ? ?Asymmetric soft tissue thickening and enhancement of the left ?aryepiglottic fold and adjacent paraglottic fat suspicious for ?malignancy. Abnormal right level 1/2 and left level 2 likely ?cystic/necrotic nodes. ?  ?A subcentimeter low-density lesion of the right vallecula probably ?reflects a cyst but direct visual inspection is recommended. ? ?# JAN 2023-Pyriform sinus- SCC-[Dr.Bennett]clinically T1 [~1cm; N2a]; PET scan Jan 19th, 2023- The mucosal thickening along the left piriform sinus is highlyHypermetabolic'  The contralateral right level IIa lymph node is mildly hypermetabolic along its more solid-appearing anterior inferior border, maximum SUV 4.0. The left level IIa lymph node  has activity less than blood pool, although its partially cystic appearance was still unusual and suspicious.  ? ?# FEB 6th, 2023- Cisplatin-RT ?  ? ?  ?  ?Cancer of pyriform sinus ([place])   ?03/30/2021 Initial Diagnosis  ? Cancer of pyriform sinus ([place]) ?  ?04/18/2021 -  Chemotherapy  ? Patient is on Treatment Plan : HEAD/NECK Cisplatin q7d  ?   ? Cancer Staging  ? Cancer Staging  ?No matching staging information was found for the patient. ?  ?04/18/2021 Cancer Staging  ? Staging form: Pharynx - Hypopharynx, AJCC 8th Edition ?- Clinical: Stage IVA (cT1, cN2c, cM0) - Signed by Cammie Sickle, MD on 04/18/2021 ?  ? ? ?ALLERGIES:  has No Known Allergies. ? ?MEDICATIONS:  ?Current Outpatient Medications  ?Medication Sig Dispense Refill  ? magic mouthwash (nystatin, lidocaine, diphenhydrAMINE, alum & mag hydroxide) suspension Swish and swallow 5 mLs 4 (four) times daily as needed for mouth pain. 180 mL 1  ? Nutritional Supplements (ISOSOURCE 1.5 CAL) LIQD Give 2 cartons at 8am. Give 1 carton at 11am, 2pm, 5pm and 8pm. Flush with 120 ml of water before and after each feeding. Send bolus tube feeding supplies.  [place] RN starting on 05/23/21 to provide support for TF management in the home.  Start with 2 cartons daily and then add 1 carton each day until at goal rate. 1659 mL 0  ? dexamethasone (DECADRON) 4 MG tablet Take 1 tablet (4 mg total) by mouth 2 (two) times daily. (Patient not taking: Reported on 05/30/2021) 30 tablet 0  ? doxazosin (CARDURA) 4 MG tablet Take 4 mg by mouth daily. (Patient not taking: Reported on 05/30/2021)    ? fluconazole (DIFLUCAN) 100 MG tablet Take 1 tablet (100 mg total) by mouth daily. (Patient not taking: Reported on 05/30/2021) 14 tablet 0  ? hydrochlorothiazide (MICROZIDE) 12.5 MG capsule Take 12.5 mg by mouth daily. (Patient not taking: Reported on 05/30/2021)    ? HYDROcodone-acetaminophen (HYCET) 7.5-325 mg/15 ml solution 10-15 cc PO every 4-6 hours as needed for pain (Patient not taking: Reported on 05/16/2021) 300 mL 0  ? HYDROcodone-acetaminophen (NORCO) 5-325 MG tablet Take 1 tablet by mouth every 6 (six) hours as needed for up to 15 doses for moderate pain. (Patient not  taking: Reported on 05/30/2021) 15 tablet 0  ? lidocaine-prilocaine (EMLA) cream Apply 1 application topically as needed. Apply to port and cover with saran wrap 1-2 hours prior to port access (Patient not taking: Reported on 06/03/2021) 30 g 1  ? lisinopril (PRINIVIL,ZESTRIL) 20 MG tablet Take 20 mg by mouth at bedtime. (Patient not taking: Reported on 05/20/2021)    ? metoprolol succinate (TOPROL-XL) 100 MG 24 hr tablet Take 100 mg by mouth daily. (Patient not taking: Reported on 05/20/2021)    ? omeprazole (PRILOSEC) 20 MG capsule Take 1 capsule (20 mg total) by mouth daily. (Patient not taking: Reported on 05/30/2021) 30 capsule 2  ? ondansetron (ZOFRAN) 8 MG tablet Take 1 tablet (8 mg total) by mouth every 8 (eight) hours as needed for nausea or vomiting (start 3 days; after chemo). (Patient not taking: Reported on 05/30/2021) 40 tablet 1  ? pravastatin (PRAVACHOL) 40 MG tablet Take 40 mg by mouth at bedtime. (Patient not taking: Reported on 05/30/2021)    ? prochlorperazine (COMPAZINE) 10 MG tablet Take 1 tablet (10 mg total) by mouth every 6 (six) hours as needed for nausea or vomiting. (Patient not taking: Reported on 05/30/2021) 40 tablet 1  ? sucralfate (  CARAFATE) 1 GM/10ML suspension Take 10 mLs (1 g total) by mouth 4 (four) times daily -  with meals and at bedtime. (Patient not taking: Reported on 05/30/2021) 420 mL 0  ? ?No current facility-administered medications for this visit.  ? ?Facility-Administered Medications Ordered in Other Visits  ?Medication Dose Route Frequency Provider Last Rate Last Admin  ? 0.9 %  sodium chloride infusion   Intravenous Continuous Talli Kimmer, Kirt Boys, NP      ? heparin lock flush 100 unit/mL  500 Units Intravenous Once Larkyn Greenberger, Vonna Kotyk R, NP      ? sodium chloride flush (NS) 0.9 % injection 10 mL  10 mL Intravenous Once Atalie Oros, Kirt Boys, NP      ? ? ?VITAL SIGNS: ?BP (!) 138/91   Pulse 86   Temp 98.7 ?F (37.1 ?C)   Resp 20   Wt 163 lb 9.6 oz (74.2 kg)   SpO2 100%   BMI 23.47  kg/m?  ?Filed Weights  ? 06/03/21 0848  ?Weight: 163 lb 9.6 oz (74.2 kg)  ?  ?Estimated body mass index is 23.47 kg/m? as calculated from the following: ?  Height as of 05/30/21: '5\' 10"'$  (1.778 m). ?  Weight as of

## 2021-06-03 NOTE — Progress Notes (Signed)
Patient states he has a really bad cough with phlegm. Patient states he had blood around his feeding tube this morning and he thought it was because he has been doing a lot of coughing. This has happened before and he just changes the bandage around it. Patient also states that these coughing spells happens a lot at night. This causes him to not be able to sleep flat at night. Patient states his throat is really sore. Patient also states that he has coughed up bloody phlegm once or twice before.  ?

## 2021-06-03 NOTE — Progress Notes (Signed)
Nutrition Follow-up: ? ?Planning to see patient in fluid clinic but patient finished before RD able to see patient.  ? ?Called patient for nutrition follow-up.  Patient says that he is giving 4 cartons of isosource 1.5.  Flushing with 120 ml of water before and after each feeding.  Says that he is having some nausea and taking medications.  Reports having bowel movement after taking something to help him. Does not know what he took.   ? ?Medications: reviewed ? ?Labs: Na 136, K 3.4, BUN 36, creatinine 1.08, Mag 1.8 ? ?Anthropometrics:  ? ?Weight 163 lb today ? ?166 lb on 3/13 ?171 lb on 3/6 ?176 lb on 2/27 ?182 lb on 2/13 ?186 lb on 2/6 ?194 lb on 1/27 ? ? ?Estimated Energy Needs ? ?Kcals: 7939-0300 ?Protein: 115-135 g ?Fluid: 2300-2774m ? ?NUTRITION DIAGNOSIS: Inadequate oral intake continues ? ? ?INTERVENTION:  ?Patient says he did not know he could take 2 cartons at one feeding.  RD provided written instructions on titration on last visit that included 2 cartons at a feeding.  RD again reviewed feeding schedule with patient over the phone.  Patient to try 2 cartons at first feeding and second feeding, then provide 1 carton at the remaining 2 feedings.  Flush with 120 ml of water before and after each feeding.  ?  ? ?MONITORING, EVALUATION, GOAL: weight trends, tube feeding ? ? ?NEXT VISIT: Tuesday, April 4th phone call ? ?Anieya Helman B. AZenia Resides RD, LDN ?Registered Dietitian ?[phone]? ? ?

## 2021-06-05 ENCOUNTER — Encounter: Payer: Self-pay | Admitting: Internal Medicine

## 2021-06-05 DIAGNOSIS — L989 Disorder of the skin and subcutaneous tissue, unspecified: Secondary | ICD-10-CM | POA: Insufficient documentation

## 2021-06-05 DIAGNOSIS — L209 Atopic dermatitis, unspecified: Secondary | ICD-10-CM | POA: Insufficient documentation

## 2021-06-05 DIAGNOSIS — D126 Benign neoplasm of colon, unspecified: Secondary | ICD-10-CM | POA: Insufficient documentation

## 2021-06-05 DIAGNOSIS — E782 Mixed hyperlipidemia: Secondary | ICD-10-CM | POA: Insufficient documentation

## 2021-06-05 DIAGNOSIS — R739 Hyperglycemia, unspecified: Secondary | ICD-10-CM | POA: Insufficient documentation

## 2021-06-05 DIAGNOSIS — N400 Enlarged prostate without lower urinary tract symptoms: Secondary | ICD-10-CM | POA: Insufficient documentation

## 2021-06-06 ENCOUNTER — Encounter: Payer: Self-pay | Admitting: Nurse Practitioner

## 2021-06-06 ENCOUNTER — Inpatient Hospital Stay (HOSPITAL_BASED_OUTPATIENT_CLINIC_OR_DEPARTMENT_OTHER): Payer: Medicare Other | Admitting: Nurse Practitioner

## 2021-06-06 ENCOUNTER — Inpatient Hospital Stay: Payer: Medicare Other

## 2021-06-06 ENCOUNTER — Ambulatory Visit
Admission: RE | Admit: 2021-06-06 | Discharge: 2021-06-06 | Disposition: A | Discharge status: Home / Self Care | Payer: Medicare Other | Source: Ambulatory Visit | Attending: Radiation Oncology | Admitting: Radiation Oncology

## 2021-06-06 ENCOUNTER — Other Ambulatory Visit: Payer: Self-pay

## 2021-06-06 VITALS — BP 108/75 | HR 95 | Temp 97.9°F | Ht 70.0 in | Wt 163.4 lb

## 2021-06-06 DIAGNOSIS — D709 Neutropenia, unspecified: Secondary | ICD-10-CM

## 2021-06-06 DIAGNOSIS — B370 Candidal stomatitis: Secondary | ICD-10-CM | POA: Diagnosis not present

## 2021-06-06 DIAGNOSIS — R634 Abnormal weight loss: Secondary | ICD-10-CM

## 2021-06-06 DIAGNOSIS — N289 Disorder of kidney and ureter, unspecified: Secondary | ICD-10-CM

## 2021-06-06 DIAGNOSIS — T451X5A Adverse effect of antineoplastic and immunosuppressive drugs, initial encounter: Secondary | ICD-10-CM

## 2021-06-06 DIAGNOSIS — Z5111 Encounter for antineoplastic chemotherapy: Secondary | ICD-10-CM | POA: Diagnosis not present

## 2021-06-06 DIAGNOSIS — T66XXXA Radiation sickness, unspecified, initial encounter: Secondary | ICD-10-CM

## 2021-06-06 DIAGNOSIS — E876 Hypokalemia: Secondary | ICD-10-CM

## 2021-06-06 DIAGNOSIS — K208 Other esophagitis without bleeding: Secondary | ICD-10-CM

## 2021-06-06 DIAGNOSIS — Z931 Gastrostomy status: Secondary | ICD-10-CM

## 2021-06-06 DIAGNOSIS — I10 Essential (primary) hypertension: Secondary | ICD-10-CM

## 2021-06-06 DIAGNOSIS — C12 Malignant neoplasm of pyriform sinus: Secondary | ICD-10-CM

## 2021-06-06 DIAGNOSIS — R739 Hyperglycemia, unspecified: Secondary | ICD-10-CM

## 2021-06-06 DIAGNOSIS — D649 Anemia, unspecified: Secondary | ICD-10-CM

## 2021-06-06 DIAGNOSIS — Z801 Family history of malignant neoplasm of trachea, bronchus and lung: Secondary | ICD-10-CM

## 2021-06-06 DIAGNOSIS — Z87891 Personal history of nicotine dependence: Secondary | ICD-10-CM

## 2021-06-06 DIAGNOSIS — D696 Thrombocytopenia, unspecified: Secondary | ICD-10-CM

## 2021-06-06 LAB — CBC WITH DIFFERENTIAL/PLATELET
Abs Immature Granulocytes: 0.04 10*3/uL (ref 0.00–0.07)
Basophils Absolute: 0 10*3/uL (ref 0.0–0.1)
Basophils Relative: 0 %
Eosinophils Absolute: 0 10*3/uL (ref 0.0–0.5)
Eosinophils Relative: 0 %
HCT: 29.9 % — ABNORMAL LOW (ref 39.0–52.0)
Hemoglobin: 10.6 g/dL — ABNORMAL LOW (ref 13.0–17.0)
Immature Granulocytes: 1 %
Lymphocytes Relative: 8 %
Lymphs Abs: 0.3 10*3/uL — ABNORMAL LOW (ref 0.7–4.0)
MCH: 30.7 pg (ref 26.0–34.0)
MCHC: 35.5 g/dL (ref 30.0–36.0)
MCV: 86.7 fL (ref 80.0–100.0)
Monocytes Absolute: 0.5 10*3/uL (ref 0.1–1.0)
Monocytes Relative: 14 %
Neutro Abs: 3 10*3/uL (ref 1.7–7.7)
Neutrophils Relative %: 77 %
Platelets: 175 10*3/uL (ref 150–400)
RBC: 3.45 MIL/uL — ABNORMAL LOW (ref 4.22–5.81)
RDW: 15.5 % (ref 11.5–15.5)
WBC: 3.8 10*3/uL — ABNORMAL LOW (ref 4.0–10.5)
nRBC: 0 % (ref 0.0–0.2)

## 2021-06-06 LAB — COMPREHENSIVE METABOLIC PANEL
ALT: 35 U/L (ref 0–44)
AST: 27 U/L (ref 15–41)
Albumin: 3.3 g/dL — ABNORMAL LOW (ref 3.5–5.0)
Alkaline Phosphatase: 57 U/L (ref 38–126)
Anion gap: 10 (ref 5–15)
BUN: 36 mg/dL — ABNORMAL HIGH (ref 8–23)
CO2: 24 mmol/L (ref 22–32)
Calcium: 8.9 mg/dL (ref 8.9–10.3)
Chloride: 99 mmol/L (ref 98–111)
Creatinine, Ser: 1.29 mg/dL — ABNORMAL HIGH (ref 0.61–1.24)
GFR, Estimated: 57 mL/min — ABNORMAL LOW (ref >60)
Glucose, Bld: 199 mg/dL — ABNORMAL HIGH (ref 70–99)
Potassium: 3.8 mmol/L (ref 3.5–5.1)
Sodium: 133 mmol/L — ABNORMAL LOW (ref 135–145)
Total Bilirubin: 0.5 mg/dL (ref 0.3–1.2)
Total Protein: 6.4 g/dL — ABNORMAL LOW (ref 6.5–8.1)

## 2021-06-06 LAB — MAGNESIUM: Magnesium: 1.9 mg/dL (ref 1.7–2.4)

## 2021-06-06 MED ORDER — SODIUM CHLORIDE 0.9 % IV SOLN
10.0000 mg | Freq: Once | INTRAVENOUS | Status: AC
  Administered 2021-06-06: 10 mg via INTRAVENOUS
  Filled 2021-06-06: qty 1
  Filled 2021-06-06: qty 10

## 2021-06-06 MED ORDER — SODIUM CHLORIDE 0.9 % IV SOLN
150.0000 mg | Freq: Once | INTRAVENOUS | Status: AC
  Administered 2021-06-06: 150 mg via INTRAVENOUS
  Filled 2021-06-06: qty 5
  Filled 2021-06-06: qty 150

## 2021-06-06 MED ORDER — SENNA 8.6 MG PO TABS: ORAL_TABLET | ORAL | 2 refills | Status: DC

## 2021-06-06 MED ORDER — PALONOSETRON HCL INJECTION 0.25 MG/5ML
0.2500 mg | Freq: Once | INTRAVENOUS | Status: AC
  Administered 2021-06-06: 0.25 mg via INTRAVENOUS
  Filled 2021-06-06: qty 5

## 2021-06-06 MED ORDER — HEPARIN SOD (PORK) LOCK FLUSH 100 UNIT/ML IV SOLN
INTRAVENOUS | Status: AC
  Administered 2021-06-06: 500 [IU]
  Filled 2021-06-06: qty 5

## 2021-06-06 MED ORDER — SODIUM CHLORIDE 0.9 % IV SOLN
Freq: Once | INTRAVENOUS | Status: AC
  Filled 2021-06-06: qty 250

## 2021-06-06 MED ORDER — POLYETHYLENE GLYCOL 3350 17 GM/SCOOP PO POWD: ORAL | 2 refills | Status: DC

## 2021-06-06 MED ORDER — HEPARIN SOD (PORK) LOCK FLUSH 100 UNIT/ML IV SOLN
500.0000 [IU] | Freq: Once | INTRAVENOUS | Status: AC | PRN
  Filled 2021-06-06: qty 5

## 2021-06-06 MED ORDER — MAGNESIUM SULFATE 2 GM/50ML IV SOLN
2.0000 g | Freq: Once | INTRAVENOUS | Status: AC
  Administered 2021-06-06: 2 g via INTRAVENOUS
  Filled 2021-06-06: qty 50

## 2021-06-06 MED ORDER — POTASSIUM CHLORIDE IN NACL 20-0.9 MEQ/L-% IV SOLN
Freq: Once | INTRAVENOUS | Status: AC
  Filled 2021-06-06 (×2): qty 1000

## 2021-06-06 MED ORDER — SODIUM CHLORIDE 0.9% FLUSH
10.0000 mL | Freq: Once | INTRAVENOUS | Status: AC
  Administered 2021-06-06: 10 mL via INTRAVENOUS
  Filled 2021-06-06: qty 10

## 2021-06-06 MED ORDER — SODIUM CHLORIDE 0.9 % IV SOLN
40.0000 mg/m2 | Freq: Once | INTRAVENOUS | Status: AC
  Administered 2021-06-06: 82 mg via INTRAVENOUS
  Filled 2021-06-06: qty 82

## 2021-06-06 NOTE — Progress Notes (Signed)
Throat stays sore. ? ?Cough keeps him up at night. ? ?Concerned about the constipation. ?

## 2021-06-06 NOTE — Progress Notes (Signed)
Pt only able to give 150 mL of urine after prefluids. Tiesha Marich NP notified. Per NP to give 1 L NS over 1 hour and re-evaluate urine output prior to proceeding with Cisplatin treatment. Pt updated and agrees with plan. ? ?After 1L NS was completed, pt attempted to give urine specimen for treatment and was unsuccessful. Bertine Schlottman NP was notified, per NP to continue with Cisplatin treatment and re-evaluate urine output prior to discharge. Pt and treatment team updated and agrees to plan.  ? ?Post Cisplatin urine output 275 mL. Treatment team updated.  ? ?Travis Arellano  ?

## 2021-06-06 NOTE — Patient Instructions (Signed)
[facility] AT [place]  Discharge Instructions: ?Thank you for choosing [facility] to provide your oncology and hematology care.  ?If you have a lab appointment with the [facility], please go directly to the [facility] and check in at the registration area. ? ?Wear comfortable clothing and clothing appropriate for easy access to any Portacath or PICC line.  ? ?We strive to give you quality time with your provider. You may need to reschedule your appointment if you arrive late (15 or more minutes).  Arriving late affects you and other patients whose appointments are after yours.  Also, if you miss three or more appointments without notifying the office, you may be dismissed from the clinic at the provider?s discretion.    ?  ?For prescription refill requests, have your pharmacy contact our office and allow 72 hours for refills to be completed.   ? ?Today you received the following chemotherapy and/or immunotherapy agents Cisplatin ?  ?To help prevent nausea and vomiting after your treatment, we encourage you to take your nausea medication as directed. ? ?BELOW ARE SYMPTOMS THAT SHOULD BE REPORTED IMMEDIATELY: ?*FEVER GREATER THAN 100.4 F (38 ?C) OR HIGHER ?*CHILLS OR SWEATING ?*NAUSEA AND VOMITING THAT IS NOT CONTROLLED WITH YOUR NAUSEA MEDICATION ?*UNUSUAL SHORTNESS OF BREATH ?*UNUSUAL BRUISING OR BLEEDING ?*URINARY PROBLEMS (pain or burning when urinating, or frequent urination) ?*BOWEL PROBLEMS (unusual diarrhea, constipation, pain near the anus) ?TENDERNESS IN MOUTH AND THROAT WITH OR WITHOUT PRESENCE OF ULCERS (sore throat, sores in mouth, or a toothache) ?UNUSUAL RASH, SWELLING OR PAIN  ?UNUSUAL VAGINAL DISCHARGE OR ITCHING  ? ?Items with * indicate a potential emergency and should be followed up as soon as possible or go to the Emergency Department if any problems should occur. ? ?Please show the CHEMOTHERAPY ALERT CARD or IMMUNOTHERAPY ALERT CARD at check-in to the  Emergency Department and triage nurse. ? ?Should you have questions after your visit or need to cancel or reschedule your appointment, please contact [facility] CANCER [place] AT [place]  [phone] and follow the prompts.  Office hours are 8:00 a.m. to 4:30 p.m. Monday - Friday. Please note that voicemails left after 4:00 p.m. may not be returned until the following business day.  We are closed weekends and major holidays. You have access to a nurse at all times for urgent questions. Please call the main number to the clinic [phone] and follow the prompts. ? ?For any non-urgent questions, you may also contact your provider using MyChart. We now offer e-Visits for anyone 36 and older to request care online for non-urgent symptoms. For details visit [email or web address]. ?  ?Also download the MyChart app! Go to the app store, search "MyChart", open the app, select [facility], and log in with your MyChart username and password. ? ?Due to Covid, a mask is required upon entering the hospital/clinic. If you do not have a mask, one will be given to you upon arrival. For doctor visits, patients may have 1 support person aged 64 or older with them. For treatment visits, patients cannot have anyone with them due to current Covid guidelines and our immunocompromised population.  ?

## 2021-06-06 NOTE — Progress Notes (Signed)
[facility] ?CONSULT NOTE ? ?Patient Care Team: ?[facility] as PCP - General ?Cammie Sickle, MD as Consulting Physician (Oncology) ?Noreene Filbert, MD as Consulting Physician (Radiation Oncology) ?Clyde Canterbury, MD as Referring Physician (Otolaryngology) ? ?CHIEF COMPLAINTS/PURPOSE OF CONSULTATION: head and neck cancer ? ?#  ?Oncology History Overview Note  ?DIAGNOSIS:  ?A. PIRIFORM SINUS LESION, LEFT; BIOPSY:  ?- INVASIVE SQUAMOUS CELL CARCINOMA, KERATINIZING.  ? ?Asymmetric soft tissue thickening and enhancement of the left ?aryepiglottic fold and adjacent paraglottic fat suspicious for ?malignancy. Abnormal right level 1/2 and left level 2 likely ?cystic/necrotic nodes. ?  ?A subcentimeter low-density lesion of the right vallecula probably ?reflects a cyst but direct visual inspection is recommended. ? ?# JAN 2023-Pyriform sinus- SCC-[Dr.Bennett]clinically T1 [~1cm; N2a]; PET scan Jan 19th, 2023- The mucosal thickening along the left piriform sinus is highlyHypermetabolic'  The contralateral right level IIa lymph node is mildly hypermetabolic along its more solid-appearing anterior inferior border, maximum SUV 4.0. The left level IIa lymph node has activity less than blood pool, although its partially cystic appearance was still unusual and suspicious.  ? ?# FEB 6th, 2023- Cisplatin-RT ?  ? ?  ?  ?Cancer of pyriform sinus ([place])  ?03/30/2021 Initial Diagnosis  ? Cancer of pyriform sinus ([place]) ?  ?04/18/2021 -  Chemotherapy  ? Patient is on Treatment Plan : HEAD/NECK Cisplatin q7d  ?   ? Cancer Staging  ? Cancer Staging  ?No matching staging information was found for the patient. ?  ?04/18/2021 Cancer Staging  ? Staging form: Pharynx - Hypopharynx, AJCC 8th Edition ?- Clinical: Stage IVA (cT1, cN2c, cM0) - Signed by Cammie Sickle, MD on 04/18/2021 ?  ? ? ?HISTORY OF PRESENTING ILLNESS: Ambulating independently. Alone. ?Travis Arellano 76 y.o. male newly diagnosed  pyriform sinus cancer/locally advanced, s/p concurrent chemo-radiation, progression, now s/p 6 cycles of weekly cisplatin. Treatment was held last week d/t oral thrust, inability to swallow secretions, poor tolerance. He has since received iv fluids, steroids, and now using peg to administer medications. Continues to have throat pain but able to swallow some secretions. Has not been using sucralfate or magic mouthwash.  ? ?Review of Systems  ?Constitutional:  Positive for malaise/fatigue and weight loss. Negative for chills, diaphoresis and fever.  ?HENT:  Positive for sore throat. Negative for nosebleeds.   ?Eyes:  Negative for double vision.  ?Respiratory:  Negative for cough, hemoptysis, sputum production, shortness of breath and wheezing.   ?Cardiovascular:  Negative for chest pain, palpitations, orthopnea and leg swelling.  ?Gastrointestinal:  Negative for abdominal pain, blood in stool, constipation, diarrhea, heartburn, melena, nausea and vomiting.  ?Genitourinary:  Negative for dysuria, frequency and urgency.  ?Musculoskeletal:  Positive for back pain and joint pain.  ?Skin: Negative.  Negative for itching and rash.  ?Neurological:  Negative for dizziness, tingling, focal weakness, weakness and headaches.  ?Endo/Heme/Allergies:  Does not bruise/bleed easily.  ?Psychiatric/Behavioral:  Negative for depression. The patient is not nervous/anxious and does not have insomnia.    ? ?MEDICAL HISTORY:  ?Past Medical History:  ?Diagnosis Date  ? Cancer of pyriform sinus ([place])   ? High cholesterol   ? Hypertension   ? Wears dentures   ? Full upper, partial lower  ? ?SURGICAL HISTORY: ?Past Surgical History:  ?Procedure Laterality Date  ? CATARACT EXTRACTION W/PHACO Right 05/17/2020  ? Procedure: CATARACT EXTRACTION PHACO AND INTRAOCULAR LENS PLACEMENT (IOC);  Surgeon: Baruch Goldmann, MD;  [facility];  Service: Ophthalmology;  Laterality: Right;  CDE: 15.03  ? CATARACT EXTRACTION W/PHACO Left 06/11/2020  ? Procedure:  CATARACT EXTRACTION PHACO AND INTRAOCULAR LENS PLACEMENT (IOC);  Surgeon: Baruch Goldmann, MD;  Location: [facility];  Service: Ophthalmology;  Laterality: Left;  CDE: 13.98  ? COLONOSCOPY    ? IR GASTROSTOMY TUBE MOD SED  05/20/2021  ? IR IMAGING GUIDED PORT INSERTION  04/05/2021  ? MICROLARYNGOSCOPY N/A 03/22/2021  ? Procedure: MICROSCOPIC DIRECT LARYNGOSCOPY WITH BIOPSY;  Surgeon: Clyde Canterbury, MD;  Location: [facility];  Service: ENT;  Laterality: N/A;  ? RIGID ESOPHAGOSCOPY N/A 03/22/2021  ? Procedure: RIGID ESOPHAGOSCOPY;  Surgeon: Clyde Canterbury, MD;  Location: [facility];  Service: ENT;  Laterality: N/A;  ? ?SOCIAL HISTORY: ?Social History  ? ?Socioeconomic History  ? Marital status: Divorced  ?  Spouse name: Not on file  ? Number of children: Not on file  ? Years of education: Not on file  ? Highest education level: Not on file  ?Occupational History  ? Not on file  ?Tobacco Use  ? Smoking status: Former  ?  Types: Cigarettes  ?  Quit date: 1999  ?  Years since quitting: 24.2  ? Smokeless tobacco: Never  ?Vaping Use  ? Vaping Use: Never used  ?Substance and Sexual Activity  ? Alcohol use: No  ? Drug use: No  ? Sexual activity: Not on file  ?Other Topics Concern  ? Not on file  ?Social History Narrative  ? Lives between [place] [[address]]; with son and brother. Quit smoking in 1999 [prior all life]; quit alcohol 30 years- used to drink beer. Retd/ Part time- auto-mechanic work.   ? ?Social Determinants of Health  ? ?Financial Resource Strain: Not on file  ?Food Insecurity: Not on file  ?Transportation Needs: Not on file  ?Physical Activity: Not on file  ?Stress: Not on file  ?Social Connections: Not on file  ?Intimate Partner Violence: Not on file  ? ? ?FAMILY HISTORY: ?Family History  ?Problem Relation Age of Onset  ? Congestive Heart Failure Mother   ? Diabetes Father   ? Lung cancer Brother   ? ? ?ALLERGIES:  has No Known Allergies. ? ?MEDICATIONS:  ?Current Outpatient  Medications  ?Medication Sig Dispense Refill  ? dexamethasone (DECADRON) 4 MG tablet Take 1 tablet (4 mg total) by mouth 2 (two) times daily. (Patient not taking: Reported on 05/30/2021) 30 tablet 0  ? doxazosin (CARDURA) 4 MG tablet Take 4 mg by mouth daily. (Patient not taking: Reported on 05/30/2021)    ? fluconazole (DIFLUCAN) 100 MG tablet Take 1 tablet (100 mg total) by mouth daily. (Patient not taking: Reported on 05/30/2021) 14 tablet 0  ? hydrochlorothiazide (MICROZIDE) 12.5 MG capsule Take 12.5 mg by mouth daily. (Patient not taking: Reported on 05/30/2021)    ? HYDROcodone-acetaminophen (HYCET) 7.5-325 mg/15 ml solution 10-15 cc PO every 4-6 hours as needed for pain (Patient not taking: Reported on 05/16/2021) 300 mL 0  ? HYDROcodone-acetaminophen (NORCO) 5-325 MG tablet Take 1 tablet by mouth every 6 (six) hours as needed for up to 15 doses for moderate pain. (Patient not taking: Reported on 05/30/2021) 15 tablet 0  ? lidocaine-prilocaine (EMLA) cream Apply 1 application topically as needed. Apply to port and cover with saran wrap 1-2 hours prior to port access (Patient not taking: Reported on 06/03/2021) 30 g 1  ? lisinopril (PRINIVIL,ZESTRIL) 20 MG tablet Take 20 mg by mouth at bedtime. (Patient not taking: Reported on 05/20/2021)    ?  magic mouthwash (nystatin, lidocaine, diphenhydrAMINE, alum & mag hydroxide) suspension Swish and swallow 5 mLs 4 (four) times daily as needed for mouth pain. 180 mL 1  ? metoprolol succinate (TOPROL-XL) 100 MG 24 hr tablet Take 100 mg by mouth daily. (Patient not taking: Reported on 05/20/2021)    ? Nutritional Supplements (ISOSOURCE 1.5 CAL) LIQD Give 2 cartons at 8am. Give 1 carton at 11am, 2pm, 5pm and 8pm. Flush with 120 ml of water before and after each feeding. Send bolus tube feeding supplies.  [facility] RN starting on 05/23/21 to provide support for TF management in the home.  Start with 2 cartons daily and then add 1 carton each day until at goal rate. 1659 mL 0  ? omeprazole  (PRILOSEC) 20 MG capsule Take 1 capsule (20 mg total) by mouth daily. (Patient not taking: Reported on 05/30/2021) 30 capsule 2  ? ondansetron (ZOFRAN) 8 MG tablet Take 1 tablet (8 mg total) by mouth every 8 (

## 2021-06-07 ENCOUNTER — Inpatient Hospital Stay: Payer: Medicare Other

## 2021-06-07 ENCOUNTER — Ambulatory Visit
Admission: RE | Admit: 2021-06-07 | Discharge: 2021-06-07 | Disposition: A | Discharge status: Home / Self Care | Payer: Medicare Other | Source: Ambulatory Visit | Attending: Radiation Oncology | Admitting: Radiation Oncology

## 2021-06-07 DIAGNOSIS — Z5111 Encounter for antineoplastic chemotherapy: Secondary | ICD-10-CM | POA: Diagnosis not present

## 2021-06-08 ENCOUNTER — Ambulatory Visit: Payer: Medicare Other

## 2021-06-08 ENCOUNTER — Ambulatory Visit
Admission: RE | Admit: 2021-06-08 | Discharge: 2021-06-08 | Disposition: A | Discharge status: Home / Self Care | Payer: Medicare Other | Source: Ambulatory Visit | Attending: Radiation Oncology | Admitting: Radiation Oncology

## 2021-06-08 DIAGNOSIS — Z5111 Encounter for antineoplastic chemotherapy: Secondary | ICD-10-CM | POA: Diagnosis not present

## 2021-06-09 ENCOUNTER — Ambulatory Visit
Admission: RE | Admit: 2021-06-09 | Discharge: 2021-06-09 | Disposition: A | Discharge status: Home / Self Care | Payer: Medicare Other | Source: Ambulatory Visit | Attending: Radiation Oncology | Admitting: Radiation Oncology

## 2021-06-09 DIAGNOSIS — Z5111 Encounter for antineoplastic chemotherapy: Secondary | ICD-10-CM | POA: Diagnosis not present

## 2021-06-10 ENCOUNTER — Inpatient Hospital Stay: Payer: Medicare Other

## 2021-06-10 ENCOUNTER — Ambulatory Visit
Admission: RE | Admit: 2021-06-10 | Discharge: 2021-06-10 | Disposition: A | Discharge status: Home / Self Care | Payer: Medicare Other | Source: Ambulatory Visit | Attending: Radiation Oncology | Admitting: Radiation Oncology

## 2021-06-10 VITALS — BP 113/77 | HR 90 | Temp 98.6°F | Resp 18

## 2021-06-10 DIAGNOSIS — Z5111 Encounter for antineoplastic chemotherapy: Secondary | ICD-10-CM | POA: Diagnosis not present

## 2021-06-10 DIAGNOSIS — C12 Malignant neoplasm of pyriform sinus: Secondary | ICD-10-CM

## 2021-06-10 MED ORDER — SODIUM CHLORIDE 0.9 % IV SOLN
Freq: Once | INTRAVENOUS | Status: AC
  Filled 2021-06-10: qty 250

## 2021-06-10 MED ORDER — SODIUM CHLORIDE 0.9% FLUSH
10.0000 mL | Freq: Once | INTRAVENOUS | Status: AC | PRN
  Administered 2021-06-10: 10 mL
  Filled 2021-06-10: qty 10

## 2021-06-10 MED ORDER — HEPARIN SOD (PORK) LOCK FLUSH 100 UNIT/ML IV SOLN
500.0000 [IU] | Freq: Once | INTRAVENOUS | Status: AC | PRN
  Administered 2021-06-10: 500 [IU]
  Filled 2021-06-10: qty 5

## 2021-06-13 ENCOUNTER — Ambulatory Visit
Admission: RE | Admit: 2021-06-13 | Discharge: 2021-06-13 | Disposition: A | Discharge status: Home / Self Care | Payer: Medicare Other | Source: Ambulatory Visit | Attending: Medical Oncology | Admitting: Medical Oncology

## 2021-06-13 ENCOUNTER — Inpatient Hospital Stay: Payer: Medicare Other | Attending: Internal Medicine | Admitting: Medical Oncology

## 2021-06-13 ENCOUNTER — Inpatient Hospital Stay: Payer: Medicare Other

## 2021-06-13 ENCOUNTER — Ambulatory Visit
Admission: RE | Admit: 2021-06-13 | Discharge: 2021-06-13 | Disposition: A | Discharge status: Home / Self Care | Payer: Medicare Other | Attending: Diagnostic Radiology | Admitting: Diagnostic Radiology

## 2021-06-13 VITALS — BP 126/82 | HR 88 | Temp 97.8°F | Resp 18 | Ht 70.0 in | Wt 159.4 lb

## 2021-06-13 DIAGNOSIS — N289 Disorder of kidney and ureter, unspecified: Secondary | ICD-10-CM | POA: Diagnosis not present

## 2021-06-13 DIAGNOSIS — Z8512 Personal history of malignant neoplasm of trachea: Secondary | ICD-10-CM | POA: Diagnosis not present

## 2021-06-13 DIAGNOSIS — R051 Acute cough: Secondary | ICD-10-CM

## 2021-06-13 DIAGNOSIS — R042 Hemoptysis: Secondary | ICD-10-CM

## 2021-06-13 DIAGNOSIS — Z801 Family history of malignant neoplasm of trachea, bronchus and lung: Secondary | ICD-10-CM | POA: Insufficient documentation

## 2021-06-13 DIAGNOSIS — Z931 Gastrostomy status: Secondary | ICD-10-CM | POA: Diagnosis not present

## 2021-06-13 DIAGNOSIS — C12 Malignant neoplasm of pyriform sinus: Secondary | ICD-10-CM | POA: Diagnosis present

## 2021-06-13 DIAGNOSIS — Z87891 Personal history of nicotine dependence: Secondary | ICD-10-CM | POA: Diagnosis not present

## 2021-06-13 DIAGNOSIS — E878 Other disorders of electrolyte and fluid balance, not elsewhere classified: Secondary | ICD-10-CM | POA: Diagnosis not present

## 2021-06-13 DIAGNOSIS — L02211 Cutaneous abscess of abdominal wall: Secondary | ICD-10-CM | POA: Insufficient documentation

## 2021-06-13 DIAGNOSIS — D696 Thrombocytopenia, unspecified: Secondary | ICD-10-CM | POA: Insufficient documentation

## 2021-06-13 DIAGNOSIS — E876 Hypokalemia: Secondary | ICD-10-CM | POA: Insufficient documentation

## 2021-06-13 DIAGNOSIS — R739 Hyperglycemia, unspecified: Secondary | ICD-10-CM

## 2021-06-13 DIAGNOSIS — I10 Essential (primary) hypertension: Secondary | ICD-10-CM | POA: Diagnosis not present

## 2021-06-13 DIAGNOSIS — T451X5A Adverse effect of antineoplastic and immunosuppressive drugs, initial encounter: Secondary | ICD-10-CM

## 2021-06-13 DIAGNOSIS — D709 Neutropenia, unspecified: Secondary | ICD-10-CM | POA: Diagnosis not present

## 2021-06-13 DIAGNOSIS — D649 Anemia, unspecified: Secondary | ICD-10-CM | POA: Diagnosis not present

## 2021-06-13 DIAGNOSIS — B370 Candidal stomatitis: Secondary | ICD-10-CM

## 2021-06-13 DIAGNOSIS — R634 Abnormal weight loss: Secondary | ICD-10-CM | POA: Insufficient documentation

## 2021-06-13 DIAGNOSIS — R070 Pain in throat: Secondary | ICD-10-CM | POA: Diagnosis not present

## 2021-06-13 DIAGNOSIS — Z95828 Presence of other vascular implants and grafts: Secondary | ICD-10-CM

## 2021-06-13 DIAGNOSIS — R6889 Other general symptoms and signs: Secondary | ICD-10-CM

## 2021-06-13 DIAGNOSIS — D6481 Anemia due to antineoplastic chemotherapy: Secondary | ICD-10-CM

## 2021-06-13 LAB — CBC WITH DIFFERENTIAL/PLATELET
Abs Immature Granulocytes: 0.09 10*3/uL — ABNORMAL HIGH (ref 0.00–0.07)
Basophils Absolute: 0 10*3/uL (ref 0.0–0.1)
Basophils Relative: 0 %
Eosinophils Absolute: 0 10*3/uL (ref 0.0–0.5)
Eosinophils Relative: 0 %
HCT: 28.3 % — ABNORMAL LOW (ref 39.0–52.0)
Hemoglobin: 10 g/dL — ABNORMAL LOW (ref 13.0–17.0)
Immature Granulocytes: 1 %
Lymphocytes Relative: 3 %
Lymphs Abs: 0.2 10*3/uL — ABNORMAL LOW (ref 0.7–4.0)
MCH: 31.1 pg (ref 26.0–34.0)
MCHC: 35.3 g/dL (ref 30.0–36.0)
MCV: 87.9 fL (ref 80.0–100.0)
Monocytes Absolute: 0.7 10*3/uL (ref 0.1–1.0)
Monocytes Relative: 8 %
Neutro Abs: 6.8 10*3/uL (ref 1.7–7.7)
Neutrophils Relative %: 88 %
Platelets: 201 10*3/uL (ref 150–400)
RBC: 3.22 MIL/uL — ABNORMAL LOW (ref 4.22–5.81)
RDW: 16.8 % — ABNORMAL HIGH (ref 11.5–15.5)
WBC: 7.8 10*3/uL (ref 4.0–10.5)
nRBC: 0 % (ref 0.0–0.2)

## 2021-06-13 LAB — COMPREHENSIVE METABOLIC PANEL
ALT: 19 U/L (ref 0–44)
AST: 23 U/L (ref 15–41)
Albumin: 3.2 g/dL — ABNORMAL LOW (ref 3.5–5.0)
Alkaline Phosphatase: 57 U/L (ref 38–126)
Anion gap: 8 (ref 5–15)
BUN: 31 mg/dL — ABNORMAL HIGH (ref 8–23)
CO2: 26 mmol/L (ref 22–32)
Calcium: 8.7 mg/dL — ABNORMAL LOW (ref 8.9–10.3)
Chloride: 98 mmol/L (ref 98–111)
Creatinine, Ser: 1.07 mg/dL (ref 0.61–1.24)
GFR, Estimated: >60 mL/min (ref >60)
Glucose, Bld: 213 mg/dL — ABNORMAL HIGH (ref 70–99)
Potassium: 3.5 mmol/L (ref 3.5–5.1)
Sodium: 132 mmol/L — ABNORMAL LOW (ref 135–145)
Total Bilirubin: 0.4 mg/dL (ref 0.3–1.2)
Total Protein: 6.1 g/dL — ABNORMAL LOW (ref 6.5–8.1)

## 2021-06-13 LAB — MAGNESIUM: Magnesium: 1.5 mg/dL — ABNORMAL LOW (ref 1.7–2.4)

## 2021-06-13 MED ORDER — HEPARIN SOD (PORK) LOCK FLUSH 100 UNIT/ML IV SOLN
INTRAVENOUS | Status: AC
  Filled 2021-06-13: qty 5

## 2021-06-13 NOTE — Progress Notes (Signed)
Pt presents with a productive cough and reports coughing up a blood. Pt has had 4lb weight loss since last week.  ?

## 2021-06-13 NOTE — Progress Notes (Addendum)
[place] ?Office Visit Note ? ?Patient Care Team: ?[facility] as PCP - General ?Cammie Sickle, MD as Consulting Physician (Oncology) ?Noreene Filbert, MD as Consulting Physician (Radiation Oncology) ?Clyde Canterbury, MD as Referring Physician (Otolaryngology) ? ?CHIEF COMPLAINTS/PURPOSE OF CONSULTATION: head and neck cancer ? ?#  ?Oncology History Overview Note  ?DIAGNOSIS:  ?A. PIRIFORM SINUS LESION, LEFT; BIOPSY:  ?- INVASIVE SQUAMOUS CELL CARCINOMA, KERATINIZING.  ? ?Asymmetric soft tissue thickening and enhancement of the left ?aryepiglottic fold and adjacent paraglottic fat suspicious for ?malignancy. Abnormal right level 1/2 and left level 2 likely ?cystic/necrotic nodes. ?  ?A subcentimeter low-density lesion of the right vallecula probably ?reflects a cyst but direct visual inspection is recommended. ? ?# JAN 2023-Pyriform sinus- SCC-[Dr.Bennett]clinically T1 [~1cm; N2a]; PET scan Jan 19th, 2023- The mucosal thickening along the left piriform sinus is highlyHypermetabolic'  The contralateral right level IIa lymph node is mildly hypermetabolic along its more solid-appearing anterior inferior border, maximum SUV 4.0. The left level IIa lymph node has activity less than blood pool, although its partially cystic appearance was still unusual and suspicious.  ? ?# FEB 6th, 2023- Cisplatin-RT ?  ? ?  ?  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  ?04/18/2021 -  Chemotherapy  ? Patient is on Treatment Plan : HEAD/NECK Cisplatin q7d  ?   ? Cancer Staging  ? Cancer Staging  ?No matching staging information was found for the patient. ?  ?04/18/2021 Cancer Staging  ? Staging form: Pharynx - Hypopharynx, AJCC 8th Edition ?- Clinical: Stage IVA (cT1, cN2c, cM0) - Signed by Cammie Sickle, MD on 04/18/2021 ? ?  ? ? ?HISTORY OF PRESENTING ILLNESS: Ambulating independently. Alone. ?Travis Arellano 76 y.o. male newly  diagnosed pyriform sinus cancer/locally advanced, s/p concurrent chemo-radiation, progression, now s/p 7 cycles of weekly cisplatin. Treatment was held 2 weeks ago due to oral thrust, inability to swallow secretions, poor tolerance. He has since received iv fluids, steroids, and now using peg to administer medications. In addition he was given nystatin, sucralfate and diflucan and has had improvement in symptoms. Today he reports that he is "not feeling too well" and has had a cough for the past few weeks. He notes hemoptysis for the past 3 days and a worsened cough. No fevers, SOB, chest pain. He has tried nyquil for symptoms without improvement. No known sick contacts. He is not on anticoagulation therapy  ? ?Review of Systems  ?Constitutional:  Positive for malaise/fatigue and weight loss. Negative for chills, diaphoresis and fever.  ?HENT:  Negative for nosebleeds and sore throat.   ?Eyes:  Negative for double vision.  ?Respiratory:  Positive for cough, hemoptysis and sputum production. Negative for shortness of breath and wheezing.   ?Cardiovascular:  Negative for chest pain, palpitations, orthopnea and leg swelling.  ?Gastrointestinal:  Negative for abdominal pain, blood in stool, constipation, diarrhea, heartburn, melena, nausea and vomiting.  ?Genitourinary:  Negative for dysuria, frequency and urgency.  ?Musculoskeletal:  Positive for back pain and joint pain.  ?Skin: Negative.  Negative for itching and rash.  ?Neurological:  Negative for dizziness, tingling, focal weakness, weakness and headaches.  ?Endo/Heme/Allergies:  Does not bruise/bleed easily.  ?Psychiatric/Behavioral:  Negative for depression. The patient is not nervous/anxious and does not have insomnia.    ? ?MEDICAL HISTORY:  ?Past Medical History:  ?Diagnosis Date  ? 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 High cholesterol   ?  Hypertension   ? Wears dentures   ? Full upper, partial lower  ? ?SURGICAL HISTORY: ?Past Surgical History:  ?Procedure  Laterality Date  ? CATARACT EXTRACTION W/PHACO Right 05/17/2020  ? Procedure: CATARACT EXTRACTION PHACO AND INTRAOCULAR LENS PLACEMENT (IOC);  Surgeon: Baruch Goldmann, MD;  Location: [facility];  Service: Ophthalmology;  Laterality: Right;  CDE: 15.03  ? CATARACT EXTRACTION W/PHACO Left 06/11/2020  ? Procedure: CATARACT EXTRACTION PHACO AND INTRAOCULAR LENS PLACEMENT (IOC);  Surgeon: Baruch Goldmann, MD;  Location: [facility];  Service: Ophthalmology;  Laterality: Left;  CDE: 13.98  ? COLONOSCOPY    ? IR GASTROSTOMY TUBE MOD SED  05/20/2021  ? IR IMAGING GUIDED PORT INSERTION  04/05/2021  ? MICROLARYNGOSCOPY N/A 03/22/2021  ? Procedure: MICROSCOPIC DIRECT LARYNGOSCOPY WITH BIOPSY;  Surgeon: Clyde Canterbury, MD;  Location: [place];  Service: ENT;  Laterality: N/A;  ? RIGID ESOPHAGOSCOPY N/A 03/22/2021  ? Procedure: RIGID ESOPHAGOSCOPY;  Surgeon: Clyde Canterbury, MD;  Location: [place];  Service: ENT;  Laterality: N/A;  ? ?SOCIAL HISTORY: ?Social History  ? ?Socioeconomic History  ? Marital status: Divorced  ?  Spouse name: Not on file  ? Number of children: Not on file  ? Years of education: Not on file  ? Highest education level: Not on file  ?Occupational History  ? Not on file  ?Tobacco Use  ? Smoking status: Former  ?  Types: Cigarettes  ?  Quit date: 1999  ?  Years since quitting: 24.2  ? Smokeless tobacco: Never  ?Vaping Use  ? Vaping Use: Never used  ?Substance and Sexual Activity  ? Alcohol use: No  ? Drug use: No  ? Sexual activity: Not on file  ?Other Topics Concern  ? Not on file  ?Social History Narrative  ? Lives between [place] [[address]]; with son and brother. Quit smoking in 1999 [prior all life]; quit alcohol 30 years- used to drink beer. Retd/ Part time- auto-mechanic work.   ? ?Social Determinants of Health  ? ?Financial Resource Strain: Not on file  ?Food Insecurity: Not on file  ?Transportation Needs: Not on file  ?Physical Activity: Not on file  ?Stress: Not on file   ?Social Connections: Not on file  ?Intimate Partner Violence: Not on file  ? ? ?FAMILY HISTORY: ?Family History  ?Problem Relation Age of Onset  ? Congestive Heart Failure Mother   ? Diabetes Father   ? Lung cancer Brother   ? ? ?ALLERGIES:  has No Known Allergies. ? ?MEDICATIONS:  ?Current Outpatient Medications  ?Medication Sig Dispense Refill  ? magic mouthwash (nystatin, lidocaine, diphenhydrAMINE, alum & mag hydroxide) suspension Swish and swallow 5 mLs 4 (four) times daily as needed for mouth pain. 180 mL 1  ? Nutritional Supplements (ISOSOURCE 1.5 CAL) LIQD Give 2 cartons at 8am. Give 1 carton at 11am, 2pm, 5pm and 8pm. Flush with 120 ml of water before and after each feeding. Send bolus tube feeding supplies.  [place] RN starting on 05/23/21 to provide support for TF management in the home.  Start with 2 cartons daily and then add 1 carton each day until at goal rate. 1659 mL 0  ? polyethylene glycol powder (MIRALAX) 17 GM/SCOOP powder Take 2 cap fulls 1-2 times daily as needed. 255 g 2  ? potassium chloride 20 MEQ/15ML (10%) SOLN Take 15 mLs (20 mEq total) by mouth daily. 473 mL 0  ? senna (SENOKOT) 8.6 MG TABS tablet Take 2 tablets 1-2 times daily. [place]  tablet 2  ? dexamethasone (DECADRON) 4 MG tablet Take 1 tablet (4 mg total) by mouth 2 (two) times daily. (Patient not taking: Reported on 05/30/2021) 30 tablet 0  ? doxazosin (CARDURA) 4 MG tablet Take 4 mg by mouth daily. (Patient not taking: Reported on 05/30/2021)    ? fluconazole (DIFLUCAN) 100 MG tablet Take 1 tablet (100 mg total) by mouth daily. (Patient not taking: Reported on 05/30/2021) 14 tablet 0  ? hydrochlorothiazide (MICROZIDE) 12.5 MG capsule Take 12.5 mg by mouth daily. (Patient not taking: Reported on 05/30/2021)    ? HYDROcodone-acetaminophen (HYCET) 7.5-325 mg/15 ml solution 10-15 cc PO every 4-6 hours as needed for pain (Patient not taking: Reported on 05/16/2021) 300 mL 0  ? HYDROcodone-acetaminophen (NORCO) 5-325 MG tablet Take 1 tablet by mouth  every 6 (six) hours as needed for up to 15 doses for moderate pain. (Patient not taking: Reported on 05/30/2021) 15 tablet 0  ? lidocaine-prilocaine (EMLA) cream Apply 1 application topically as needed. Apply to p

## 2021-06-13 NOTE — Addendum Note (Signed)
Addended by: Nelwyn Salisbury on: 06/13/2021 03:53 PM ? ? Modules accepted: Orders ? ?

## 2021-06-14 ENCOUNTER — Telehealth: Payer: Self-pay | Admitting: Medical Oncology

## 2021-06-14 ENCOUNTER — Inpatient Hospital Stay: Payer: Medicare Other

## 2021-06-14 NOTE — Progress Notes (Signed)
Nutrition ? ?RD called patient for scheduled telephone visit.  No answer. Left message with call back number asking patient to return call. ? ?Cheridan Kibler [name], RD, LDN ?Registered Dietitian ?[phone] ? ?

## 2021-06-14 NOTE — Telephone Encounter (Signed)
Asked him to call back. Wanting to discuss his chest x ray which was inconclusive in his throat area but did not show pneumonia. I have placed a referral to ENT for him for further evaulation.  ?

## 2021-06-16 ENCOUNTER — Inpatient Hospital Stay: Payer: Medicare Other | Admitting: Medical Oncology

## 2021-06-20 ENCOUNTER — Emergency Department: Payer: Medicare Other

## 2021-06-20 ENCOUNTER — Telehealth: Payer: Self-pay | Admitting: *Deleted

## 2021-06-20 ENCOUNTER — Other Ambulatory Visit: Payer: Self-pay | Admitting: Internal Medicine

## 2021-06-20 ENCOUNTER — Inpatient Hospital Stay (HOSPITAL_BASED_OUTPATIENT_CLINIC_OR_DEPARTMENT_OTHER): Payer: Medicare Other | Admitting: Hospice and Palliative Medicine

## 2021-06-20 ENCOUNTER — Other Ambulatory Visit: Payer: Self-pay

## 2021-06-20 ENCOUNTER — Emergency Department
Admission: EM | Admit: 2021-06-20 | Discharge: 2021-06-20 | Disposition: A | Discharge status: Home / Self Care | Payer: Medicare Other | Attending: Student in an Organized Health Care Education/Training Program | Admitting: Student in an Organized Health Care Education/Training Program

## 2021-06-20 ENCOUNTER — Telehealth: Payer: Self-pay | Admitting: Speech Pathology

## 2021-06-20 VITALS — BP 113/78 | HR 100 | Temp 97.8°F | Resp 16

## 2021-06-20 DIAGNOSIS — L0291 Cutaneous abscess, unspecified: Secondary | ICD-10-CM

## 2021-06-20 DIAGNOSIS — C12 Malignant neoplasm of pyriform sinus: Secondary | ICD-10-CM

## 2021-06-20 DIAGNOSIS — L02211 Cutaneous abscess of abdominal wall: Secondary | ICD-10-CM | POA: Diagnosis present

## 2021-06-20 LAB — CBC
HCT: 29.5 % — ABNORMAL LOW (ref 39.0–52.0)
Hemoglobin: 9.9 g/dL — ABNORMAL LOW (ref 13.0–17.0)
MCH: 30.8 pg (ref 26.0–34.0)
MCHC: 33.6 g/dL (ref 30.0–36.0)
MCV: 91.9 fL (ref 80.0–100.0)
Platelets: 221 10*3/uL (ref 150–400)
RBC: 3.21 MIL/uL — ABNORMAL LOW (ref 4.22–5.81)
RDW: 18.4 % — ABNORMAL HIGH (ref 11.5–15.5)
WBC: 5.5 10*3/uL (ref 4.0–10.5)
nRBC: 0 % (ref 0.0–0.2)

## 2021-06-20 LAB — COMPREHENSIVE METABOLIC PANEL
ALT: 20 U/L (ref 0–44)
AST: 18 U/L (ref 15–41)
Albumin: 3 g/dL — ABNORMAL LOW (ref 3.5–5.0)
Alkaline Phosphatase: 63 U/L (ref 38–126)
Anion gap: 6 (ref 5–15)
BUN: 36 mg/dL — ABNORMAL HIGH (ref 8–23)
CO2: 32 mmol/L (ref 22–32)
Calcium: 9.1 mg/dL (ref 8.9–10.3)
Chloride: 100 mmol/L (ref 98–111)
Creatinine, Ser: 1.25 mg/dL — ABNORMAL HIGH (ref 0.61–1.24)
GFR, Estimated: 59 mL/min — ABNORMAL LOW (ref >60)
Glucose, Bld: 141 mg/dL — ABNORMAL HIGH (ref 70–99)
Potassium: 4.5 mmol/L (ref 3.5–5.1)
Sodium: 138 mmol/L (ref 135–145)
Total Bilirubin: 1 mg/dL (ref 0.3–1.2)
Total Protein: 6.5 g/dL (ref 6.5–8.1)

## 2021-06-20 LAB — LACTIC ACID, PLASMA: Lactic Acid, Venous: 1.2 mmol/L (ref 0.5–1.9)

## 2021-06-20 MED ORDER — IOHEXOL 300 MG/ML  SOLN
100.0000 mL | Freq: Once | INTRAMUSCULAR | Status: AC | PRN
  Administered 2021-06-20: 100 mL via INTRAVENOUS

## 2021-06-20 MED ORDER — LIDOCAINE HCL (PF) 1 % IJ SOLN
5.0000 mL | Freq: Once | INTRAMUSCULAR | Status: AC
  Administered 2021-06-20: 5 mL via INTRADERMAL
  Filled 2021-06-20: qty 5

## 2021-06-20 MED ORDER — HYDROMORPHONE HCL 1 MG/ML IJ SOLN
0.5000 mg | INTRAMUSCULAR | Status: DC | PRN
  Administered 2021-06-20: 0.5 mg via INTRAVENOUS
  Filled 2021-06-20: qty 1

## 2021-06-20 MED ORDER — AMOXICILLIN-POT CLAVULANATE 250-62.5 MG/5ML PO SUSR: 250.0000 mg | Freq: Two times a day (BID) | ORAL | 0 refills | Status: AC

## 2021-06-20 MED ORDER — SODIUM CHLORIDE 0.9 % IV BOLUS
500.0000 mL | Freq: Once | INTRAVENOUS | Status: AC
Start: 2021-06-20 — End: 2021-06-20
  Administered 2021-06-20: 500 mL via INTRAVENOUS

## 2021-06-20 MED ORDER — BUPIVACAINE HCL (PF) 0.5 % IJ SOLN
30.0000 mL | Freq: Once | INTRAMUSCULAR | Status: AC
  Administered 2021-06-20: 30 mL
  Filled 2021-06-20: qty 30

## 2021-06-20 MED ORDER — AMOXICILLIN-POT CLAVULANATE 400-57 MG/5ML PO SUSR
500.0000 mg | Freq: Three times a day (TID) | ORAL | Status: DC
  Administered 2021-06-20: 500 mg via ORAL
  Filled 2021-06-20: qty 6.25

## 2021-06-20 NOTE — Telephone Encounter (Signed)
Called pt and offered evaluation in [facility]. Pt agreeable. Appointment scheduled.  ?

## 2021-06-20 NOTE — ED Notes (Signed)
Dc iinstructions and scripts reviewed with pt no questions or concerns at this time. Will follow up with surgeon.  ?

## 2021-06-20 NOTE — ED Provider Notes (Signed)
? ?[facility] ?Provider Note ? ? ? Event Date/Time  ? First MD Initiated Contact with Patient 06/20/21 1208   ?  (approximate) ? ? ?History  ? ?Wound Infection ? ? ?HPI ? ?Travis Arellano is a 76 y.o. 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  Denies any cough or congestion.  No measured fevers not currently on any antibiotics. ?  ? ? ?Physical Exam  ? ?Triage Vital Signs: ?ED Triage Vitals  ?Enc Vitals Group  ?   BP 06/20/21 1121 111/75  ?   Pulse Rate 06/20/21 1121 97  ?   Resp 06/20/21 1121 20  ?   Temp 06/20/21 1121 97.8 ?F (36.6 ?C)  ?   Temp Source 06/20/21 1121 Oral  ?   SpO2 06/20/21 1121 97 %  ?   Weight 06/20/21 1122 155 lb (70.3 kg)  ?   Height 06/20/21 1122 '5\' 7"'$  (1.702 m)  ?   Head Circumference --   ?   Peak Flow --   ?   Pain Score 06/20/21 1124 3  ?   Pain Loc --   ?   Pain Edu? --   ?   Excl. in [place]? --   ? ? ?Most recent vital signs: ?Vitals:  ? 06/20/21 1230 06/20/21 1300  ?BP: 109/72 120/79  ?Pulse: 86 91  ?Resp: 20   ?Temp:    ?SpO2: 99% 98%  ? ? ? ?Constitutional: Alert  ?Eyes: Conjunctivae are normal.  ?Head: Atraumatic. ?Nose: No congestion/rhinnorhea. ?Mouth/Throat: Mucous membranes are moist.   ?Neck: Painless ROM.  ?Cardiovascular:   Good peripheral circulation. ?Respiratory: Normal respiratory effort.  No retractions.  ?Gastrointestinal: Soft and nontender.  ?Musculoskeletal:  no deformity ?Neurologic:  MAE spontaneously. No gross focal neurologic deficits are appreciated.  ?Skin:  Skin is warm, dry and intact. No rash noted. ?Psychiatric: Mood and affect are normal. Speech and behavior are normal. ? ? ? ?ED Results / Procedures / Treatments  ? ?Labs ?(all labs ordered are listed, but only abnormal results are displayed) ?Labs Reviewed  ?CBC -  Abnormal; Notable for the following components:  ?    Result Value  ? RBC 3.21 (*)   ? Hemoglobin 9.9 (*)   ? HCT 29.5 (*)   ? RDW 18.4 (*)   ? All other components within normal limits  ?COMPREHENSIVE METABOLIC PANEL - Abnormal; Notable for the following components:  ? Glucose, Bld 141 (*)   ? BUN 36 (*)   ? Creatinine, Ser 1.25 (*)   ? Albumin 3.0 (*)   ? GFR, Estimated 59 (*)   ? All other components within normal limits  ?AEROBIC CULTURE W GRAM STAIN (SUPERFICIAL SPECIMEN)  ?LACTIC ACID, PLASMA  ? ? ? ?EKG ? ? ? ? ?RADIOLOGY ?Please see ED Course for my review and interpretation. ? ?I personally reviewed all radiographic images ordered to evaluate for the above acute complaints and reviewed radiology reports and findings.  These findings were personally discussed with the patient.  Please see medical record for radiology report. ? ? ? ?PROCEDURES: ? ?Critical Care performed: No ? ?Marland Kitchen.Incision and Drainage ? ?Date/Time: 06/20/2021 4:09 PM ?Performed by: Merlyn Lot, MD ?Authorized by: Merlyn Lot, MD  ? ?Consent:  ?  Consent obtained:  Verbal ?  Consent given by:  Patient ?  Risks discussed:  Bleeding, infection, incomplete drainage and pain ?  Alternatives discussed:  Alternative treatment, delayed treatment and observation ?Location:  ?  Type:  Abscess ?  Location:  Trunk ?  Trunk location:  Abdomen ?Pre-procedure details:  ?  Skin preparation:  Povidone-iodine ?Anesthesia:  ?  Anesthesia method:  Local infiltration ?  Local anesthetic:  Lidocaine 1% w/o epi and bupivacaine 0.25% w/o epi ?Procedure type:  ?  Complexity:  Complex ?Procedure details:  ?  Incision types:  Stab incision ?  Incision depth:  Subcutaneous ?  Wound management:  Probed and deloculated and extensive cleaning ?  Drainage:  Purulent ?  Drainage amount:  Moderate ?  Wound treatment:  Wound left open ?  Packing materials:  1/4 in iodoform gauze ?Post-procedure details:  ?  Procedure completion:  Tolerated well, no immediate  complications ? ? ?MEDICATIONS ORDERED IN ED: ?Medications  ?HYDROmorphone (DILAUDID) injection 0.5 mg (0.5 mg Intravenous Given 06/20/21 1534)  ?bupivacaine(PF) (MARCAINE) 0.5 % injection 30 mL (has no administration in time range)  ?amoxicillin-clavulanate (AUGMENTIN) 250-62.5 MG/5ML suspension 500 mg (has no administration in time range)  ?iohexol (OMNIPAQUE) 300 MG/ML solution 100 mL (100 mLs Intravenous Contrast Given 06/20/21 1325)  ?sodium chloride 0.9 % bolus 500 mL (500 mLs Intravenous New Bag/Given 06/20/21 1509)  ?lidocaine (PF) (XYLOCAINE) 1 % injection 5 mL (5 mLs Intradermal Given 06/20/21 1510)  ? ? ? ?IMPRESSION / MDM / ASSESSMENT AND PLAN / ED COURSE  ?I reviewed the triage vital signs and the nursing notes. ?             ?               ? ?Differential diagnosis includes, but is not limited to, abscess, cellulitis, foreign body, fistula, mass ? ?Patient presenting to the ER for evaluation of wound as described above.  Does appear to to have abscess likely connecting with PEG tube.  Will order CT imaging as well as blood work for further evaluation. ? ? ?Clinical Course as of 06/20/21 1611  ?Mon Jun 20, 2021  ?1412 CT abdomen pelvis on my review and interpretation shows a large fluid collection just adjacent to the PEG tube concerning be communicating with gastric content but will await formal radiology report [PR]  ?  ?Clinical Course User Index ?[PR] Merlyn Lot, MD  ? ?Case discussed in consultation with Dr. Hampton Abbot of surgery.  Agree to plan for I&D which I performed.  Was packed with iodoform gauze.  He is not septic.  Discussed option for admission hospital for IV antibiotics but patient states he otherwise feels well and this has been present for quite some time therefore feels comfortable trial of outpatient antibiotics and follow-up with surgery clinic in a few days for recheck and further management.   ? ? ?FINAL CLINICAL IMPRESSION(S) / ED DIAGNOSES  ? ?Final diagnoses:  ?Abscess   ? ? ? ?Rx / DC Orders  ? ?ED Discharge Orders   ? ?      Ordered  ?  amoxicillin-clavulanate (AUGMENTIN) 250-62.5 MG/5ML suspension  2 times daily       ? 06/20/21 1608  ? ?  ?  ? ?  ? ? ? ?Note:  This document was prepared using Dragon voice recognition software and may include unintentional dictation errors. ? ?  ?Merlyn Lot, MD ?06/20/21 1611 ? ?

## 2021-06-20 NOTE — Progress Notes (Addendum)
Pt added to smc this morning with concern of infection around his peg tube. Upon observation, large, red, indurated, pustulant area is noted adjacent to the right side of peg tube. Pt states that area started about 1 week ago, but has worsened. He states that he cleans around his peg twice a day and has been applying antibiotic ointment to site.  ?

## 2021-06-20 NOTE — Progress Notes (Signed)
? ?[facility] ?[place] [facility] ?Telephone:([phone]) [phone] Fax:([phone]) [phone] ? ?Patient Care Team: ?[facility] as PCP - General ?Cammie Sickle, MD as Consulting Physician (Oncology) ?Noreene Filbert, MD as Consulting Physician (Radiation Oncology) ?Clyde Canterbury, MD as Referring Physician (Otolaryngology)  ? ?Name of the patient: Travis Arellano  ?[id number]  ?1945/07/31  ? ?Date of visit: 06/20/21 ? ?Reason for Consult: ?Travis Arellano is a 76 y.o. 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   ? ?Patient has had significant dysphagia through treatment requiring PEG placement on 3/10 by IR. ? ?Patient presents to [facility] today for evaluation of a PEG infection.  He states that he noticed the site adjacent to his PEG started becoming red and swollen about a week ago.  It has worsened over the past week and is now draining pus.  He denies fever or chills.  Site is painful.  PEG is still functional. ? ?Denies any neurologic complaints. Denies recent fevers or illnesses. Denies any easy bleeding or bruising. Reports fair appetite and denies weight loss. Denies chest pain. Denies any nausea, vomiting, constipation, or diarrhea. Denies urinary complaints. Patient offers no further specific complaints today. ? ?PAST MEDICAL HISTORY: ?Past Medical History:  ?Diagnosis Date  ? 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 High cholesterol   ? Hypertension   ? Wears dentures   ? Full upper, partial lower  ? ? ?PAST SURGICAL HISTORY:  ?Past Surgical History:  ?Procedure Laterality Date  ? CATARACT EXTRACTION W/PHACO Right 05/17/2020  ? Procedure: CATARACT EXTRACTION PHACO AND INTRAOCULAR LENS PLACEMENT (IOC);  Surgeon: Baruch Goldmann, MD;  [facility];  Service: Ophthalmology;  Laterality: Right;  CDE: 15.03  ? CATARACT EXTRACTION W/PHACO Left 06/11/2020  ? Procedure: CATARACT  EXTRACTION PHACO AND INTRAOCULAR LENS PLACEMENT (IOC);  Surgeon: Baruch Goldmann, MD;  Location: [facility];  Service: Ophthalmology;  Laterality: Left;  CDE: 13.98  ? COLONOSCOPY    ? IR GASTROSTOMY TUBE MOD SED  05/20/2021  ? IR IMAGING GUIDED PORT INSERTION  04/05/2021  ? MICROLARYNGOSCOPY N/A 03/22/2021  ? Procedure: MICROSCOPIC DIRECT LARYNGOSCOPY WITH BIOPSY;  Surgeon: Clyde Canterbury, MD;  Location: [place];  Service: ENT;  Laterality: N/A;  ? RIGID ESOPHAGOSCOPY N/A 03/22/2021  ? Procedure: RIGID ESOPHAGOSCOPY;  Surgeon: Clyde Canterbury, MD;  Location: [place];  Service: ENT;  Laterality: N/A;  ? ? ?HEMATOLOGY/ONCOLOGY HISTORY:  ?Oncology History Overview Note  ?DIAGNOSIS:  ?A. PIRIFORM SINUS LESION, LEFT; BIOPSY:  ?- INVASIVE SQUAMOUS CELL CARCINOMA, KERATINIZING.  ? ?Asymmetric soft tissue thickening and enhancement of the left ?aryepiglottic fold and adjacent paraglottic fat suspicious for ?malignancy. Abnormal right level 1/2 and left level 2 likely ?cystic/necrotic nodes. ?  ?A subcentimeter low-density lesion of the right vallecula probably ?reflects a cyst but direct visual inspection is recommended. ? ?# JAN 2023-Pyriform sinus- SCC-[Dr.Bennett]clinically T1 [~1cm; N2a]; PET scan Jan 19th, 2023- The mucosal thickening along the left piriform sinus is highlyHypermetabolic'  The contralateral right level IIa lymph node is mildly hypermetabolic along its more solid-appearing anterior inferior border, maximum SUV 4.0. The left level IIa lymph node has activity less than blood pool, although its partially cystic appearance was still unusual and suspicious.  ? ?# FEB 6th, 2023- Cisplatin-RT ?  ? ?  ?  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  ?04/18/2021 -  Chemotherapy  ? Patient is on Treatment Plan : HEAD/NECK Cisplatin q7d  ?   ? Cancer Staging  ? Cancer Staging  ?No matching staging information was found for the patient. ?  ?04/18/2021  Cancer Staging  ? Staging form: Pharynx - Hypopharynx, AJCC 8th Edition ?- Clinical: Stage IVA (cT1, cN2c, cM0) - Signed by Cammie Sickle, MD on 04/18/2021 ?  ? ? ?ALLERGIES:  has No Known Allergies. ? ?MEDICATIONS:  ?Current Outpatient Medications  ?Medication Sig Dispense Refill  ? dexamethasone (DECADRON) 4 MG tablet Take 1 tablet (4 mg total) by mouth 2 (two) times daily. (Patient not taking: Reported on 05/30/2021) 30 tablet 0  ? doxazosin (CARDURA) 4 MG tablet Take 4 mg by mouth daily. (Patient not taking: Reported on 05/30/2021)    ? fluconazole (DIFLUCAN) 100 MG tablet Take 1 tablet (100 mg total) by mouth daily. (Patient not taking: Reported on 05/30/2021) 14 tablet 0  ? hydrochlorothiazide (MICROZIDE) 12.5 MG capsule Take 12.5 mg by mouth daily. (Patient not taking: Reported on 05/30/2021)    ? HYDROcodone-acetaminophen (HYCET) 7.5-325 mg/15 ml solution 10-15 cc PO every 4-6 hours as needed for pain (Patient not taking: Reported on 05/16/2021) 300 mL 0  ? HYDROcodone-acetaminophen (NORCO) 5-325 MG tablet Take 1 tablet by mouth every 6 (six) hours as needed for up to 15 doses for moderate pain. (Patient not taking: Reported on 05/30/2021) 15 tablet 0  ? lidocaine-prilocaine (EMLA) cream Apply 1 application topically as needed. Apply to port and cover with saran wrap 1-2 hours prior to port access (Patient not taking: Reported on 06/03/2021) 30 g 1  ? lisinopril (PRINIVIL,ZESTRIL) 20 MG tablet Take 20 mg by mouth at bedtime. (Patient not taking: Reported on 05/20/2021)    ? magic mouthwash (nystatin, lidocaine, diphenhydrAMINE, alum & mag hydroxide) suspension Swish and swallow 5 mLs 4 (four) times daily as needed for mouth pain. 180 mL 1  ? metoprolol succinate (TOPROL-XL) 100 MG 24 hr tablet Take 100 mg by mouth daily. (Patient not taking: Reported on 05/20/2021)    ? Nutritional Supplements (ISOSOURCE 1.5 CAL) LIQD Give 2 cartons at 8am. Give 1 carton at 11am, 2pm, 5pm and 8pm. Flush with 120 ml of water  before and after each feeding. Send bolus tube feeding supplies.  [place]net[place] RN starting on 05/23/21 to provide support for TF management in the home.  Start with 2 cartons daily and then add 1 carton each day until at goal rate. 1659 mL 0  ? omeprazole (PRILOSEC) 20 MG capsule Take 1 capsule (20 mg total) by mouth daily. (Patient not taking: Reported on 05/30/2021) 30 capsule 2  ? ondansetron (ZOFRAN) 8 MG tablet Take 1 tablet (8 mg total) by mouth every 8 (eight) hours as needed for nausea or vomiting (start 3 days; after chemo). (Patient not taking: Reported on 05/30/2021) 40 tablet 1  ? polyethylene glycol powder (MIRALAX) 17 GM/SCOOP powder Take 2 cap fulls 1-2 times daily as needed. 255 g 2  ? potassium chloride 20 MEQ/15ML (10%) SOLN Take 15 mLs (20 mEq total) by mouth daily. 473 mL 0  ? pravastatin (PRAVACHOL) 40 MG tablet Take 40 mg by mouth at bedtime. (Patient not taking: Reported on 05/30/2021)    ? prochlorperazine (COMPAZINE) 10 MG tablet Take 1 tablet (10 mg total) by mouth every 6 (six) hours as needed for nausea or vomiting. (Patient not taking: Reported on 05/30/2021) 40 tablet 1  ? senna (SENOKOT) 8.6 MG TABS tablet Take 2 tablets 1-2 times  daily. 120 tablet 2  ? sucralfate (CARAFATE) 1 GM/10ML suspension Take 10 mLs (1 g total) by mouth 4 (four) times daily -  with meals and at bedtime. (Patient not taking: Reported on 05/30/2021) 420 mL 0  ? ?No current facility-administered medications for this visit.  ? ?Facility-Administered Medications Ordered in Other Visits  ?Medication Dose Route Frequency Provider Last Rate Last Admin  ? heparin lock flush 100 UNIT/ML injection           ? ? ?VITAL SIGNS: ?There were no vitals taken for this visit. ?There were no vitals filed for this visit. ?  ?Estimated body mass index is 22.87 kg/m? as calculated from the following: ?  Height as of 06/13/21: '5\' 10"'$  (1.778 m). ?  Weight as of 06/13/21: 159 lb 6.4 oz (72.3 kg). ? ?LABS: ?CBC: ?   ?Component Value Date/Time  ? WBC 7.8  06/13/2021 0822  ? HGB 10.0 (L) 06/13/2021 2992  ? HCT 28.3 (L) 06/13/2021 4268  ? PLT 201 06/13/2021 0822  ? MCV 87.9 06/13/2021 0822  ? NEUTROABS 6.8 06/13/2021 0822  ? LYMPHSABS 0.2 (L) 06/13/2021 3419  ? MONOABS

## 2021-06-20 NOTE — Telephone Encounter (Signed)
I called pt to remind him of his ST appt tomorrow morning. Unfortunately he is in [facility] ED with possible infection at his PEG site. Will cancel appt tomorrow and will call pt later this week to reschedule another appt.  ? ?Addaline Peplinski B. Rutherford Nail, M.S., CCC-SLP, CBIS ?Speech-Language Pathologist ?Rehabilitation Services ?Office [phone] ? ?

## 2021-06-20 NOTE — ED Notes (Signed)
1 set blood cultures, green. Lav and grey on ice sent to lab ?

## 2021-06-20 NOTE — ED Triage Notes (Signed)
Pt was sent from [facility] for infection around his peg tube, pt c/o pain and irritation for the past week.  ?

## 2021-06-20 NOTE — Telephone Encounter (Signed)
Patient called reporting that he thinks he has an infection around his feeding tube. He states that there is redness with white bumps on one sode of it and that this has been like this for about a week. It is no better or worse than last week. It is sore in that area. Please adivse ?

## 2021-06-21 ENCOUNTER — Ambulatory Visit: Payer: Medicare Other | Admitting: Speech Pathology

## 2021-06-21 ENCOUNTER — Telehealth: Payer: Self-pay

## 2021-06-21 NOTE — Telephone Encounter (Signed)
Nutrition ? ?Called patient for nutrition follow-up.  No answer.  Left message with contact number requesting a call back. ? ?Maybree Riling [name], RD, LDN ?Registered Dietitian ?[phone] ? ?

## 2021-06-24 ENCOUNTER — Ambulatory Visit (INDEPENDENT_AMBULATORY_CARE_PROVIDER_SITE_OTHER): Payer: Medicare Other | Admitting: Surgery

## 2021-06-24 ENCOUNTER — Encounter: Payer: Self-pay | Admitting: Surgery

## 2021-06-24 VITALS — BP 125/88 | HR 101 | Temp 97.6°F | Ht 71.0 in | Wt 153.6 lb

## 2021-06-24 DIAGNOSIS — L02211 Cutaneous abscess of abdominal wall: Secondary | ICD-10-CM

## 2021-06-24 NOTE — Patient Instructions (Signed)
Change your packing once a day. You may change your outer gauze as much as needed. ? ?When you pack the wound, be sure to fill the cavity fully with the packing gauze, then cover with a top 4x4 and tape in place. ? ?Follow up here in 1 week. ? ?Be sure to call us with any signs of infection: increasing redness, increasing pain, fever over 101, chills.  ?

## 2021-06-25 ENCOUNTER — Encounter: Payer: Self-pay | Admitting: Surgery

## 2021-06-25 NOTE — Progress Notes (Signed)
?06/24/2021 ? ?Reason for Visit:  Abdominal wall abscess ? ?History of Present Illness: ?Travis Arellano is a 76 y.o. male presenting for evaluation of an anterior abdominal wall abscess.  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  He had a G tube placed by IR on 05/20/21 for tube feeds.  He presented to the ED on 06/20/21 with a week history of worsening pain/redness and swelling in the abdominal wall adjacent to the G tube.  He had a CT scan which showed a 4.1 x 3.2 cm abscess.  I have personally viewed the images.  The abscess is adjacent to the track that the G tube makes, but it is unclear if it's in communication with the track itself.  Patient had an I&D, the wound was packed with iodoform, and he presents today for further management.  The patient reports that he has not changed the dressing since the I&D was done in the ED.  Reports improved pain but that it is still tender.  Denies any fevers, chills, chest pain, shortness of breath.  He's taking the Augmentin prescribed.   ? ?Past Medical History: ?Past Medical History:  ?Diagnosis Date  ? 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 High cholesterol   ? Hypertension   ? Wears dentures   ? Full upper, partial lower  ?  ? ?Past Surgical History: ?Past Surgical History:  ?Procedure Laterality Date  ? CATARACT EXTRACTION W/PHACO Right 05/17/2020  ? Procedure: CATARACT EXTRACTION PHACO AND INTRAOCULAR LENS PLACEMENT (IOC);  Surgeon: Baruch Goldmann, MD;  Location: [facility];  Service: Ophthalmology;  Laterality: Right;  CDE: 15.03  ? CATARACT EXTRACTION W/PHACO Left 06/11/2020  ? Procedure: CATARACT EXTRACTION PHACO AND INTRAOCULAR LENS PLACEMENT (IOC);  Surgeon: Baruch Goldmann, MD;  Location: [facility];  Service: Ophthalmology;  Laterality: Left;  CDE: 13.98  ? COLONOSCOPY    ? IR GASTROSTOMY TUBE MOD SED  05/20/2021  ? IR IMAGING GUIDED PORT INSERTION  04/05/2021  ? MICROLARYNGOSCOPY N/A 03/22/2021  ? Procedure: MICROSCOPIC DIRECT  LARYNGOSCOPY WITH BIOPSY;  Surgeon: Clyde Canterbury, MD;  Location: [place];  Service: ENT;  Laterality: N/A;  ? RIGID ESOPHAGOSCOPY N/A 03/22/2021  ? Procedure: RIGID ESOPHAGOSCOPY;  Surgeon: Clyde Canterbury, MD;  Location: [place];  Service: ENT;  Laterality: N/A;  ? ? ?Home Medications: ?Prior to Admission medications   ?Medication Sig Start Date End Date Taking? Authorizing Provider  ?amoxicillin-clavulanate (AUGMENTIN) 250-62.5 MG/5ML suspension Take 5 mLs (250 mg total) by mouth 2 (two) times daily for 10 days. 06/20/21 06/30/21 Yes Merlyn Lot, MD  ?Nutritional Supplements (ISOSOURCE 1.5 CAL) LIQD Give 2 cartons at 8am. Give 1 carton at 11am, 2pm, 5pm and 8pm. Flush with 120 ml of water before and after each feeding. Send bolus tube feeding supplies.  [place] RN starting on 05/23/21 to provide support for TF management in the home.  Start with 2 cartons daily and then add 1 carton each day until at goal rate. 05/19/21  Yes Cammie Sickle, MD  ?polyethylene glycol powder (MIRALAX) 17 GM/SCOOP powder Take 2 cap fulls 1-2 times daily as needed. 06/06/21  Yes Verlon Au, NP  ?potassium chloride 20 MEQ/15ML (10%) SOLN Take 15 mLs (20 mEq total) by mouth daily. 06/03/21  Yes Borders, Kirt Boys, NP  ?senna (SENOKOT) 8.6 MG TABS tablet Take 2 tablets 1-2 times daily. 06/06/21  Yes Verlon Au, NP  ? ? ?Allergies: ?No Known Allergies ? ?Social  History: ? reports that he quit smoking about 24 years ago. His smoking use included cigarettes. He has never used smokeless tobacco. He reports that he does not drink alcohol and does not use drugs.  ? ?Family History: ?Family History  ?Problem Relation Age of Onset  ? Congestive Heart Failure Mother   ? Diabetes Father   ? Lung cancer Brother   ? ? ?Review of Systems: ?Review of Systems  ?Constitutional:  Negative for chills and fever.  ?Respiratory:  Negative for shortness of breath.   ?Cardiovascular:  Negative for chest pain.  ?Gastrointestinal:   Positive for abdominal pain. Negative for nausea and vomiting.  ?Skin:   ?     Abscess anterior abdominal wall  ? ?Physical Exam ?BP 125/88   Pulse (!) 101   Temp 97.6 ?F (36.4 ?C) (Oral)   Ht $R'5\' 11"'tQ$  (1.803 m)   Wt 153 lb 9.6 oz (69.7 kg)   SpO2 99%   BMI 21.42 kg/m?  ?CONSTITUTIONAL: No acute distress ?HEENT:  Normocephalic, atraumatic, extraocular motion intact. ?RESPIRATORY:  Normal respiratory effort without pathologic use of accessory muscles. ?CARDIOVASCULAR: Regular rhythm and rate. ?GI: The abdomen is soft, non-distended.  The patient's I&D site has dressing from ED.  Dressing removed, revealing a 2 x 3 cm cavity, with fibrinous material lining the base and edges.  This was sharply debrided down to healthier tissue.  No further purulence noted.  I could not see a direct communication to the track of the G tube through the wound.  The wound was then packed with 1/2 inch iodoform gauze, and covered with dry gauze dressing. ?MUSCULOSKELETAL:  Normal muscle strength and tone in all four extremities.  No peripheral edema or cyanosis. ?NEUROLOGIC:  Motor and sensation is grossly normal.  Cranial nerves are grossly intact. ?PSYCH:  Alert and oriented to person, place and time. Affect is normal. ? ?Laboratory Analysis: ?Labs from 06/20/21: ?Na 138, K 4.5, Cl 100, CO2 32, BUN 36, Cr 1.25. Tbili 1.0, AST 18, ALT 20, Alk Phos 63, Albumin 3.  WBC 5.5, Hgb 9.9, Hct 29.5, Plt 221. ? ?Imaging: ?CT Abdomen/Pelvis 06/20/21: ?IMPRESSION: ?1. 3.2 x 4.1 cm rim enhancing fluid collection in the subcutaneous tissues of the anterior abdominal wall immediately to the right of the gastrostomy tube. Imaging features are compatible with abscess. ?2. Aortic Atherosclerosis (ICD10-I70.0). ? ?Assessment and Plan: ?This is a 76 y.o. male with an abdominal wall abscess, s/p I&D. ? ?--Did bedside debridement of the abscess cavity down to healthy wound tissue.  Packed with 1/2 inch iodoform.  Instructed the patient on doing daily  dressing changes and as needed to keep the area clean and dry, particularly having the feeding tube just next to it.  Continue taking his antibiotic until course completed. ?--Follow up with me in one week to reassess his wound. ? ?I spent 30 minutes dedicated to the care of this patient on the date of this encounter to include pre-visit review of records, face-to-face time with the patient discussing diagnosis and management, and any post-visit coordination of care. ? ? ?Melvyn Neth, MD ?[facility] ? ?  ?

## 2021-07-01 ENCOUNTER — Ambulatory Visit: Payer: Medicare Other | Admitting: Surgery

## 2021-07-04 ENCOUNTER — Ambulatory Visit (INDEPENDENT_AMBULATORY_CARE_PROVIDER_SITE_OTHER): Payer: Medicare Other | Admitting: Surgery

## 2021-07-04 ENCOUNTER — Encounter: Payer: Self-pay | Admitting: Surgery

## 2021-07-04 VITALS — BP 116/76 | HR 98 | Temp 98.3°F | Ht 71.0 in | Wt 157.0 lb

## 2021-07-04 DIAGNOSIS — Z09 Encounter for follow-up examination after completed treatment for conditions other than malignant neoplasm: Secondary | ICD-10-CM

## 2021-07-04 DIAGNOSIS — L02211 Cutaneous abscess of abdominal wall: Secondary | ICD-10-CM

## 2021-07-04 NOTE — Progress Notes (Signed)
07/04/2021 ? ?HPI: ?Travis Arellano is a 76 y.o. male s/p I&D of abdominal wall abscess in the emergency room on 06/20/2021 followed by debridement in office by me on 06/24/2021.  Patient presents today for follow-up.  On his last visit on 06/24/2021, the abscess cavity was debrided further down to clean healthy tissue.  It was packed with half-inch iodoform gauze and instructed the patient on doing daily dressing changes.  However the patient reports that he has not remove the packing since the initial visit as he was not comfortable doing the dressing changes at home.  He denies any worsening pain or drainage from the area.  He has been changing the outer gauze dressing which goes around the G-tube which Overlap also with abscess that he had. ? ?Vital signs: ?BP 116/76   Pulse 98   Temp 98.3 ?F (36.8 ?C)   Ht '5\' 11"'$  (1.803 m)   Wt 157 lb (71.2 kg)   SpO2 97%   BMI 21.90 kg/m?   ? ?Physical Exam: ?Constitutional: No acute distress ?Abdomen: Soft, nondistended, nontender to palpation.  G-tube in place without issues.  Near abscess cavity is healing well with healthy granulation tissue and wound edges without any further evidence of infection or fibrinous material.  The wound was packed instead with a 2 x 2 gauze and instructed patient how to do the dressing changes daily.  He was able to demonstrate packing of the wound. ? ? ?Assessment/Plan: ?This is a 76 y.o. male s/p I&D and debridement of abdominal wall abscess adjacent to his G-tube. ? ?- Instructed the patient doing daily dressing changes with packing using a 2 x 2 gauze and a Q-tip and he demonstrated being able to do this at bedside.  He will change his gauze packing once daily and will apply dry gauze dressing on top as he currently is doing.  If there is any concerns with the wound or if being unable to do dressing changes himself, he will contact us so that we can see him as nurse visits daily dressing changes. ?- Otherwise he will follow-up in 2 weeks  for another wound check. ? ? ?Melvyn Neth, MD ?[facility]  ?

## 2021-07-04 NOTE — Patient Instructions (Addendum)
Once a day remove your gauze, using a Q-tip and push new gauze into your wound and put a clean gauze over this and tape in place.  Keep a dressing over your wound. You may change the top dressing every day also.  ? ?Call us if you are unable to pack your wound and we can have you come in for a nurse visit to do this.  ? ?Follow up here in 2 weeks.  ? ? ?Wound Packing ?Wound packing usually involves placing a moistened packing material into your wound and then covering it with an outer bandage (dressing). This helps support the healing of deep tissue and tissue under the skin. It also helps prevent bleeding, infection, and further injury. ?Wounds are packed until deep tissues heal. The time it takes for this to happen is different for everyone. Your health care provider will show you how to pack and dress your wound. Using gloves and a clean technique is important to avoid spreading germs into your wound. ?Supplies needed: ?Soap and water. ?Disposable gloves. ?Cleansing or wetting solution, such as saline, germ-free (sterile) water, or an antiseptic solution. ?Clean bowl. ?Clean packing material, such as gauze, gauze sponges, or rolled gauze. ?Clean paper towels. ?Outer dressing. This includes the cover dressing and tape, or a dressing with an adhesive border. ?Cotton-tipped swabs. ?Small plastic bag for trash. ?How to pack your wound ?Follow your health care provider's instructions on how often you need to change dressings and pack your wound. You will likely be asked to change your dressings 1 to 2 times a day. ?Preparing to change the wound packing ?If needed, take pain medicine 30 minutes before you pack your wound as told by your health care provider. ?Preparing the new packing material ? ?Clean and disinfect your work surface or countertop. ?Set a plastic bag on or near your work surface. ?Wash your hands with soap and water for at least 20 seconds before you change the dressing. If soap and water are not  available, use hand sanitizer. ?Put a clean paper towel on the counter. ?Put a clean bowl on the towel. Only touch the outside of the bowl when handling it. ?Pour the cleansing or wetting solution that your health care provider tells you to use into the bowl. ?Select and cut your packing material to fit the size of your wound. Avoid using multiple pieces of packing material. Drop it into the bowl. ?Cut tape strips that you will use to seal the outer dressing, if needed. ?Put gauze pads for cleansing and cotton-tipped swabs on the clean paper towel. ?Removing the old packing material and dressing ?Put on a set of gloves. ?Gently remove the old dressing and packing material. Make sure to check how the drainage looks or if there is any odor. ?Clean or rinse (irrigate) the wound. ?Remove your gloves. ?Put the removed items, including gloves, into the plastic bag to throw away later. ?Wash your hands again with soap and water for at least 20 seconds. If soap and water are not available, use hand sanitizer. ?Applying the new packing material and dressing ? ?Put on a new set of gloves. ?Squeeze the packing material in the bowl to release the extra liquid. The packing material should be moist, but not dripping wet. ?Gently place the packing material into the wound. Use a cotton-tipped swab to guide it into place, filling all of the space. Do not overpack the wound bed. ?Dry your gloved fingertips on the paper towel. ?Open up your  outer dressing supplies and put them on a dry part of the paper towel. Keep them from getting wet. ?Place the outer dressing over the packed wound. ?Tape the edges of the outer dressing in place. ?Remove your gloves. ?Wash your hands again with soap and water for at least 20 seconds. If soap and water are not available, use hand sanitizer. ?Put the removed items, including gloves, into the plastic bag to throw away. ?Clean and disinfect your work surface or countertop. ?General tips ?Follow your  health care provider's instructions on how much to pack the wound. At first, you may need to pack it more fully to help stop bleeding. As the wound begins to heal inside, you will use less packing material and pack the wound loosely to allow the tissue to heal slowly from the inside out. ?Do not take baths, swim, or use a hot tub until your health care provider approves. Ask your health care provider if you may take showers. You may only be allowed to take sponge baths. ?Keep the dressing clean and dry. ?Follow any other instructions given by your health care provider on how to aid healing. This may include applying warm or cold compresses, raising (elevating) the affected area, or wearing a compression dressing. ?Check your wound site every day for signs of infection. Check for: ?More redness, swelling, or pain. ?More fluid or blood. ?Warmth or hardness (induration). ?Pus or a bad smell. ?Protect your wound from the sun when you are outside for the first 6 months, or for as long as told by your health care provider. Cover up the scar area or apply sunscreen that has an SPF of at least 55. ?Keep all follow-up visits. This is important. ?Contact a health care provider if: ?Your pain is not controlled with pain medicine. ?You have more drainage, redness, swelling, or pain at your wound site. ?You have new rash, warmth, or induration around the wound. ?You have a fever or chills. ?Your wound becomes larger or deeper. ?Get help right away if: ?The tissue inside your wound changes color from pink to white, yellow, or black. ?You notice a bad smell or pus coming from the wound site. ?You are having trouble packing your wound. ?Your wound is bleeding, and the bleeding does not stop with gentle pressure. ?These symptoms may represent a serious problem that is an emergency. Do not wait to see if the symptoms will go away. Get medical help right away. Call your local emergency services (911 in the [place] Do not drive yourself  to the hospital. ?Summary ?Wound packing usually involves placing a moistened packing material into your wound and then covering it with an outer bandage (dressing). ?Follow your health care provider's instructions on how often you need to change dressings and pack your wound. You will likely be asked to change dressings 1 to 2 times a day. ?When packing your wound, it is important to use gloves to avoid spreading germs into the wound. ?Check your wound site every day for signs of infection. ?This information is not intended to replace advice given to you by your health care provider. Make sure you discuss any questions you have with your health care provider. ?Document Revised: 07/06/2020 Document Reviewed: 07/06/2020 ?Elsevier Patient Education ? [place]. ? ? ? ?

## 2021-07-05 ENCOUNTER — Other Ambulatory Visit: Payer: Self-pay | Admitting: Hospice and Palliative Medicine

## 2021-07-05 ENCOUNTER — Ambulatory Visit: Payer: Medicare Other | Attending: Hospice and Palliative Medicine | Admitting: Speech Pathology

## 2021-07-05 DIAGNOSIS — C12 Malignant neoplasm of pyriform sinus: Secondary | ICD-10-CM

## 2021-07-05 DIAGNOSIS — R1310 Dysphagia, unspecified: Secondary | ICD-10-CM | POA: Insufficient documentation

## 2021-07-05 DIAGNOSIS — R1312 Dysphagia, oropharyngeal phase: Secondary | ICD-10-CM | POA: Diagnosis present

## 2021-07-06 ENCOUNTER — Encounter: Payer: Self-pay | Admitting: Internal Medicine

## 2021-07-06 ENCOUNTER — Inpatient Hospital Stay: Payer: Medicare Other

## 2021-07-06 ENCOUNTER — Inpatient Hospital Stay (HOSPITAL_BASED_OUTPATIENT_CLINIC_OR_DEPARTMENT_OTHER): Payer: Medicare Other | Admitting: Internal Medicine

## 2021-07-06 VITALS — BP 113/80 | HR 97 | Temp 97.0°F | Resp 16 | Ht 71.0 in | Wt 157.0 lb

## 2021-07-06 DIAGNOSIS — Z95828 Presence of other vascular implants and grafts: Secondary | ICD-10-CM

## 2021-07-06 DIAGNOSIS — C12 Malignant neoplasm of pyriform sinus: Secondary | ICD-10-CM

## 2021-07-06 LAB — CBC WITH DIFFERENTIAL/PLATELET
Abs Immature Granulocytes: 0.02 10*3/uL (ref 0.00–0.07)
Basophils Absolute: 0 10*3/uL (ref 0.0–0.1)
Basophils Relative: 0 %
Eosinophils Absolute: 0 10*3/uL (ref 0.0–0.5)
Eosinophils Relative: 1 %
HCT: 30.4 % — ABNORMAL LOW (ref 39.0–52.0)
Hemoglobin: 10.3 g/dL — ABNORMAL LOW (ref 13.0–17.0)
Immature Granulocytes: 0 %
Lymphocytes Relative: 10 %
Lymphs Abs: 0.6 10*3/uL — ABNORMAL LOW (ref 0.7–4.0)
MCH: 32.5 pg (ref 26.0–34.0)
MCHC: 33.9 g/dL (ref 30.0–36.0)
MCV: 95.9 fL (ref 80.0–100.0)
Monocytes Absolute: 0.5 10*3/uL (ref 0.1–1.0)
Monocytes Relative: 10 %
Neutro Abs: 4.5 10*3/uL (ref 1.7–7.7)
Neutrophils Relative %: 79 %
Platelets: 161 10*3/uL (ref 150–400)
RBC: 3.17 MIL/uL — ABNORMAL LOW (ref 4.22–5.81)
RDW: 18.3 % — ABNORMAL HIGH (ref 11.5–15.5)
WBC: 5.7 10*3/uL (ref 4.0–10.5)
nRBC: 0 % (ref 0.0–0.2)

## 2021-07-06 LAB — COMPREHENSIVE METABOLIC PANEL
ALT: 22 U/L (ref 0–44)
AST: 24 U/L (ref 15–41)
Albumin: 3.3 g/dL — ABNORMAL LOW (ref 3.5–5.0)
Alkaline Phosphatase: 64 U/L (ref 38–126)
Anion gap: 7 (ref 5–15)
BUN: 25 mg/dL — ABNORMAL HIGH (ref 8–23)
CO2: 26 mmol/L (ref 22–32)
Calcium: 8.8 mg/dL — ABNORMAL LOW (ref 8.9–10.3)
Chloride: 100 mmol/L (ref 98–111)
Creatinine, Ser: 1.12 mg/dL (ref 0.61–1.24)
GFR, Estimated: >60 mL/min (ref >60)
Glucose, Bld: 118 mg/dL — ABNORMAL HIGH (ref 70–99)
Potassium: 3.9 mmol/L (ref 3.5–5.1)
Sodium: 133 mmol/L — ABNORMAL LOW (ref 135–145)
Total Bilirubin: 0.8 mg/dL (ref 0.3–1.2)
Total Protein: 6.3 g/dL — ABNORMAL LOW (ref 6.5–8.1)

## 2021-07-06 LAB — MAGNESIUM: Magnesium: 1.9 mg/dL (ref 1.7–2.4)

## 2021-07-06 MED ORDER — SODIUM CHLORIDE 0.9% FLUSH
10.0000 mL | Freq: Once | INTRAVENOUS | Status: AC
  Administered 2021-07-06: 10 mL via INTRAVENOUS
  Filled 2021-07-06: qty 10

## 2021-07-06 MED ORDER — HEPARIN SOD (PORK) LOCK FLUSH 100 UNIT/ML IV SOLN
500.0000 [IU] | Freq: Once | INTRAVENOUS | Status: AC
  Administered 2021-07-06: 500 [IU] via INTRAVENOUS
  Filled 2021-07-06: qty 5

## 2021-07-06 MED ORDER — SODIUM CHLORIDE 0.9 % IV SOLN
INTRAVENOUS | Status: AC
  Filled 2021-07-06 (×2): qty 250

## 2021-07-06 NOTE — Progress Notes (Signed)
[place] ?CONSULT NOTE ? ?Patient Care Team: ?[facility] as PCP - General ?Cammie Sickle, MD as Consulting Physician (Oncology) ?Noreene Filbert, MD as Consulting Physician (Radiation Oncology) ?Clyde Canterbury, MD as Referring Physician (Otolaryngology) ? ?CHIEF COMPLAINTS/PURPOSE OF CONSULTATION: head and neck cancer ? ?#  ?Oncology History Overview Note  ?DIAGNOSIS:  ?A. PIRIFORM SINUS LESION, LEFT; BIOPSY:  ?- INVASIVE SQUAMOUS CELL CARCINOMA, KERATINIZING.  ? ?Asymmetric soft tissue thickening and enhancement of the left ?aryepiglottic fold and adjacent paraglottic fat suspicious for ?malignancy. Abnormal right level 1/2 and left level 2 likely ?cystic/necrotic nodes. ?  ?A subcentimeter low-density lesion of the right vallecula probably ?reflects a cyst but direct visual inspection is recommended. ? ?# JAN 2023-Pyriform sinus- SCC-[Dr.Bennett]clinically T1 [~1cm; N2a]; PET scan Jan 19th, 2023- The mucosal thickening along the left piriform sinus is highlyHypermetabolic'  The contralateral right level IIa lymph node is mildly hypermetabolic along its more solid-appearing anterior inferior border, maximum SUV 4.0. The left level IIa lymph node has activity less than blood pool, although its partially cystic appearance was still unusual and suspicious.  ? ?# FEB 6th, 2023- Cisplatin-RT ?  ? ?  ?  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  ?04/18/2021 -  Chemotherapy  ? Patient is on Treatment Plan : HEAD/NECK Cisplatin q7d  ? ?  ?  ? Cancer Staging  ? Cancer Staging  ?No matching staging information was found for the patient. ? ?  ?04/18/2021 Cancer Staging  ? Staging form: Pharynx - Hypopharynx, AJCC 8th Edition ?- Clinical: Stage IVA (cT1, cN2c, cM0) - Signed by Cammie Sickle, MD on 04/18/2021 ? ?  ? ? ? ?HISTORY OF PRESENTING ILLNESS: Ambulating independently.  Alone. ?Travis Arellano 76 y.o.  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 ? ?In the interim patient was evaluated by surgery for abdominal wall abscess around the PEG tube s/p I&D.  Patient states wound is healing well. ? ?He continues to have difficulty swallowing-because of thick mucus.  Also complains of pain however not taking any pain medications. ? ?He continues feeding through the PEG tube.  Overall weight is stable.  ? ?No nausea or vomiting. ? ?Review of Systems  ?Constitutional:  Positive for malaise/fatigue and weight loss. Negative for chills, diaphoresis and fever.  ?HENT:  Negative for nosebleeds and sore throat.   ?Eyes:  Negative for double vision.  ?Respiratory:  Negative for cough, hemoptysis, sputum production, shortness of breath and wheezing.   ?Cardiovascular:  Negative for chest pain, palpitations, orthopnea and leg swelling.  ?Gastrointestinal:  Negative for abdominal pain, blood in stool, constipation, diarrhea, heartburn, melena, nausea and vomiting.  ?Genitourinary:  Negative for dysuria, frequency and urgency.  ?Musculoskeletal:  Positive for back pain and joint pain.  ?Skin: Negative.  Negative for itching and rash.  ?Neurological:  Negative for dizziness, tingling, focal weakness, weakness and headaches.  ?Endo/Heme/Allergies:  Does not bruise/bleed easily.  ?Psychiatric/Behavioral:  Negative for depression. The patient is not nervous/anxious and does not have insomnia.    ? ?MEDICAL HISTORY:  ?Past Medical History:  ?Diagnosis Date  ? 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 High cholesterol   ? Hypertension   ? Wears dentures   ? Full upper, partial lower  ? ? ?SURGICAL HISTORY: ?Past Surgical History:  ?Procedure Laterality Date  ? CATARACT EXTRACTION W/PHACO Right 05/17/2020  ?  Procedure: CATARACT EXTRACTION PHACO AND INTRAOCULAR LENS PLACEMENT (IOC);  Surgeon: Baruch Goldmann, MD;  Location: [facility];  Service: Ophthalmology;  Laterality: Right;  CDE: 15.03   ? CATARACT EXTRACTION W/PHACO Left 06/11/2020  ? Procedure: CATARACT EXTRACTION PHACO AND INTRAOCULAR LENS PLACEMENT (IOC);  Surgeon: Baruch Goldmann, MD;  Location: [facility];  Service: Ophthalmology;  Laterality: Left;  CDE: 13.98  ? COLONOSCOPY    ? IR GASTROSTOMY TUBE MOD SED  05/20/2021  ? IR IMAGING GUIDED PORT INSERTION  04/05/2021  ? MICROLARYNGOSCOPY N/A 03/22/2021  ? Procedure: MICROSCOPIC DIRECT LARYNGOSCOPY WITH BIOPSY;  Surgeon: Clyde Canterbury, MD;  Location: [facility];  Service: ENT;  Laterality: N/A;  ? RIGID ESOPHAGOSCOPY N/A 03/22/2021  ? Procedure: RIGID ESOPHAGOSCOPY;  Surgeon: Clyde Canterbury, MD;  Location: [facility];  Service: ENT;  Laterality: N/A;  ? ? ?SOCIAL HISTORY: ?Social History  ? ?Socioeconomic History  ? Marital status: Divorced  ?  Spouse name: Not on file  ? Number of children: Not on file  ? Years of education: Not on file  ? Highest education level: Not on file  ?Occupational History  ? Not on file  ?Tobacco Use  ? Smoking status: Former  ?  Types: Cigarettes  ?  Quit date: 1999  ?  Years since quitting: 24.3  ? Smokeless tobacco: Never  ?Vaping Use  ? Vaping Use: Never used  ?Substance and Sexual Activity  ? Alcohol use: No  ? Drug use: No  ? Sexual activity: Not on file  ?Other Topics Concern  ? Not on file  ?Social History Narrative  ? Lives between [place] [[address] [place] [address]]; with son and brother. Quit smoking in 1999 [prior all life]; quit alcohol 30 years- used to drink beer. Retd/ Part time- auto-mechanic work.   ? ?Social Determinants of Health  ? ?Financial Resource Strain: Not on file  ?Food Insecurity: Not on file  ?Transportation Needs: Not on file  ?Physical Activity: Not on file  ?Stress: Not on file  ?Social Connections: Not on file  ?Intimate Partner Violence: Not on file  ? ? ?FAMILY HISTORY: ?Family History  ?Problem Relation Age of Onset  ? Congestive Heart Failure Mother   ? Diabetes Father   ? Lung cancer Brother   ? ? ?ALLERGIES:  has No  Known Allergies. ? ?MEDICATIONS:  ?Current Outpatient Medications  ?Medication Sig Dispense Refill  ? Nutritional Supplements (ISOSOURCE 1.5 CAL) LIQD Give 2 cartons at 8am. Give 1 carton at 11am, 2pm, 5pm and 8pm. Flush with 120 ml of water before and after each feeding. Send bolus tube feeding supplies.  [place] RN starting on 05/23/21 to provide support for TF management in the home.  Start with 2 cartons daily and then add 1 carton each day until at goal rate. 1659 mL 0  ? polyethylene glycol powder (MIRALAX) 17 GM/SCOOP powder Take 2 cap fulls 1-2 times daily as needed. 255 g 2  ? senna (SENOKOT) 8.6 MG TABS tablet Take 2 tablets 1-2 times daily. 120 tablet 2  ? potassium chloride 20 MEQ/15ML (10%) SOLN Take 15 mLs (20 mEq total) by mouth daily. (Patient not taking: Reported on 07/04/2021) 473 mL 0  ? ?No current facility-administered medications for this visit.  ? ?Facility-Administered Medications Ordered in Other Visits  ?Medication Dose Route Frequency Provider Last Rate Last Admin  ? heparin lock flush 100 UNIT/ML injection           ? ? ? ?PHYSICAL EXAMINATION: ?ECOG PERFORMANCE STATUS:  1 - Symptomatic but completely ambulatory ? ?Vitals:  ? 07/06/21 0914  ?BP: 113/80  ?Pulse: 97  ?Resp: 16  ?Temp: (!) 97 ?F (36.1 ?C)  ?SpO2: 100%  ? ?Filed Weights  ? 07/06/21 0914  ?Weight: 157 lb (71.2 kg)  ? ? ?Physical Exam ?Vitals and nursing note reviewed.  ?HENT:  ?   Head: Normocephalic and atraumatic.  ?   Mouth/Throat:  ?   Pharynx: Oropharynx is clear.  ?Eyes:  ?   Extraocular Movements: Extraocular movements intact.  ?   Pupils: Pupils are equal, round, and reactive to light.  ?Cardiovascular:  ?   Rate and Rhythm: Normal rate and regular rhythm.  ?Pulmonary:  ?   Comments: Decreased breath sounds bilaterally.  ?Abdominal:  ?   Palpations: Abdomen is soft.  ?Musculoskeletal:     ?   General: Normal range of motion.  ?   Cervical back: Normal range of motion.  ?Skin: ?   General: Skin is warm.  ?Neurological:  ?    General: No focal deficit present.  ?   Mental Status: He is alert and oriented to person, place, and time.  ?Psychiatric:     ?   Behavior: Behavior normal.     ?   Judgment: Judgment normal.  ? ? ? ?

## 2021-07-06 NOTE — Therapy (Signed)
[facility] ?[facility] ?[place], [place], [address] ?Phone: [phone]   Fax:  [phone] ? ?Speech Language Pathology Treatment ?RE-CERTIFICATION REQUEST ? ?Patient Details  ?Name: Travis Arellano ?MRN: [id number] ?Date of Birth: 1945/11/19 ?Referring Provider (SLP): Altha Harm ? ? ?Encounter Date: 07/05/2021 ? ? End of Session - 07/06/21 1906   ? ? Visit Number 3   ? Number of Visits 13   ? Date for SLP Re-Evaluation 01/05/22   ? Authorization Type [employer]; [employer]   ? Authorization Time Period 06/26/2021 thru 01/05/2022   ? Authorization - Visit Number 3   ? Progress Note Due on Visit 10   ? SLP Start Time 1100   ? SLP Stop Time  1200   ? SLP Time Calculation (min) 60 min   ? Activity Tolerance Patient tolerated treatment well   ? ?  ?  ? ?  ? ? ?Past Medical History:  ?Diagnosis Date  ? 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 High cholesterol   ? Hypertension   ? Wears dentures   ? Full upper, partial lower  ? ? ?Past Surgical History:  ?Procedure Laterality Date  ? CATARACT EXTRACTION W/PHACO Right 05/17/2020  ? Procedure: CATARACT EXTRACTION PHACO AND INTRAOCULAR LENS PLACEMENT (IOC);  Surgeon: Baruch Goldmann, MD;  [facility];  Service: Ophthalmology;  Laterality: Right;  CDE: 15.03  ? CATARACT EXTRACTION W/PHACO Left 06/11/2020  ? Procedure: CATARACT EXTRACTION PHACO AND INTRAOCULAR LENS PLACEMENT (IOC);  Surgeon: Baruch Goldmann, MD;  Location[facility];  Service: Ophthalmology;  Laterality: Left;  CDE: 13.98  ? COLONOSCOPY    ? IR GASTROSTOMY TUBE MOD SED  05/20/2021  ? IR IMAGING GUIDED PORT INSERTION  04/05/2021  ? MICROLARYNGOSCOPY N/A 03/22/2021  ? Procedure: MICROSCOPIC DIRECT LARYNGOSCOPY WITH BIOPSY;  Surgeon: Clyde Canterbury, MD;  Location: [facility];  Service: ENT;  Laterality: N/A;  ? RIGID ESOPHAGOSCOPY N/A 03/22/2021  ? Procedure: RIGID ESOPHAGOSCOPY;  Surgeon: Clyde Canterbury, MD;  Location: [facility];   Service: ENT;  Laterality: N/A;  ? ? ?There were no vitals filed for this visit. ? ? Subjective Assessment - 07/06/21 1905   ? ? Subjective pt pleasant, reports improved pharyngeal pain, no longer dysphonic   ? Currently in Pain? No/denies   ? ?  ?  ? ?  ? ? ? ? ? ? ? ? ADULT SLP TREATMENT - 07/06/21 0001   ? ?  ? Treatment Provided  ? Treatment provided Dysphagia   ? ?  ?  ? ?  ? ? ? SLP Education - 07/06/21 1906   ? ? Education Details RFS, Modified Barium Swallow Study   ? Person(s) Educated Patient   ? Methods Explanation;Demonstration;Verbal cues;Handout   ? Comprehension Verbalized understanding;Need further instruction   ? ?  ?  ? ?  ? ? ? SLP Short Term Goals - 07/06/21 1918   ? ?  ? SLP SHORT TERM GOAL #1  ? Title Pt will demonstrate understanding of current medical condition by answering [facility] qeustions with > 90% accuracy.   ? Status On-going   ?  ? SLP SHORT TERM GOAL #2  ? Title Pt will demonstrate understanding of s/s of aspiration by answering [facility] questions with > 90% accuracy.   ? Status On-going   ? ?  ?  ? ?  ? ? ? SLP Long Term Goals - 07/06/21 1918   ? ?  ? SLP  LONG TERM GOAL #1  ? Title Pt will reduce risk of aspiration by demonstrating understanding of aspiration precautions/diet modification.   ? Time 6   ? Period Months   ? Status On-going   ? Target Date 01/05/22   ? ?  ?  ? ?  ? ? ? Plan - 07/06/21 1907   ? ? Clinical Impression Statement Pt presents with improved vocal quality and overall reduced pharyngeal pain when swallowing. He reports that when swallowing food items, it feels that he needs to apply force to "get it down." When consuming thin liquids, he didn't display any overt s/s of aspiration but recommend a Modified Barium Swallow Study at 6 weeks post radiation to rule out silent aspiration and to establish baseline pharyngeal ability. SLP further facilitated session by providng skilled information about RFS and ways that radiation can impact swallow function. Skilled ST intervention  continues to be required to manage radiation related oropharyngeal dysphagia,   ? Speech Therapy Frequency Monthly   ? Duration Other (comment)   6 months  ? Treatment/Interventions Aspiration precaution training;Pharyngeal strengthening exercises;SLP instruction and feedback;Patient/family education   ? Potential to Achieve Goals Good   ? Consulted and Agree with Plan of Care Patient   ? ?  ?  ? ?  ? ? ?Patient will benefit from skilled therapeutic intervention in order to improve the following deficits and impairments:   ?Dysphagia, oropharyngeal phase ? 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 ?Odynophagia ? ? ? ?Problem List ?Patient Active Problem List  ? Diagnosis Date Noted  ? Atopic dermatitis 06/05/2021  ? Benign neoplasm of colon 06/05/2021  ? Benign prostatic hyperplasia without lower urinary tract symptoms 06/05/2021  ? Disorder of the skin and subcutaneous tissue, unspecified 06/05/2021  ? Hyperglycemia 06/05/2021  ? Mixed hyperlipidemia 06/05/2021  ? 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 Essential hypertension 10/02/2011  ? Hyperlipidemia 10/02/2011  ? ?Malynda Smolinski B. Rutherford Nail, M.S., CCC-SLP, CBIS ?Speech-Language Pathologist ?Rehabilitation Services ?Office [phone] ? ?[place], [place] ?07/06/2021, 7:20 PM ? ?[place] ?[facility] ?[place], [place], [address] ?Phone: [phone]   Fax:  [phone] ? ? ?Name: Travis Arellano ?MRN: [id number] ?Date of Birth: [birth year] ? ?

## 2021-07-06 NOTE — Assessment & Plan Note (Addendum)
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 The left level IIa lymph node has activity less than blood pool, although its partially cystic appearance was still unusual and suspicious. # s/p  cisplatin weekly along with radiation [2/06-3/31]. ? ?# partial response noted; recommend follow up with Dr.Bennett in 3-4 weeks for re-evaluation. Will plan repeating PET scan in mid June 2023 [appx 10 weeks post Rx.];  I sent a message to Dr. Richardson Landry. ? ?# currently Patient experiencing chemoradiation as expected side effects of weight loss sore throat; electrolyte imbalance see discussion below-  ? ?# Electrolyte imbalance:hypokalemia/hypomagnesemia-improved.-Monitor closely. ? 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 ? ?#Nutrition-s/p PEG tube placement continue tube feeds.  Discussed with nutrition. ? ?#Abdominal abscess-around PEG tube; s/p I&D improved.  Continue monitoring with surgery. ? ?# IV access/Mediport-s/p mediport- ? ?# DISPOSITION: ?#  Weekly- labs- cbc/bmp; mag- 1 Lits IVFs over 1 hour ?# follow up in 3 weeks- MD: labs- cbc/cmp/mag;-; possible IV KCL 20 meq/2 gm Mag; IVFs over 1 hour-Dr.B ? ? ?

## 2021-07-06 NOTE — Progress Notes (Addendum)
Nutrition Follow-up: ? ? ?Patient with stage IV pyriform sinus cancer.  Patient has completed concurrent chemotherapy and radiation.  PEG placed on 3/10 (18 Fr). ? ?Met with patient following MD visit.  Patient receiving IV fluids today.  Says that he is giving 6 cartons of isosource 1.5 maybe 3 times per week, most of the time giving 4-5 cartons per day.  Noted abscess near PEG tube site debrided by surgeon. Surgery notes reviewed.  Patient flushing with 118ml of water before and after each feeding. Denies nausea. Says that he is constipated some.  ? ?Says that he is not eating much by mouth due to no taste and throat is still a little sore.  Did say that he ate an egg sandwich the other day.  Does not like oral nutrition supplement shakes.   ? ? ? ?Medications: reviewed ? ?Labs: Na 133, glucose 118, Calcium 8.8, BUN 25, creatinine 1.12 ? ?Anthropometrics:  ? ?Weight 157 lb today ? ?163 lb on 3/24 ?166 lb on 3/13 ?171 lb on 3/6 ?176 lb on 2/27 ?182 lb on 2/13 ?186 lb on 2/6 ?194 lb on 1/27 ? ? ?Estimated Energy Needs ? ?Kcals: 2563-8937 ?Protein: 115-135 g ?Fluid: 2300-2727ml ? ?NUTRITION DIAGNOSIS: Inadequate oral intake continues, relying on feeding tube ? ? ?INTERVENTION:  ?Patient to give 5-6 cartons of isosource 1.5 daily via feeding tube.  Flush with 129ml of water before and after each feeding.   ?Discussed increasing hydration orally vs via feeding tube. ?Discussed importance of eating at least 1 soft food at meal time (breakfast, lunch and dinner).  Handout on soft, moist foods given to patient.   ?  ? ?MONITORING, EVALUATION, GOAL: weight trends, intake, tube feeding ? ? ?NEXT VISIT: Thursday, May 18 ? ?Rosha Cocker [name], RD, LDN ?Registered Dietitian ?[phone] ? ? ?

## 2021-07-08 ENCOUNTER — Ambulatory Visit
Admission: RE | Admit: 2021-07-08 | Discharge: 2021-07-08 | Disposition: A | Discharge status: Home / Self Care | Payer: Medicare Other | Source: Ambulatory Visit | Attending: Radiation Oncology | Admitting: Radiation Oncology

## 2021-07-08 VITALS — BP 117/81 | HR 91 | Temp 97.2°F | Resp 18 | Ht 71.0 in | Wt 159.6 lb

## 2021-07-08 DIAGNOSIS — C12 Malignant neoplasm of pyriform sinus: Secondary | ICD-10-CM | POA: Diagnosis present

## 2021-07-08 DIAGNOSIS — Z923 Personal history of irradiation: Secondary | ICD-10-CM | POA: Diagnosis not present

## 2021-07-08 DIAGNOSIS — C329 Malignant neoplasm of larynx, unspecified: Secondary | ICD-10-CM

## 2021-07-08 MED ORDER — SUCRALFATE 1 G PO TABS: 1.0000 g | ORAL_TABLET | Freq: Three times a day (TID) | ORAL | 6 refills | Status: DC

## 2021-07-08 MED ORDER — SUCRALFATE 1 GM/10ML PO SUSP: 1.0000 g | Freq: Three times a day (TID) | ORAL | 6 refills | Status: DC

## 2021-07-08 NOTE — Progress Notes (Signed)
Radiation Oncology ?Follow up Note ? ?Name: Travis Arellano   ?Date:   07/08/2021 ?MRN:  [id number] ?DOB: 11-Nov-1945  ? ? ?This 76 y.o. 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 ?REFERRING PROVIDER: [facility]* ? ?HPI: Patient is a 76 year old male now at 1 month having completed concurrent chemoradiation therapy for stage IVa squamous of carcinoma the piriform sinus.  Seen today in routine follow-up he is doing fairly well still complains of some dysphagia.Marland Kitchen  He is having no head and neck pain.  He has not yet seen ENT.  He has a PET scan scheduled for June ? ?COMPLICATIONS OF TREATMENT: none ? ?FOLLOW UP COMPLIANCE: keeps appointments  ? ?PHYSICAL EXAM:  ?BP 117/81   Pulse 91   Temp (!) 97.2 ?F (36.2 ?C)   Resp 18   Ht '5\' 11"'$  (1.803 m)   Wt 159 lb 9.6 oz (72.4 kg)   BMI 22.26 kg/m?  ?Neck is clear without evidence of cervical or supraclavicular adenopathy.  Well-developed well-nourished patient in NAD. HEENT reveals PERLA, EOMI, discs not visualized.  Oral cavity is clear. No oral mucosal lesions are identified. Neck is clear without evidence of cervical or supraclavicular adenopathy. Lungs are clear to A&P. Cardiac examination is essentially unremarkable with regular rate and rhythm without murmur rub or thrill. Abdomen is benign with no organomegaly or masses noted. Motor sensory and DTR levels are equal and symmetric in the upper and lower extremities. Cranial nerves II through XII are grossly intact. Proprioception is intact. No peripheral adenopathy or edema is identified. No motor or sensory levels are noted. Crude visual fields are within normal range. ? ?RADIOLOGY RESULTS: No current films for review ? ?PLAN: Present time patient slowly recovering from combined modality treatment.  I have sent we will start him on Carafate rinses at least twice a day.  I have  asked to see him back in 3 to 4 months for follow-up.  He will have a PET CT scan in June.  I have also asked him to contact Dr. Reola Mosher office to continue close follow-up care. ? ?I would like to take this opportunity to thank you for allowing me to participate in the care of your patient.. ?  ? Noreene Filbert, MD ? ?

## 2021-07-12 ENCOUNTER — Encounter: Payer: Medicare Other | Admitting: Speech Pathology

## 2021-07-18 ENCOUNTER — Ambulatory Visit (INDEPENDENT_AMBULATORY_CARE_PROVIDER_SITE_OTHER): Payer: Medicare Other | Admitting: Surgery

## 2021-07-18 ENCOUNTER — Encounter: Payer: Self-pay | Admitting: Surgery

## 2021-07-18 VITALS — BP 123/75 | HR 90 | Temp 97.7°F | Ht 71.0 in | Wt 155.0 lb

## 2021-07-18 DIAGNOSIS — L02211 Cutaneous abscess of abdominal wall: Secondary | ICD-10-CM | POA: Diagnosis not present

## 2021-07-18 DIAGNOSIS — Z09 Encounter for follow-up examination after completed treatment for conditions other than malignant neoplasm: Secondary | ICD-10-CM

## 2021-07-18 NOTE — Progress Notes (Signed)
07/18/2021 ? ?HPI: ?Travis Arellano is a 76 y.o. male s/p I&D of cutaneous abscess followed by debridement of the abscess cavity.  He presents today for follow up.  He has been doing dressing changes as instructed and has noted that the wound is getting a lot smaller.  Denies any worsening pain. ? ?Vital signs: ?BP 123/75   Pulse 90   Temp 97.7 ?F (36.5 ?C) (Oral)   Ht '5\' 11"'$  (1.803 m)   Wt 155 lb (70.3 kg)   SpO2 99%   BMI 21.62 kg/m?   ? ?Physical Exam: ?Constitutional: No acute distress ?Abdomen:  I&D site next to G tube is healing well, measuring about 5 mm diameter, very shallow, with some exposed subcutaneous tissue still.  Applied silver nitrate and dry gauze dressing. ? ?Assessment/Plan: ?This is a 76 y.o. male s/p I&D and debridement of cutaneous abscess ? ?--Patient is doing well and the wound is much smaller now, without any room for packing.  Applied silver nitrate and instructed on dry gauze dressing changes going forward. ?--Follow up as needed. ? ? ?Melvyn Neth, MD ?[facility]  ?

## 2021-07-18 NOTE — Patient Instructions (Signed)
If you have any concerns or questions, please feel free to call our office. Follow up as needed.  ? ?Skin Abscess ? ?A skin abscess is an infected area of your skin that contains pus and other material. An abscess can happen in any part of your body. Some abscesses break open (rupture) on their own. Most continue to get worse unless they are treated. The infection can spread deeper into the body and into your blood, which can make you feel sick. ?A skin abscess is caused by germs that enter the skin through a cut or scrape. It can also be caused by blocked oil and sweat glands or infected hair follicles. ?This condition is usually treated by: ?Draining the pus. ?Taking antibiotic medicines. ?Placing a warm, wet washcloth over the abscess. ?Follow these instructions at home: ?Medicines ? ?Take over-the-counter and prescription medicines only as told by your doctor. ?If you were prescribed an antibiotic medicine, take it as told by your doctor. Do not stop taking the antibiotic even if you start to feel better. ?Abscess care ? ?If you have an abscess that has not drained, place a warm, clean, wet washcloth over the abscess several times a day. Do this as told by your doctor. ?Follow instructions from your doctor about how to take care of your abscess. Make sure you: ?Cover the abscess with a bandage (dressing). ?Change your bandage or gauze as told by your doctor. ?Wash your hands with soap and water before you change the bandage or gauze. If you cannot use soap and water, use hand sanitizer. ?Check your abscess every day for signs that the infection is getting worse. Check for: ?More redness, swelling, or pain. ?More fluid or blood. ?Warmth. ?More pus or a bad smell. ?General instructions ?To avoid spreading the infection: ?Do not share personal care items, towels, or hot tubs with others. ?Avoid making skin-to-skin contact with other people. ?Keep all follow-up visits as told by your doctor. This is  important. ?Contact a doctor if: ?You have more redness, swelling, or pain around your abscess. ?You have more fluid or blood coming from your abscess. ?Your abscess feels warm when you touch it. ?You have more pus or a bad smell coming from your abscess. ?Your muscles ache. ?You feel sick. ?Get help right away if: ?You have very bad (severe) pain. ?You see red streaks on your skin spreading away from the abscess. ?You see redness that spreads quickly. ?You have a fever or chills. ?Summary ?A skin abscess is an infected area of your skin that contains pus and other material. ?The abscess is caused by germs that enter the skin through a cut or scrape. It can also be caused by blocked oil and sweat glands or infected hair follicles. ?Follow your doctor's instructions on caring for your abscess, taking medicines, preventing infections, and keeping follow-up visits. ?This information is not intended to replace advice given to you by your health care provider. Make sure you discuss any questions you have with your health care provider. ?Document Revised: 12/06/2020 Document Reviewed: 12/06/2020 ?Elsevier Patient Education ? [place]. ? ?

## 2021-07-22 ENCOUNTER — Ambulatory Visit
Admission: RE | Admit: 2021-07-22 | Discharge: 2021-07-22 | Disposition: A | Discharge status: Home / Self Care | Payer: Medicare Other | Source: Ambulatory Visit | Attending: Hospice and Palliative Medicine | Admitting: Hospice and Palliative Medicine

## 2021-07-22 DIAGNOSIS — C12 Malignant neoplasm of pyriform sinus: Secondary | ICD-10-CM | POA: Diagnosis present

## 2021-07-28 ENCOUNTER — Inpatient Hospital Stay: Payer: Medicare Other | Attending: Internal Medicine

## 2021-07-28 ENCOUNTER — Inpatient Hospital Stay: Payer: Medicare Other

## 2021-07-28 ENCOUNTER — Encounter: Payer: Self-pay | Admitting: Internal Medicine

## 2021-07-28 ENCOUNTER — Inpatient Hospital Stay (HOSPITAL_BASED_OUTPATIENT_CLINIC_OR_DEPARTMENT_OTHER): Payer: Medicare Other | Admitting: Internal Medicine

## 2021-07-28 DIAGNOSIS — Y842 Radiological procedure and radiotherapy as the cause of abnormal reaction of the patient, or of later complication, without mention of misadventure at the time of the procedure: Secondary | ICD-10-CM | POA: Diagnosis not present

## 2021-07-28 DIAGNOSIS — L02211 Cutaneous abscess of abdominal wall: Secondary | ICD-10-CM | POA: Diagnosis not present

## 2021-07-28 DIAGNOSIS — C12 Malignant neoplasm of pyriform sinus: Secondary | ICD-10-CM | POA: Insufficient documentation

## 2021-07-28 DIAGNOSIS — Z87891 Personal history of nicotine dependence: Secondary | ICD-10-CM | POA: Insufficient documentation

## 2021-07-28 DIAGNOSIS — I10 Essential (primary) hypertension: Secondary | ICD-10-CM | POA: Diagnosis not present

## 2021-07-28 DIAGNOSIS — K1233 Oral mucositis (ulcerative) due to radiation: Secondary | ICD-10-CM | POA: Insufficient documentation

## 2021-07-28 DIAGNOSIS — Z931 Gastrostomy status: Secondary | ICD-10-CM | POA: Insufficient documentation

## 2021-07-28 DIAGNOSIS — Z801 Family history of malignant neoplasm of trachea, bronchus and lung: Secondary | ICD-10-CM | POA: Diagnosis not present

## 2021-07-28 LAB — CBC WITH DIFFERENTIAL/PLATELET
Abs Immature Granulocytes: 0.01 10*3/uL (ref 0.00–0.07)
Basophils Absolute: 0 10*3/uL (ref 0.0–0.1)
Basophils Relative: 1 %
Eosinophils Absolute: 0.1 10*3/uL (ref 0.0–0.5)
Eosinophils Relative: 2 %
HCT: 33.7 % — ABNORMAL LOW (ref 39.0–52.0)
Hemoglobin: 11.3 g/dL — ABNORMAL LOW (ref 13.0–17.0)
Immature Granulocytes: 0 %
Lymphocytes Relative: 13 %
Lymphs Abs: 0.6 10*3/uL — ABNORMAL LOW (ref 0.7–4.0)
MCH: 33 pg (ref 26.0–34.0)
MCHC: 33.5 g/dL (ref 30.0–36.0)
MCV: 98.5 fL (ref 80.0–100.0)
Monocytes Absolute: 0.3 10*3/uL (ref 0.1–1.0)
Monocytes Relative: 8 %
Neutro Abs: 3.2 10*3/uL (ref 1.7–7.7)
Neutrophils Relative %: 76 %
Platelets: 181 10*3/uL (ref 150–400)
RBC: 3.42 MIL/uL — ABNORMAL LOW (ref 4.22–5.81)
RDW: 13.5 % (ref 11.5–15.5)
WBC: 4.2 10*3/uL (ref 4.0–10.5)
nRBC: 0 % (ref 0.0–0.2)

## 2021-07-28 LAB — COMPREHENSIVE METABOLIC PANEL
ALT: 16 U/L (ref 0–44)
AST: 23 U/L (ref 15–41)
Albumin: 3.4 g/dL — ABNORMAL LOW (ref 3.5–5.0)
Alkaline Phosphatase: 66 U/L (ref 38–126)
Anion gap: 6 (ref 5–15)
BUN: 23 mg/dL (ref 8–23)
CO2: 27 mmol/L (ref 22–32)
Calcium: 8.8 mg/dL — ABNORMAL LOW (ref 8.9–10.3)
Chloride: 103 mmol/L (ref 98–111)
Creatinine, Ser: 0.99 mg/dL (ref 0.61–1.24)
GFR, Estimated: >60 mL/min (ref >60)
Glucose, Bld: 159 mg/dL — ABNORMAL HIGH (ref 70–99)
Potassium: 3.8 mmol/L (ref 3.5–5.1)
Sodium: 136 mmol/L (ref 135–145)
Total Bilirubin: 0.7 mg/dL (ref 0.3–1.2)
Total Protein: 6.7 g/dL (ref 6.5–8.1)

## 2021-07-28 LAB — MAGNESIUM: Magnesium: 1.8 mg/dL (ref 1.7–2.4)

## 2021-07-28 MED ORDER — SODIUM CHLORIDE 0.9% FLUSH
10.0000 mL | Freq: Once | INTRAVENOUS | Status: AC
  Administered 2021-07-28: 10 mL via INTRAVENOUS
  Filled 2021-07-28: qty 10

## 2021-07-28 MED ORDER — HEPARIN SOD (PORK) LOCK FLUSH 100 UNIT/ML IV SOLN
500.0000 [IU] | Freq: Once | INTRAVENOUS | Status: AC
  Administered 2021-07-28: 500 [IU] via INTRAVENOUS
  Filled 2021-07-28: qty 5

## 2021-07-28 NOTE — Progress Notes (Addendum)
Nutrition Follow-up:  Patient with stage IV pyriform sinus cancer.  Patient has completed concurrent chemotherapy and radiation.  PEG placed on 3/10 (18 Fr)  Met with patient following MD visit.  Patient says that he is not taking much by mouth due to pain when swallows.  Says that hardest thing to swallow is water.  Has taking a few sips of water, eaten few bites of ice cream.  Says that he has a tooth that needs to be extracted.  Patient not taking anything for pain.  No taste. Noted recent MBSS and visit with SLP.  Patient taking 4-5 cartons of isosource 1.5 daily via PEG tube.  Flushing with 120 ml water before and after each feeding.  Says that most mornings he gives 2 cartons at a feeding then tries to give 1 carton 3 hours apart.  Reports sometimes issues with constipation.  Takes miralax when needed.  Surgeon's note reviewed regarding abscess of abdominal wall.  Medications: reviewed  Labs: Na 136, glucose 159, calcium 8.8  Anthropometrics:   Weight 156 lb 12.8 oz  157 lb on 4/26 163 lb on 3/24 166 lb on 3/13 171 lb on 3/6 176 lb on 2/27 182 lb on 2/13 186 lb on 2/6 194 lb on 1/27   Estimated Energy Needs  Kcals: 2300-2700 Protein: 115-135 g Fluid: 2300-2700 ml  NUTRITION DIAGNOSIS: Inadequate oral intake continues, relying on feeding tube    INTERVENTION:  Recommend at first and second feeding of day 2 cartons of isosource 1.5, then for third and fourth feedings 1 carton. Flush with 120 ml water before and after each feeding.   Tube feeding will provide 2250 calories, 102 g protein and 2106 ml free water Encouraged miralax to help with constipation. Encouraged patient to continue to try soft foods orally    MONITORING, EVALUATION, GOAL: weight trends, intake   NEXT VISIT: Thursday, June 15 during infusion  Blayde Bacigalupi [name], [place], [place] Registered Dietitian [phone]

## 2021-07-28 NOTE — Progress Notes (Signed)
Difficulty swallowing not improving and pain with swallowing is 6-7 on pain scale.  Not currently taking the Carate but it did not help when he was taking.   Went to dentist yesterday and needs a tooth pulled but told him he needs to go to oral surgeon due to radiation, dentist is going to f/u with patient.

## 2021-07-28 NOTE — Progress Notes (Signed)
Deal NOTE  Patient Care Team: [facility] as PCP - General Rogue Bussing, Elisha Headland, MD as Consulting Physician (Oncology) Noreene Filbert, MD as Consulting Physician (Radiation Oncology) Clyde Canterbury, MD as Referring Physician (Otolaryngology)  CHIEF COMPLAINTS/PURPOSE OF CONSULTATION: head and neck cancer  #  Oncology History Overview Note  DIAGNOSIS:  A. PIRIFORM SINUS LESION, LEFT; BIOPSY:  - INVASIVE SQUAMOUS CELL CARCINOMA, KERATINIZING.   Asymmetric soft tissue thickening and enhancement of the left aryepiglottic fold and adjacent paraglottic fat suspicious for malignancy. Abnormal right level 1/2 and left level 2 likely cystic/necrotic nodes.   A subcentimeter low-density lesion of the right vallecula probably reflects a cyst but direct visual inspection is recommended.  # JAN 2023-Pyriform sinus- SCC-[Dr.Bennett]clinically T1 [~1cm; N2a]; PET scan Jan 19th, 2023- The mucosal thickening along the left piriform sinus is highlyHypermetabolic'  The contralateral right level IIa lymph node is mildly hypermetabolic along its more solid-appearing anterior inferior border, maximum SUV 4.0. The left level IIa lymph node has activity less than blood pool, although its partially cystic appearance was still unusual and suspicious.   # FEB 6th, 2023- Cisplatin-RT        Cancer of pyriform sinus ([facility])  03/30/2021 Initial Diagnosis   Cancer of pyriform sinus ([facility])   04/18/2021 -  Chemotherapy   Patient is on Treatment Plan : HEAD/NECK Cisplatin q7d       Cancer Staging   Cancer Staging  No matching staging information was found for the patient.    04/18/2021 Cancer Staging   Staging form: Pharynx - Hypopharynx, AJCC 8th Edition - Clinical: Stage IVA (cT1, cN2c, cM0) - Signed by Cammie Sickle, MD on 04/18/2021       HISTORY OF PRESENTING ILLNESS: Ambulating independently.  Alone. Travis Arellano 76 y.o.  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  In the interim patient was evaluated by surgery for abdominal wall abscess around the PEG tube s/p I&D.  Patient states wound is healing well.  He continues to have difficulty swallowing-because of thick mucus.  Also complains of pain however not taking any pain medications.  He continues feeding through the PEG tube.  Overall weight is stable.   No nausea or vomiting.  Review of Systems  Constitutional:  Positive for malaise/fatigue and weight loss. Negative for chills, diaphoresis and fever.  HENT:  Negative for nosebleeds and sore throat.   Eyes:  Negative for double vision.  Respiratory:  Negative for cough, hemoptysis, sputum production, shortness of breath and wheezing.   Cardiovascular:  Negative for chest pain, palpitations, orthopnea and leg swelling.  Gastrointestinal:  Negative for abdominal pain, blood in stool, constipation, diarrhea, heartburn, melena, nausea and vomiting.  Genitourinary:  Negative for dysuria, frequency and urgency.  Musculoskeletal:  Positive for back pain and joint pain.  Skin: Negative.  Negative for itching and rash.  Neurological:  Negative for dizziness, tingling, focal weakness, weakness and headaches.  Endo/Heme/Allergies:  Does not bruise/bleed easily.  Psychiatric/Behavioral:  Negative for depression. The patient is not nervous/anxious and does not have insomnia.     MEDICAL HISTORY:  Past Medical History:  Diagnosis Date   Cancer of pyriform sinus (HCC)    High cholesterol    Hypertension    Wears dentures    Full upper, partial lower    SURGICAL HISTORY: Past Surgical History:  Procedure Laterality Date   CATARACT EXTRACTION W/PHACO Right 05/17/2020  Procedure: CATARACT EXTRACTION PHACO AND INTRAOCULAR LENS PLACEMENT (IOC);  Surgeon: Fabio Pierce, MD;  Location: [facility];  Service: Ophthalmology;  Laterality: Right;  CDE: 15.03    CATARACT EXTRACTION W/PHACO Left 06/11/2020   Procedure: CATARACT EXTRACTION PHACO AND INTRAOCULAR LENS PLACEMENT (IOC);  Surgeon: Fabio Pierce, MD;  Location: [facility];  Service: Ophthalmology;  Laterality: Left;  CDE: 13.98   COLONOSCOPY     IR GASTROSTOMY TUBE MOD SED  05/20/2021   IR IMAGING GUIDED PORT INSERTION  04/05/2021   MICROLARYNGOSCOPY N/A 03/22/2021   Procedure: MICROSCOPIC DIRECT LARYNGOSCOPY WITH BIOPSY;  Surgeon: Geanie Logan, MD;  Location: [facility];  Service: ENT;  Laterality: N/A;   RIGID ESOPHAGOSCOPY N/A 03/22/2021   Procedure: RIGID ESOPHAGOSCOPY;  Surgeon: Geanie Logan, MD;  Location: [facility];  Service: ENT;  Laterality: N/A;    SOCIAL HISTORY: Social History   Socioeconomic History   Marital status: Divorced    Spouse name: Not on file   Number of children: Not on file   Years of education: Not on file   Highest education level: Not on file  Occupational History   Not on file  Tobacco Use   Smoking status: Former    Types: Cigarettes    Quit date: 1999    Years since quitting: 24.3   Smokeless tobacco: Never  Vaping Use   Vaping Use: Never used  Substance and Sexual Activity   Alcohol use: No   Drug use: No   Sexual activity: Not on file  Other Topics Concern   Not on file  Social History Narrative   Lives between [place]- [place] [[address] [place] [address]]; with son and brother. Quit smoking in 1999 [prior all life]; quit alcohol 30 years- used to drink beer. Retd/ Part time- auto-mechanic work.    Social Determinants of Health   Financial Resource Strain: Not on file  Food Insecurity: Not on file  Transportation Needs: Not on file  Physical Activity: Not on file  Stress: Not on file  Social Connections: Not on file  Intimate Partner Violence: Not on file    FAMILY HISTORY: Family History  Problem Relation Age of Onset   Congestive Heart Failure Mother    Diabetes Father    Lung cancer Brother     ALLERGIES:  has No  Known Allergies.  MEDICATIONS:  Current Outpatient Medications  Medication Sig Dispense Refill   Nutritional Supplements (ISOSOURCE 1.5 CAL) LIQD Give 2 cartons at 8am. Give 1 carton at 11am, 2pm, 5pm and 8pm. Flush with 120 ml of water before and after each feeding. Send bolus tube feeding supplies.  HH RN starting on 05/23/21 to provide support for TF management in the home.  Start with 2 cartons daily and then add 1 carton each day until at goal rate. 1659 mL 0   polyethylene glycol powder (MIRALAX) 17 GM/SCOOP powder Take 2 cap fulls 1-2 times daily as needed. 255 g 2   senna (SENOKOT) 8.6 MG TABS tablet Take 2 tablets 1-2 times daily. (Patient taking differently: Take 2 tablets 1-2 times daily prn) 120 tablet 2   sucralfate (CARAFATE) 1 GM/10ML suspension Take 10 mLs (1 g total) by mouth in the morning, at noon, and at bedtime. (Patient not taking: Reported on 07/28/2021) 90 mL 6   No current facility-administered medications for this visit.   Facility-Administered Medications Ordered in Other Visits  Medication Dose Route Frequency Provider Last Rate Last Admin   heparin lock flush 100 UNIT/ML injection  PHYSICAL EXAMINATION: ECOG PERFORMANCE STATUS: 1 - Symptomatic but completely ambulatory  Vitals:   07/28/21 1000  BP: 131/79  Pulse: 70  Resp: 16  Temp: (!) 96.3 F (35.7 C)   Filed Weights   07/28/21 1000  Weight: 156 lb 12.8 oz (71.1 kg)    Physical Exam Vitals and nursing note reviewed.  HENT:     Head: Normocephalic and atraumatic.     Mouth/Throat:     Pharynx: Oropharynx is clear.  Eyes:     Extraocular Movements: Extraocular movements intact.     Pupils: Pupils are equal, round, and reactive to light.  Cardiovascular:     Rate and Rhythm: Normal rate and regular rhythm.  Pulmonary:     Comments: Decreased breath sounds bilaterally.  Abdominal:     Palpations: Abdomen is soft.  Musculoskeletal:        General: Normal range of motion.      Cervical back: Normal range of motion.  Skin:    General: Skin is warm.  Neurological:     General: No focal deficit present.     Mental Status: He is alert and oriented to person, place, and time.  Psychiatric:        Behavior: Behavior normal.        Judgment: Judgment normal.     LABORATORY DATA:  I have reviewed the data as listed Lab Results  Component Value Date   WBC 4.2 07/28/2021   HGB 11.3 (L) 07/28/2021   HCT 33.7 (L) 07/28/2021   MCV 98.5 07/28/2021   PLT 181 07/28/2021   Recent Labs    06/20/21 1222 07/06/21 0905 07/28/21 0950  NA 138 133* 136  K 4.5 3.9 3.8  CL 100 100 103  CO2 32 26 27  GLUCOSE 141* 118* 159*  BUN 36* 25* 23  CREATININE 1.25* 1.12 0.99  CALCIUM 9.1 8.8* 8.8*  GFRNONAA 59* >60 >60  PROT 6.5 6.3* 6.7  ALBUMIN 3.0* 3.3* 3.4*  AST $Re'18 24 23  'DAr$ ALT $R'20 22 16  'ec$ ALKPHOS 63 64 66  BILITOT 1.0 0.8 0.7    RADIOGRAPHIC STUDIES: I have personally reviewed the radiological images as listed and agreed with the findings in the report. DG SWALLOW FUNC OP MEDICARE SPEECH PATH  Result Date: 07/22/2021 CLINICAL DATA:  Cancer of puriform sinus EXAM: MODIFIED BARIUM SWALLOW TECHNIQUE: Different consistencies of barium were administered orally to the patient by the Speech Pathologist. Imaging of the pharynx was performed in the lateral projection. Soyla Dryer, NP was present in the fluoroscopy room during this study, which was supervised and interpreted by Maurine Simmering, MD. FLUOROSCOPY: Radiation Exposure Index (as provided by the fluoroscopic device): 14.1 mGy mGy Kerma COMPARISON:  None Available. FINDINGS: No intratracheal aspiration. A 13 mm barium tablet passed without difficulty. IMPRESSION: No intratracheal aspiration. Please refer to the Speech Pathologists report for complete details and recommendations. Electronically Signed   By: Maurine Simmering M.D.   On: 07/22/2021 14:22    ASSESSMENT & PLAN:   Cancer of pyriform sinus (HCC) # Pyriform sinus-  SCC-clinically T1 [~1cm; N2a]; Stage IVA- PET scan Jan 19th, 2023- The mucosal thickening along the left piriform sinus is highlyHypermetabolic'  The contralateral right level IIa lymph node is mildly hypermetabolic along its more solid-appearing anterior inferior border, maximum SUV 4.0. The left level IIa lymph node has activity less than blood pool, although its partially cystic appearance was still unusual and suspicious. # s/p  cisplatin weekly along with radiation [2/06-3/31].  #  partial response noted; awaiting appt in 1 week with Dr.Bennettfor re-evaluation. Will plan repeating PET scan in mid June 2023 [appx 10 weeks post Rx-ordered today].  #Radiation mucositis: Causing difficulty swallowing.  Patient encouraged to take narcotic pain medication as recommended.  Hycet [given by ENT.  Recommend hold off any dental extractions at this time.  #Nutrition-s/p PEG tube placement continue tube feeds.  Discussed with nutrition.  Met with nutrition today.  # Abdominal abscess-around PEG tube; s/p I&D improved.  Continue monitoring with surgery-Improved.   # IV access/Mediport-  # DISPOSITION: #  NO IVFs/KCL or Mag today; [place]-ACCESS # follow up in 4 weeks- [place]:OXNR- labs- cbc/cmp/mag;-; possible IV KCL 20 meq/2 gm Mag; IVFs over 1 hour; PET scan -Dr.B      All questions were answered. The patient knows to call the clinic with any problems, questions or concerns.       Cammie Sickle, MD 07/28/2021 9:31 PM

## 2021-07-28 NOTE — Assessment & Plan Note (Addendum)
#  Pyriform sinus- SCC-clinically T1 [~1cm; N2a]; Stage IVA- PET scan Jan 19th, 2023- The mucosal thickening along the left piriform sinus is highlyHypermetabolic'  The contralateral right level IIa lymph node is mildly hypermetabolic along its more solid-appearing anterior inferior border, maximum SUV 4.0. The left level IIa lymph node has activity less than blood pool, although its partially cystic appearance was still unusual and suspicious. # s/p  cisplatin weekly along with radiation [2/06-3/31].  # partial response noted; awaiting appt in 1 week with Dr.Bennettfor re-evaluation. Will plan repeating PET scan in mid June 2023 [appx 10 weeks post Rx-ordered today].  #Radiation mucositis: Causing difficulty swallowing.  Patient encouraged to take narcotic pain medication as recommended.  Hycet [given by ENT.  Recommend hold off any dental extractions at this time.  #Nutrition-s/p PEG tube placement continue tube feeds.  Discussed with nutrition.  Met with nutrition today.  # Abdominal abscess-around PEG tube; s/p I&D improved.  Continue monitoring with surgery-Improved.   # IV access/Mediport-  # DISPOSITION: #  NO IVFs/KCL or Mag today; [place]-ACCESS # follow up in 4 weeks- [place]:NZVJ- labs- cbc/cmp/mag;-; possible IV KCL 20 meq/2 gm Mag; IVFs over 1 hour; PET scan -Dr.B

## 2021-08-16 ENCOUNTER — Encounter: Payer: Self-pay | Admitting: Internal Medicine

## 2021-08-18 ENCOUNTER — Ambulatory Visit
Admission: RE | Admit: 2021-08-18 | Discharge: 2021-08-18 | Disposition: A | Discharge status: Home / Self Care | Payer: Medicare Other | Source: Ambulatory Visit | Attending: Internal Medicine | Admitting: Internal Medicine

## 2021-08-18 DIAGNOSIS — Z931 Gastrostomy status: Secondary | ICD-10-CM | POA: Diagnosis not present

## 2021-08-18 DIAGNOSIS — C12 Malignant neoplasm of pyriform sinus: Secondary | ICD-10-CM | POA: Diagnosis not present

## 2021-08-18 DIAGNOSIS — I2510 Atherosclerotic heart disease of native coronary artery without angina pectoris: Secondary | ICD-10-CM | POA: Insufficient documentation

## 2021-08-18 DIAGNOSIS — I700 Atherosclerosis of aorta: Secondary | ICD-10-CM | POA: Insufficient documentation

## 2021-08-18 MED ORDER — FLUDEOXYGLUCOSE F - 18 (FDG) INJECTION
8.1000 | Freq: Once | INTRAVENOUS | Status: AC | PRN
  Administered 2021-08-18: 8.2 via INTRAVENOUS

## 2021-08-19 LAB — GLUCOSE, CAPILLARY: Glucose-Capillary: 103 mg/dL — ABNORMAL HIGH (ref 70–99)

## 2021-08-25 ENCOUNTER — Inpatient Hospital Stay: Payer: Medicare Other

## 2021-08-25 ENCOUNTER — Inpatient Hospital Stay: Payer: Medicare Other | Attending: Internal Medicine | Admitting: Internal Medicine

## 2021-08-25 ENCOUNTER — Ambulatory Visit
Admission: RE | Admit: 2021-08-25 | Discharge: 2021-08-25 | Disposition: A | Discharge status: Home / Self Care | Payer: Medicare Other | Source: Ambulatory Visit | Attending: Internal Medicine | Admitting: Internal Medicine

## 2021-08-25 ENCOUNTER — Encounter: Payer: Self-pay | Admitting: Internal Medicine

## 2021-08-25 VITALS — BP 134/85 | HR 80 | Temp 97.8°F | Ht 71.0 in | Wt 156.4 lb

## 2021-08-25 DIAGNOSIS — I2510 Atherosclerotic heart disease of native coronary artery without angina pectoris: Secondary | ICD-10-CM | POA: Diagnosis not present

## 2021-08-25 DIAGNOSIS — I10 Essential (primary) hypertension: Secondary | ICD-10-CM | POA: Diagnosis not present

## 2021-08-25 DIAGNOSIS — I700 Atherosclerosis of aorta: Secondary | ICD-10-CM | POA: Diagnosis not present

## 2021-08-25 DIAGNOSIS — Z87891 Personal history of nicotine dependence: Secondary | ICD-10-CM | POA: Insufficient documentation

## 2021-08-25 DIAGNOSIS — Y842 Radiological procedure and radiotherapy as the cause of abnormal reaction of the patient, or of later complication, without mention of misadventure at the time of the procedure: Secondary | ICD-10-CM | POA: Insufficient documentation

## 2021-08-25 DIAGNOSIS — Z931 Gastrostomy status: Secondary | ICD-10-CM | POA: Diagnosis not present

## 2021-08-25 DIAGNOSIS — C12 Malignant neoplasm of pyriform sinus: Secondary | ICD-10-CM | POA: Insufficient documentation

## 2021-08-25 DIAGNOSIS — K1233 Oral mucositis (ulcerative) due to radiation: Secondary | ICD-10-CM | POA: Diagnosis not present

## 2021-08-25 DIAGNOSIS — Z801 Family history of malignant neoplasm of trachea, bronchus and lung: Secondary | ICD-10-CM | POA: Diagnosis not present

## 2021-08-25 DIAGNOSIS — R519 Headache, unspecified: Secondary | ICD-10-CM | POA: Insufficient documentation

## 2021-08-25 LAB — CBC WITH DIFFERENTIAL/PLATELET
Abs Immature Granulocytes: 0.03 10*3/uL (ref 0.00–0.07)
Basophils Absolute: 0 10*3/uL (ref 0.0–0.1)
Basophils Relative: 0 %
Eosinophils Absolute: 0.2 10*3/uL (ref 0.0–0.5)
Eosinophils Relative: 3 %
HCT: 34.4 % — ABNORMAL LOW (ref 39.0–52.0)
Hemoglobin: 11.6 g/dL — ABNORMAL LOW (ref 13.0–17.0)
Immature Granulocytes: 1 %
Lymphocytes Relative: 6 %
Lymphs Abs: 0.3 10*3/uL — ABNORMAL LOW (ref 0.7–4.0)
MCH: 33 pg (ref 26.0–34.0)
MCHC: 33.7 g/dL (ref 30.0–36.0)
MCV: 98 fL (ref 80.0–100.0)
Monocytes Absolute: 0.4 10*3/uL (ref 0.1–1.0)
Monocytes Relative: 8 %
Neutro Abs: 4.5 10*3/uL (ref 1.7–7.7)
Neutrophils Relative %: 82 %
Platelets: 165 10*3/uL (ref 150–400)
RBC: 3.51 MIL/uL — ABNORMAL LOW (ref 4.22–5.81)
RDW: 11.8 % (ref 11.5–15.5)
WBC: 5.4 10*3/uL (ref 4.0–10.5)
nRBC: 0 % (ref 0.0–0.2)

## 2021-08-25 LAB — COMPREHENSIVE METABOLIC PANEL
ALT: 15 U/L (ref 0–44)
AST: 20 U/L (ref 15–41)
Albumin: 3.5 g/dL (ref 3.5–5.0)
Alkaline Phosphatase: 68 U/L (ref 38–126)
Anion gap: 8 (ref 5–15)
BUN: 25 mg/dL — ABNORMAL HIGH (ref 8–23)
CO2: 29 mmol/L (ref 22–32)
Calcium: 9.1 mg/dL (ref 8.9–10.3)
Chloride: 101 mmol/L (ref 98–111)
Creatinine, Ser: 1.05 mg/dL (ref 0.61–1.24)
GFR, Estimated: >60 mL/min (ref >60)
Glucose, Bld: 173 mg/dL — ABNORMAL HIGH (ref 70–99)
Potassium: 3.9 mmol/L (ref 3.5–5.1)
Sodium: 138 mmol/L (ref 135–145)
Total Bilirubin: 0.3 mg/dL (ref 0.3–1.2)
Total Protein: 6.9 g/dL (ref 6.5–8.1)

## 2021-08-25 LAB — MAGNESIUM: Magnesium: 2 mg/dL (ref 1.7–2.4)

## 2021-08-25 MED ORDER — HEPARIN SOD (PORK) LOCK FLUSH 100 UNIT/ML IV SOLN
500.0000 [IU] | Freq: Once | INTRAVENOUS | Status: AC
  Administered 2021-08-25: 500 [IU] via INTRAVENOUS
  Filled 2021-08-25: qty 5

## 2021-08-25 MED ORDER — GADOBUTROL 1 MMOL/ML IV SOLN
7.0000 mL | Freq: Once | INTRAVENOUS | Status: AC | PRN
  Administered 2021-08-25: 7 mL via INTRAVENOUS

## 2021-08-25 NOTE — Progress Notes (Signed)
[facility] NOTE  Patient Care Team: [facility] as PCP - General Rogue Bussing, Elisha Headland, MD as Consulting Physician (Oncology) Noreene Filbert, MD as Consulting Physician (Radiation Oncology) Clyde Canterbury, MD as Referring Physician (Otolaryngology)  CHIEF COMPLAINTS/PURPOSE OF CONSULTATION: head and neck cancer  #  Oncology History Overview Note  DIAGNOSIS:  A. PIRIFORM SINUS LESION, LEFT; BIOPSY:  - INVASIVE SQUAMOUS CELL CARCINOMA, KERATINIZING.   Asymmetric soft tissue thickening and enhancement of the left aryepiglottic fold and adjacent paraglottic fat suspicious for malignancy. Abnormal right level 1/2 and left level 2 likely cystic/necrotic nodes.   A subcentimeter low-density lesion of the right vallecula probably reflects a cyst but direct visual inspection is recommended.  # JAN 2023-Pyriform sinus- SCC-[Dr.Bennett]clinically T1 [~1cm; N2a]; PET scan Jan 19th, 2023- The mucosal thickening along the left piriform sinus is highlyHypermetabolic'  The contralateral right level IIa lymph node is mildly hypermetabolic along its more solid-appearing anterior inferior border, maximum SUV 4.0. The left level IIa lymph node has activity less than blood pool, although its partially cystic appearance was still unusual and suspicious.   # FEB 6th, 2023- Cisplatin-RT        Cancer of pyriform sinus ([place])  03/30/2021 Initial Diagnosis   Cancer of pyriform sinus ([place])   04/18/2021 -  Chemotherapy   Patient is on Treatment Plan : HEAD/NECK Cisplatin q7d      Cancer Staging   Cancer Staging  No matching staging information was found for the patient.   04/18/2021 Cancer Staging   Staging form: Pharynx - Hypopharynx, AJCC 8th Edition - Clinical: Stage IVA (cT1, cN2c, cM0) - Signed by Cammie Sickle, MD on 04/18/2021      HISTORY OF PRESENTING ILLNESS: Ambulating independently.  Alone.  Travis Arellano 76 y.o.  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   C/o sore throat.  Also complains of bilateral frontal headaches.  Going on for the last few weeks worse after dental extraction.  Patient not taking his narcotic pain medication.  He continues to have difficulty swallowing-because of thick mucus.  He continues feeding through the PEG tube.  Overall weight is stable.   No nausea or vomiting.  Review of Systems  Constitutional:  Positive for malaise/fatigue and weight loss. Negative for chills, diaphoresis and fever.  HENT:  Negative for nosebleeds and sore throat.   Eyes:  Negative for double vision.  Respiratory:  Negative for cough, hemoptysis, sputum production, shortness of breath and wheezing.   Cardiovascular:  Negative for chest pain, palpitations, orthopnea and leg swelling.  Gastrointestinal:  Negative for abdominal pain, blood in stool, constipation, diarrhea, heartburn, melena, nausea and vomiting.  Genitourinary:  Negative for dysuria, frequency and urgency.  Musculoskeletal:  Positive for back pain and joint pain.  Skin: Negative.  Negative for itching and rash.  Neurological:  Negative for dizziness, tingling, focal weakness, weakness and headaches.  Endo/Heme/Allergies:  Does not bruise/bleed easily.  Psychiatric/Behavioral:  Negative for depression. The patient is not nervous/anxious and does not have insomnia.      MEDICAL HISTORY:  Past Medical History:  Diagnosis Date   Cancer of pyriform sinus (HCC)    High cholesterol    Hypertension    Wears dentures    Full upper, partial lower    SURGICAL HISTORY: Past Surgical  History:  Procedure Laterality Date   CATARACT EXTRACTION W/PHACO Right 05/17/2020   Procedure: CATARACT EXTRACTION PHACO AND INTRAOCULAR LENS PLACEMENT (IOC);  Surgeon: Baruch Goldmann,  MD;  Location: [facility];  Service: Ophthalmology;  Laterality: Right;  CDE: 15.03   CATARACT EXTRACTION W/PHACO Left 06/11/2020   Procedure: CATARACT EXTRACTION PHACO AND INTRAOCULAR LENS PLACEMENT (IOC);  Surgeon: Baruch Goldmann, MD;  Location: [facility];  Service: Ophthalmology;  Laterality: Left;  CDE: 13.98   COLONOSCOPY     IR GASTROSTOMY TUBE MOD SED  05/20/2021   IR IMAGING GUIDED PORT INSERTION  04/05/2021   MICROLARYNGOSCOPY N/A 03/22/2021   Procedure: MICROSCOPIC DIRECT LARYNGOSCOPY WITH BIOPSY;  Surgeon: Clyde Canterbury, MD;  Location: [facility];  Service: ENT;  Laterality: N/A;   RIGID ESOPHAGOSCOPY N/A 03/22/2021   Procedure: RIGID ESOPHAGOSCOPY;  Surgeon: Clyde Canterbury, MD;  Location: [facility];  Service: ENT;  Laterality: N/A;    SOCIAL HISTORY: Social History   Socioeconomic History   Marital status: Divorced    Spouse name: Not on file   Number of children: Not on file   Years of education: Not on file   Highest education level: Not on file  Occupational History   Not on file  Tobacco Use   Smoking status: Former    Types: Cigarettes    Quit date: 1999    Years since quitting: 24.4   Smokeless tobacco: Never  Vaping Use   Vaping Use: Never used  Substance and Sexual Activity   Alcohol use: No   Drug use: No   Sexual activity: Not on file  Other Topics Concern   Not on file  Social History Narrative   Lives between [place] [[address]]; with son and brother. Quit smoking in 1999 [prior all life]; quit alcohol 30 years- used to drink beer. Retd/ Part time- auto-mechanic work.    Social Determinants of Health   Financial Resource Strain: Not on file  Food Insecurity: Not on file  Transportation Needs: Not on file  Physical Activity: Not on file  Stress: Not on file  Social Connections: Not on file  Intimate Partner Violence: Not on file    FAMILY HISTORY: Family History  Problem Relation Age of Onset   Congestive Heart  Failure Mother    Diabetes Father    Lung cancer Brother     ALLERGIES:  has No Known Allergies.  MEDICATIONS:  Current Outpatient Medications  Medication Sig Dispense Refill   Nutritional Supplements (ISOSOURCE 1.5 CAL) LIQD Give 2 cartons at 8am. Give 1 carton at 11am, 2pm, 5pm and 8pm. Flush with 120 ml of water before and after each feeding. Send bolus tube feeding supplies.  [place] RN starting on 05/23/21 to provide support for TF management in the home.  Start with 2 cartons daily and then add 1 carton each day until at goal rate. 1659 mL 0   polyethylene glycol powder (MIRALAX) 17 GM/SCOOP powder Take 2 cap fulls 1-2 times daily as needed. 255 g 2   senna (SENOKOT) 8.6 MG TABS tablet Take 2 tablets 1-2 times daily. (Patient taking differently: Take 2 tablets 1-2 times daily prn) 120 tablet 2   chlorhexidine (PERIDEX) 0.12 % solution SMARTSIG:By Mouth     traMADol (ULTRAM) 50 MG tablet Take by mouth.     No current facility-administered medications for this visit.   Facility-Administered Medications Ordered in Other Visits  Medication Dose Route Frequency Provider Last Rate Last Admin   heparin lock flush  100 UNIT/ML injection              PHYSICAL EXAMINATION: ECOG PERFORMANCE STATUS: 1 - Symptomatic but completely ambulatory  Vitals:   08/25/21 0917  BP: 134/85  Pulse: 80  Temp: 97.8 F (36.6 C)  SpO2: 100%   Filed Weights   08/25/21 0917  Weight: 156 lb 6.4 oz (70.9 kg)    Physical Exam Vitals and nursing note reviewed.  HENT:     Head: Normocephalic and atraumatic.     Mouth/Throat:     Pharynx: Oropharynx is clear.  Eyes:     Extraocular Movements: Extraocular movements intact.     Pupils: Pupils are equal, round, and reactive to light.  Cardiovascular:     Rate and Rhythm: Normal rate and regular rhythm.  Pulmonary:     Comments: Decreased breath sounds bilaterally.  Abdominal:     Palpations: Abdomen is soft.  Musculoskeletal:        General: Normal  range of motion.     Cervical back: Normal range of motion.  Skin:    General: Skin is warm.  Neurological:     General: No focal deficit present.     Mental Status: He is alert and oriented to person, place, and time.  Psychiatric:        Behavior: Behavior normal.        Judgment: Judgment normal.      LABORATORY DATA:  I have reviewed the data as listed Lab Results  Component Value Date   WBC 5.4 08/25/2021   HGB 11.6 (L) 08/25/2021   HCT 34.4 (L) 08/25/2021   MCV 98.0 08/25/2021   PLT 165 08/25/2021   Recent Labs    07/06/21 0905 07/28/21 0950 08/25/21 0917  NA 133* 136 138  K 3.9 3.8 3.9  CL 100 103 101  CO2 $Re'26 27 29  'IYU$ GLUCOSE 118* 159* 173*  BUN 25* 23 25*  CREATININE 1.12 0.99 1.05  CALCIUM 8.8* 8.8* 9.1  GFRNONAA >60 >60 >60  PROT 6.3* 6.7 6.9  ALBUMIN 3.3* 3.4* 3.5  AST $Re'24 23 20  'Oce$ ALT $R'22 16 15  'eB$ ALKPHOS 64 66 68  BILITOT 0.8 0.7 0.3    RADIOGRAPHIC STUDIES: I have personally reviewed the radiological images as listed and agreed with the findings in the report. NM PET Image Restage (PS) Skull Base to Thigh (F-18 FDG)  Result Date: 08/19/2021 CLINICAL DATA:  Subsequent treatment strategy for head/neck cancer. EXAM: NUCLEAR MEDICINE PET SKULL BASE TO THIGH TECHNIQUE: 8.2 mCi F-18 FDG was injected intravenously. Full-ring PET imaging was performed from the skull base to thigh after the radiotracer. CT data was obtained and used for attenuation correction and anatomic localization. Fasting blood glucose: 103 mg/dl COMPARISON:  CT abdomen pelvis 06/20/2021, PET 03/31/2021. FINDINGS: Mediastinal blood pool activity: SUV max 2.1 Liver activity: SUV max NA NECK: Residual hypermetabolism associated with the left piriform sinus, SUV max 6.2, decreased from 10.5. Measurement of the corresponding soft tissue lesion on CT is challenging without IV contrast. Minimally hypermetabolic right level II lymph node measures 9 mm (2/60), decreased in size from 1.5 cm and SUV max 4.0.  No additional hypermetabolic lymph nodes. Incidental CT findings: None. CHEST: No hypermetabolic lymph nodes.  No hypermetabolic pulmonary nodules. Incidental CT findings: Right IJ Port-A-Cath terminates in the low SVC. Atherosclerotic calcification of the aorta, aortic valve and coronary arteries. Heart size normal. No pericardial or pleural effusion. Paraseptal emphysema. ABDOMEN/PELVIS: No abnormal hypermetabolism in the liver, adrenal glands, spleen  or pancreas. Incidental CT findings: Liver, gallbladder and adrenal glands are unremarkable. Probable cysts in the lower pole right kidney. No specific follow-up necessary. Left kidney, spleen and pancreas are grossly unremarkable. Percutaneous gastrostomy in place. Stomach and bowel are otherwise unremarkable. Atherosclerotic calcification of the aorta. SKELETON: No abnormal osseous hypermetabolism. Incidental CT findings: Degenerative changes in the spine. IMPRESSION: 1. Interval response to therapy as evidenced by decrease in hypermetabolism associated with a left piriform sinus lesion. 2. Decrease in size of a right level II lymph node with similar mild hypermetabolism. 3. Aortic atherosclerosis (ICD10-I70.0). Coronary artery calcification. Electronically Signed   By: Lorin Picket M.D.   On: 08/19/2021 16:01    ASSESSMENT & PLAN:   Cancer of pyriform sinus (HCC) # Pyriform sinus- SCC-clinically T1 [~1cm; N2a]; Stage IVA- PET scan Jan 19th, 2023- The mucosal thickening along the left piriform sinus is highlyHypermetabolic'  The contralateral right level IIa lymph node is mildly hypermetabolic along its more solid-appearing anterior inferior border, maximum SUV 4.0. . # s/p  cisplatin weekly along with radiation [2/06-3/31].  # 10 weeks- POST treatment PET- June 9th, 2023-  Interval response to therapy as evidenced by decrease in hypermetabolism associated with a left piriform sinus lesion. Decrease in size of a right level II lymph node with similar mild  hypermetabolism.  Recent ENT evaluation in May, 2023-noted to have poor visualization because of edema/gag reflex.  Discussed with Dr. Donella Stade and Dr. Richardson Landry.  Consider evaluation at [facility] for biopsy; and any surgery if needed.  I discussed with the patient; however defer to ENT for further recommendations/referrals.  #Radiation mucositis: Causing difficulty swallowing.  Patient encouraged to take narcotic pain medication as recommended.  Hycet [given by ENT.  Stable.  #Nutrition-s/p PEG tube placement continue tube feeds.  Discussed with nutrition.  Met with nutrition today.  Stable  # Headaches-?  Unclear etiology worse during night.  Recommend evaluation with MRI brain.   # #Incidental findings on Imaging  PET scan, 2023:aortic atherosclerosis; Coronary artery calcification.I reviewed/discussed/counseled the patient.   # IV access/Mediport-  # DISPOSITION: #  NO IVFs/KCL or Mag today; DE-ACCESS # MRI Brain ASAP # follow up in 4 weeks- MD: port- labs- cbc/cmp/mag;--Dr.B      All questions were answered. The patient knows to call the clinic with any problems, questions or concerns.       Cammie Sickle, MD 08/25/2021 4:44 PM

## 2021-08-25 NOTE — Progress Notes (Signed)
No treatment per MD, port deaccessed.

## 2021-08-25 NOTE — Assessment & Plan Note (Addendum)
#  Pyriform sinus- SCC-clinically T1 [~1cm; N2a]; Stage IVA- PET scan Jan 19th, 2023- The mucosal thickening along the left piriform sinus is highlyHypermetabolic'  The contralateral right level IIa lymph node is mildly hypermetabolic along its more solid-appearing anterior inferior border, maximum SUV 4.0. . # s/p  cisplatin weekly along with radiation [2/06-3/31].  # 10 weeks- POST treatment PET- June 9th, 2023-  Interval response to therapy as evidenced by decrease in hypermetabolism associated with a left piriform sinus lesion. Decrease in size of a right level II lymph node with similar mild hypermetabolism.  Recent ENT evaluation in May, 2023-noted to have poor visualization because of edema/gag reflex.  Discussed with Dr. Donella Stade and Dr. Richardson Landry.  Consider evaluation at [facility] for biopsy; and any surgery if needed.  I discussed with the patient; however defer to ENT for further recommendations/referrals.  #Radiation mucositis: Causing difficulty swallowing.  Patient encouraged to take narcotic pain medication as recommended.  Hycet [given by ENT.  Stable.  #Nutrition-s/p PEG tube placement continue tube feeds.  Discussed with nutrition.  Met with nutrition today.  Stable  # Headaches-?  Unclear etiology worse during night.  Recommend evaluation with MRI brain.   # #Incidental findings on Imaging  PET scan, 2023:aortic atherosclerosis; Coronary artery calcification.I reviewed/discussed/counseled the patient.   # IV access/Mediport-  # DISPOSITION: #  NO IVFs/KCL or Mag today; DE-ACCESS # MRI Brain ASAP # follow up in 4 weeks- MD: port- labs- cbc/cmp/mag;--Dr.B  # I reviewed the blood work- with the patient in detail; also reviewed the imaging independently [as summarized above]; and with the patient in detail. ,

## 2021-08-25 NOTE — Progress Notes (Signed)
Had 4 teeth pulled yesterday, hurts right much 5/10 pain, worse at night.  Scan results.  C/o sore throat.

## 2021-08-25 NOTE — Progress Notes (Signed)
Nutrition Follow-up:   Patient with stage IV pyriform sinus cancer.  Patient completed concurrent chemotherapy and radiation.  PEG placed on 3/10 (18 Fr).   Met with patient following MD visit.  Patient reports that he is not taking much of anything via mouth due to throat being so sore.  Only taking tylenol.  Had 4 teeth removed yesterday and mouth is sore.    Says that he is taking 5-6 cartons of isosource 1.5 via feeding tube daily.  Usually takes 2 cartons at first feeding, then 1 carton and then maybe 1-2 and then 1 carton. Flushes with 120 ml water before and after each feeding.  Denies nausea.  Has bowel movement about every 2-3 days with the help of miralax.      Medications: miralax, tramadol added today  Labs: glucose 173, BUN 25, creatinine 1.05  Anthropometrics:   Weight 156 lb 6.4 oz, stable  156 lb 12.8 oz on 5/18 157 lb on 4/26 163 lb on 3/24 166 lb on 3/13 171 lb on 3/6 176 lb on 2/27 182 lb on 2/13 194 lb on 1/27   Estimated Energy Needs  Kcals: 2300-2700 Protein: 115-135 g Fluid: 2300-2760ml  NUTRITION DIAGNOSIS: Inadequate oral intake continues, relying on feeding tube   INTERVENTION:  Encouraged patient to utilize pain medication so that he can eat orally.  Discussed importance of utilizing swallowing muscles with eating and drinking. Continue at least 6 cartons of isosource 1.5 via feeding tube daily (2 cartons at 2 feedings and 1 carton at other 2 feedings). Flush with 120 ml of water before and after.  Adding additional fluid of 480 ml (2 cups) daily either orally or via tube for better hydration and maybe help with constipation.    Tube feeding provides 2250 calories, 102 g, 2586 ml.     MONITORING, EVALUATION, GOAL: weight trends, intake, tube feeding   NEXT VISIT: Thursday, July 13 in clinic  [place]. Zenia Resides, [place], [place] Registered Dietitian [phone]

## 2021-09-07 ENCOUNTER — Encounter (HOSPITAL_COMMUNITY): Payer: Self-pay | Admitting: Emergency Medicine

## 2021-09-07 ENCOUNTER — Other Ambulatory Visit: Payer: Self-pay

## 2021-09-07 ENCOUNTER — Emergency Department (HOSPITAL_COMMUNITY)
Admission: EM | Admit: 2021-09-07 | Discharge: 2021-09-07 | Disposition: A | Discharge status: Home / Self Care | Payer: Medicare Other | Attending: Emergency Medicine | Admitting: Emergency Medicine

## 2021-09-07 DIAGNOSIS — L0501 Pilonidal cyst with abscess: Secondary | ICD-10-CM | POA: Diagnosis not present

## 2021-09-07 DIAGNOSIS — Z8522 Personal history of malignant neoplasm of nasal cavities, middle ear, and accessory sinuses: Secondary | ICD-10-CM | POA: Diagnosis not present

## 2021-09-07 DIAGNOSIS — I10 Essential (primary) hypertension: Secondary | ICD-10-CM | POA: Insufficient documentation

## 2021-09-07 DIAGNOSIS — L0291 Cutaneous abscess, unspecified: Secondary | ICD-10-CM

## 2021-09-07 LAB — CBC WITH DIFFERENTIAL/PLATELET
Abs Immature Granulocytes: 0.03 10*3/uL (ref 0.00–0.07)
Basophils Absolute: 0 10*3/uL (ref 0.0–0.1)
Basophils Relative: 0 %
Eosinophils Absolute: 0 10*3/uL (ref 0.0–0.5)
Eosinophils Relative: 0 %
HCT: 34.5 % — ABNORMAL LOW (ref 39.0–52.0)
Hemoglobin: 11.5 g/dL — ABNORMAL LOW (ref 13.0–17.0)
Immature Granulocytes: 0 %
Lymphocytes Relative: 4 %
Lymphs Abs: 0.4 10*3/uL — ABNORMAL LOW (ref 0.7–4.0)
MCH: 32.3 pg (ref 26.0–34.0)
MCHC: 33.3 g/dL (ref 30.0–36.0)
MCV: 96.9 fL (ref 80.0–100.0)
Monocytes Absolute: 0.8 10*3/uL (ref 0.1–1.0)
Monocytes Relative: 8 %
Neutro Abs: 8.4 10*3/uL — ABNORMAL HIGH (ref 1.7–7.7)
Neutrophils Relative %: 88 %
Platelets: 184 10*3/uL (ref 150–400)
RBC: 3.56 MIL/uL — ABNORMAL LOW (ref 4.22–5.81)
RDW: 11.6 % (ref 11.5–15.5)
WBC: 9.6 10*3/uL (ref 4.0–10.5)
nRBC: 0 % (ref 0.0–0.2)

## 2021-09-07 LAB — BASIC METABOLIC PANEL
Anion gap: 11 (ref 5–15)
BUN: 26 mg/dL — ABNORMAL HIGH (ref 8–23)
CO2: 28 mmol/L (ref 22–32)
Calcium: 9.1 mg/dL (ref 8.9–10.3)
Chloride: 99 mmol/L (ref 98–111)
Creatinine, Ser: 0.95 mg/dL (ref 0.61–1.24)
GFR, Estimated: >60 mL/min (ref >60)
Glucose, Bld: 102 mg/dL — ABNORMAL HIGH (ref 70–99)
Potassium: 3.9 mmol/L (ref 3.5–5.1)
Sodium: 138 mmol/L (ref 135–145)

## 2021-09-07 MED ORDER — POVIDONE-IODINE 10 % EX SOLN
CUTANEOUS | Status: DC | PRN
  Filled 2021-09-07: qty 14.8

## 2021-09-07 MED ORDER — DOXYCYCLINE HYCLATE 100 MG PO CAPS: 100.0000 mg | ORAL_CAPSULE | Freq: Two times a day (BID) | ORAL | 0 refills | Status: DC

## 2021-09-07 MED ORDER — HYDROCODONE-ACETAMINOPHEN 7.5-325 MG/15ML PO SOLN: 10.0000 mL | Freq: Four times a day (QID) | ORAL | 0 refills | Status: DC | PRN

## 2021-09-07 MED ORDER — DOXYCYCLINE MONOHYDRATE 25 MG/5ML PO SUSR: 100.0000 mg | Freq: Two times a day (BID) | ORAL | 0 refills | Status: DC

## 2021-09-07 MED ORDER — LIDOCAINE-EPINEPHRINE (PF) 2 %-1:200000 IJ SOLN
10.0000 mL | Freq: Once | INTRAMUSCULAR | Status: AC
  Administered 2021-09-07: 10 mL via INTRADERMAL
  Filled 2021-09-07: qty 20

## 2021-09-07 MED ORDER — DOXYCYCLINE HYCLATE 100 MG PO TABS
100.0000 mg | ORAL_TABLET | ORAL | Status: AC
  Administered 2021-09-07: 100 mg
  Filled 2021-09-07: qty 1

## 2021-09-07 MED ORDER — FENTANYL CITRATE PF 50 MCG/ML IJ SOSY
12.5000 ug | PREFILLED_SYRINGE | Freq: Once | INTRAMUSCULAR | Status: AC
  Administered 2021-09-07: 12.5 ug via INTRAVENOUS
  Filled 2021-09-07: qty 1

## 2021-09-07 NOTE — ED Notes (Signed)
Sent a message to [place], [place] said that the pharmacy called and said that the antibiotic you ordered was not available at his pharmacy; waiting to see if you want to change this before discharge is completed-pt still here and also, pt asking if a dressing or something can be placed over area so it does not leak all over him and his clothes?

## 2021-09-07 NOTE — Discharge Instructions (Signed)
You have an abscess to your buttock.  This has been incised and drained.  There is a packing in place.  This will need to be removed in 2 to 3 days.  I recommend that you do warm water soaks or warm wet compresses to the area 2-3 times a day until it heals.  Take the antibiotic as directed until it is finished.  Return to the emergency department for any new or worsening symptoms or return in 2 to 3 days for packing removal if needed.

## 2021-09-07 NOTE — ED Notes (Signed)
Dressing applied to the wound.

## 2021-09-07 NOTE — ED Notes (Signed)
Told to hold off on d/c right now; pt's pharmacy does not have antibiotic prescribed; will see if PA wants to change RX

## 2021-09-07 NOTE — ED Provider Notes (Signed)
[facility] EMERGENCY DEPARTMENT Provider Note   CSN: [id number] Arrival date & time: 09/07/21  1234     History  Chief Complaint  Patient presents with   Abscess    Travis Arellano is a 76 y.o. male.   Abscess Associated symptoms: no fever, no nausea and no vomiting         Travis Arellano is a 76 y.o. male with past medical history of piriform sinus cancer and hypertension who presents to the Emergency Department complaining of redness swelling and pain to the uppermost left gluteal fold.  Symptoms began 1 week ago.  Comes in today due to gradually increasing pain and redness of the area.  Pain is worse with sitting and with palpation to the area.  States he is able to defecate without difficulty.  Having some nausea without vomiting or diarrhea.  No rectal pain, abdominal pain fever or chills. Completed chemotherapy and radiation, last chemotherapy in March of this year.  Home Medications Prior to Admission medications   Medication Sig Start Date End Date Taking? Authorizing Provider  chlorhexidine (PERIDEX) 0.12 % solution SMARTSIG:By Mouth 08/12/21   [provider]  Nutritional Supplements (ISOSOURCE 1.5 CAL) LIQD Give 2 cartons at 8am. Give 1 carton at 11am, 2pm, 5pm and 8pm. Flush with 120 ml of water before and after each feeding. Send bolus tube feeding supplies.  [place] RN starting on 05/23/21 to provide support for TF management in the home.  Start with 2 cartons daily and then add 1 carton each day until at goal rate. 05/19/21   Cammie Sickle, MD  polyethylene glycol powder (MIRALAX) 17 GM/SCOOP powder Take 2 cap fulls 1-2 times daily as needed. 06/06/21   Verlon Au, NP  senna (SENOKOT) 8.6 MG TABS tablet Take 2 tablets 1-2 times daily. Patient taking differently: Take 2 tablets 1-2 times daily prn 06/06/21   Verlon Au, NP  traMADol (ULTRAM) 50 MG tablet Take by mouth. 08/24/21   [provider]      Allergies    Patient has no known  allergies.    Review of Systems   Review of Systems  Constitutional:  Negative for appetite change, chills and fever.  Respiratory:  Negative for shortness of breath.   Cardiovascular:  Negative for chest pain.  Gastrointestinal:  Negative for abdominal pain, nausea and vomiting.  Musculoskeletal:  Negative for arthralgias, back pain and neck pain.  Skin:  Positive for color change and wound.       Redness pain and swelling to the upper left gluteal fold  Neurological:  Negative for dizziness, weakness and numbness.    Physical Exam Updated Vital Signs BP (!) 144/72 (BP Location: Right Arm)   Pulse 99   Temp 98.4 F (36.9 C) (Oral)   Resp 15   Ht '5\' 11"'$  (1.803 m)   Wt 70.3 kg   SpO2 99%   BMI 21.62 kg/m  Physical Exam Vitals and nursing note reviewed.  Constitutional:      Appearance: Normal appearance. He is not ill-appearing or toxic-appearing.  HENT:     Mouth/Throat:     Mouth: Mucous membranes are moist.  Cardiovascular:     Rate and Rhythm: Normal rate and regular rhythm.     Pulses: Normal pulses.  Pulmonary:     Effort: Pulmonary effort is normal. No respiratory distress.  Abdominal:     General: There is no distension.     Palpations: Abdomen is soft.  Tenderness: There is no abdominal tenderness.  Musculoskeletal:        General: Normal range of motion.     Cervical back: Normal range of motion.  Skin:    General: Skin is warm.     Findings: Erythema and rash present.     Comments: 4 cm oblong area of induration, erythema and warmth of the uppermost left gluteal fold.  No significant fluctuance.  No drainage.  Neurological:     General: No focal deficit present.     Mental Status: He is alert.     Sensory: No sensory deficit.     Motor: No weakness.     ED Results / Procedures / Treatments   Labs (all labs ordered are listed, but only abnormal results are displayed) Labs Reviewed - No data to display  EKG None  Radiology No results  found.  Procedures Procedures     INCISION AND DRAINAGE Performed by: Kamyrah Feeser Consent: Verbal consent obtained. Risks and benefits: risks, benefits and alternatives were discussed Type: abscess  Body area: Left upper gluteal fold  Anesthesia: local infiltration  Incision was made with a #11 scalpel.  Local anesthetic: lidocaine 2% with epinephrine  Anesthetic total: 3 ml  Complexity: complex Blunt dissection to break up loculations  Drainage: purulent  Drainage amount: Small  Packing material: 1/4 in iodoform gauze  Patient tolerance: Patient tolerated the procedure well with no immediate complications.   Medications Ordered in ED Medications - No data to display  ED Course/ Medical Decision Making/ A&P                           Medical Decision Making Patient here with piriform sinus cancer completed chemotherapy and radiation in March of this year here for evaluation of localized area of pain redness and swelling to the left upper buttock area.  Symptoms present x1 week.  Pain associated with sitting but denies any pain or difficulty with defecation.  No abdominal pain fever or chills.  On exam, patient is uncomfortable appearing.  Vital signs are reassuring.  He does not appear toxic.  There is no soft tissue crepitus of the area.  No involvement of the rectum.  Abdomen is soft and nontender.  Differential would include abscess, necrotizing fasciitis.  Patient does not appear toxic and there is no soft tissue crepitus on my exam.  Feel that necrotizing fasciitis is doubtful.  Discussed treatment options with patient.  He is agreeable to attempted I&D.  Successful I&D, small amt of purulent drainage.  Abscess packed with iodoform gauze He agrees to warm water soaks, abx and close out pt f/u.  Strict return precautions discussed   Amount and/or Complexity of Data Reviewed Labs: ordered.    Details: labs w/o significant derangement  Risk Prescription  drug management.           Final Clinical Impression(s) / ED Diagnoses Final diagnoses:  Abscess    Rx / DC Orders ED Discharge Orders     None         Bufford Lope 09/08/21 2153    Noemi Chapel, MD 09/15/21 1718

## 2021-09-07 NOTE — ED Triage Notes (Signed)
Pt presents with abscess mid buttocks area, red in color, afebrile in triage.

## 2021-09-20 ENCOUNTER — Telehealth: Payer: Self-pay | Admitting: Internal Medicine

## 2021-09-20 ENCOUNTER — Telehealth: Payer: Self-pay

## 2021-09-20 NOTE — Telephone Encounter (Signed)
Patient is scheduled for surgery at [facility] and wanted to cancel his appointment until after he is home. He will be in the Hospital for at least 3 weeks and will call when he is home to reschedule. Appointment cancelled. Thank you

## 2021-09-20 NOTE — Telephone Encounter (Signed)
Patient called and stated he is scheduled for surgery at [facility] and would like to cancel appointment until he is home. He states it will be about 3 weeks. He will call and reschedule.  Per Chat with Dr. Jacinto Reap ok to cancel appointments.

## 2021-09-22 ENCOUNTER — Inpatient Hospital Stay: Payer: Medicare Other

## 2021-09-22 ENCOUNTER — Inpatient Hospital Stay: Payer: Medicare Other | Admitting: Internal Medicine

## 2021-10-03 ENCOUNTER — Other Ambulatory Visit: Payer: Self-pay

## 2021-10-11 ENCOUNTER — Other Ambulatory Visit: Payer: Self-pay

## 2021-10-14 ENCOUNTER — Ambulatory Visit: Payer: Medicare Other | Admitting: Radiation Oncology

## 2021-10-19 ENCOUNTER — Telehealth: Payer: Self-pay | Admitting: Internal Medicine

## 2021-10-19 NOTE — Telephone Encounter (Signed)
BC- Please schedule follow-up with 6 weeks; labs CBC CMP; thyroid panel; magnesium; vitamin D 25-hydroxy levels.  Thanks GB

## 2021-10-20 ENCOUNTER — Other Ambulatory Visit: Payer: Self-pay

## 2021-10-20 DIAGNOSIS — C12 Malignant neoplasm of pyriform sinus: Secondary | ICD-10-CM

## 2021-10-20 NOTE — Telephone Encounter (Signed)
Labs entered.

## 2021-10-21 ENCOUNTER — Other Ambulatory Visit: Payer: Self-pay

## 2021-11-02 ENCOUNTER — Ambulatory Visit
Admission: RE | Admit: 2021-11-02 | Discharge: 2021-11-02 | Disposition: A | Discharge status: Home / Self Care | Payer: Medicare Other | Source: Ambulatory Visit | Attending: Radiation Oncology | Admitting: Radiation Oncology

## 2021-11-02 ENCOUNTER — Other Ambulatory Visit: Payer: Self-pay

## 2021-11-02 VITALS — BP 102/62 | HR 72 | Temp 98.0°F | Resp 18 | Ht 71.0 in | Wt 153.0 lb

## 2021-11-02 DIAGNOSIS — C12 Malignant neoplasm of pyriform sinus: Secondary | ICD-10-CM | POA: Diagnosis present

## 2021-11-02 DIAGNOSIS — Z923 Personal history of irradiation: Secondary | ICD-10-CM | POA: Insufficient documentation

## 2021-11-02 DIAGNOSIS — C329 Malignant neoplasm of larynx, unspecified: Secondary | ICD-10-CM

## 2021-11-02 DIAGNOSIS — Z85850 Personal history of malignant neoplasm of thyroid: Secondary | ICD-10-CM | POA: Insufficient documentation

## 2021-11-02 NOTE — Progress Notes (Signed)
Radiation Oncology Follow up Note  Name: Travis Arellano   Date:   11/02/2021 MRN:  [id number] DOB: 12-Sep-1945    This 76 y.o. 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  A PET scan in June showed interval response to therapy with decreased hypermetabolic associated with the left piriform sinus and decreased size of right level 2 lymph nodes.  [facility].  There was a 4.2 cm moderately differentiated squamous cell carcinoma centered in the left piriform sinus of the hypopharynx with invasion into the thyroid cartilage.  Perineural lymph-vascular invasion were identified.  Margins were negative.  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  1 of 12 nodes was positive for metastatic papillary thyroid carcinoma 2 cm in greatest dimension.  [facility]  COMPLICATIONS OF TREATMENT: none  FOLLOW UP COMPLIANCE: keeps appointments   PHYSICAL EXAM:  BP 102/62   Pulse 72   Temp 98 F (36.7 C)   Resp 18   Ht '5\' 11"'$  (1.803 m)   Wt 153 lb (69.4 kg)   BMI 21.34 kg/m  Patient is status post laryngeal pharyngectomy and bilateral neck dissection incisions appear well-healed.  Neck is clear without evidence of cervical adenopathy.  Well-developed well-nourished patient in NAD. HEENT reveals PERLA, EOMI, discs not visualized.  Oral cavity is clear. No oral mucosal lesions are identified. Neck is clear without evidence of cervical or supraclavicular adenopathy. Lungs are clear to A&P. Cardiac examination is  essentially unremarkable with regular rate and rhythm without murmur rub or thrill. Abdomen is benign with no organomegaly or masses noted. Motor sensory and DTR levels are equal and symmetric in the upper and lower extremities. Cranial nerves II through XII are grossly intact. Proprioception is intact. No peripheral adenopathy or edema is identified. No motor or sensory levels are noted. Crude visual fields are within normal range.  RADIOLOGY RESULTS: No current films for review  PLAN: Present time [facility]  I have asked to see him back in 6 months for follow-up.  We will review any follow-up scans as a become available.  Patient comprehends my recommendations well.  I would like to take this opportunity to thank you for allowing me to participate in the care of your patient..Noreene Filbert MD

## 2021-11-03 ENCOUNTER — Other Ambulatory Visit: Payer: Self-pay

## 2021-11-10 ENCOUNTER — Ambulatory Visit: Payer: Medicare Other | Admitting: Radiation Oncology

## 2021-11-11 ENCOUNTER — Ambulatory Visit: Payer: Medicare Other | Admitting: Radiation Oncology

## 2021-11-18 ENCOUNTER — Other Ambulatory Visit: Payer: Self-pay | Admitting: Interventional Radiology

## 2021-11-18 ENCOUNTER — Ambulatory Visit
Admission: RE | Admit: 2021-11-18 | Discharge: 2021-11-18 | Disposition: A | Discharge status: Home / Self Care | Payer: Medicare Other | Source: Ambulatory Visit | Attending: Interventional Radiology | Admitting: Interventional Radiology

## 2021-11-18 DIAGNOSIS — Z09 Encounter for follow-up examination after completed treatment for conditions other than malignant neoplasm: Secondary | ICD-10-CM | POA: Diagnosis not present

## 2021-11-18 DIAGNOSIS — Z431 Encounter for attention to gastrostomy: Secondary | ICD-10-CM | POA: Diagnosis present

## 2021-11-18 DIAGNOSIS — C801 Malignant (primary) neoplasm, unspecified: Secondary | ICD-10-CM

## 2021-11-18 HISTORY — PX: IR REPLC GASTRO/COLONIC TUBE PERCUT W/FLUORO: IMG2333

## 2021-11-18 MED ORDER — IOHEXOL 350 MG/ML SOLN: 8.0000 mL | Freq: Once | INTRAVENOUS | Status: DC | PRN

## 2021-11-18 MED ORDER — IOHEXOL 300 MG/ML  SOLN
8.0000 mL | Freq: Once | INTRAMUSCULAR | Status: AC | PRN
  Administered 2021-11-18: 8 mL

## 2021-12-01 ENCOUNTER — Inpatient Hospital Stay: Payer: Medicare Other | Attending: Internal Medicine

## 2021-12-01 ENCOUNTER — Encounter: Payer: Self-pay | Admitting: Internal Medicine

## 2021-12-01 ENCOUNTER — Inpatient Hospital Stay (HOSPITAL_BASED_OUTPATIENT_CLINIC_OR_DEPARTMENT_OTHER): Payer: Medicare Other | Admitting: Internal Medicine

## 2021-12-01 ENCOUNTER — Other Ambulatory Visit: Payer: Medicare Other

## 2021-12-01 ENCOUNTER — Ambulatory Visit: Payer: Medicare Other | Admitting: Internal Medicine

## 2021-12-01 VITALS — BP 124/77 | HR 64 | Temp 96.5°F | Ht 71.0 in | Wt 165.0 lb

## 2021-12-01 DIAGNOSIS — E890 Postprocedural hypothyroidism: Secondary | ICD-10-CM | POA: Diagnosis not present

## 2021-12-01 DIAGNOSIS — I10 Essential (primary) hypertension: Secondary | ICD-10-CM | POA: Diagnosis not present

## 2021-12-01 DIAGNOSIS — Z95828 Presence of other vascular implants and grafts: Secondary | ICD-10-CM

## 2021-12-01 DIAGNOSIS — E039 Hypothyroidism, unspecified: Secondary | ICD-10-CM

## 2021-12-01 DIAGNOSIS — C12 Malignant neoplasm of pyriform sinus: Secondary | ICD-10-CM | POA: Diagnosis present

## 2021-12-01 DIAGNOSIS — Z87891 Personal history of nicotine dependence: Secondary | ICD-10-CM | POA: Insufficient documentation

## 2021-12-01 DIAGNOSIS — Z923 Personal history of irradiation: Secondary | ICD-10-CM | POA: Diagnosis not present

## 2021-12-01 DIAGNOSIS — Z79890 Hormone replacement therapy (postmenopausal): Secondary | ICD-10-CM | POA: Diagnosis not present

## 2021-12-01 DIAGNOSIS — Z801 Family history of malignant neoplasm of trachea, bronchus and lung: Secondary | ICD-10-CM | POA: Diagnosis not present

## 2021-12-01 DIAGNOSIS — Z930 Tracheostomy status: Secondary | ICD-10-CM | POA: Insufficient documentation

## 2021-12-01 DIAGNOSIS — Z79899 Other long term (current) drug therapy: Secondary | ICD-10-CM | POA: Diagnosis not present

## 2021-12-01 LAB — CBC WITH DIFFERENTIAL/PLATELET
Abs Immature Granulocytes: 0.02 10*3/uL (ref 0.00–0.07)
Basophils Absolute: 0 10*3/uL (ref 0.0–0.1)
Basophils Relative: 0 %
Eosinophils Absolute: 0.2 10*3/uL (ref 0.0–0.5)
Eosinophils Relative: 3 %
HCT: 34.3 % — ABNORMAL LOW (ref 39.0–52.0)
Hemoglobin: 11.3 g/dL — ABNORMAL LOW (ref 13.0–17.0)
Immature Granulocytes: 0 %
Lymphocytes Relative: 10 %
Lymphs Abs: 0.5 10*3/uL — ABNORMAL LOW (ref 0.7–4.0)
MCH: 30.3 pg (ref 26.0–34.0)
MCHC: 32.9 g/dL (ref 30.0–36.0)
MCV: 92 fL (ref 80.0–100.0)
Monocytes Absolute: 0.5 10*3/uL (ref 0.1–1.0)
Monocytes Relative: 9 %
Neutro Abs: 4 10*3/uL (ref 1.7–7.7)
Neutrophils Relative %: 78 %
Platelets: 168 10*3/uL (ref 150–400)
RBC: 3.73 MIL/uL — ABNORMAL LOW (ref 4.22–5.81)
RDW: 14.1 % (ref 11.5–15.5)
WBC: 5.2 10*3/uL (ref 4.0–10.5)
nRBC: 0 % (ref 0.0–0.2)

## 2021-12-01 LAB — COMPREHENSIVE METABOLIC PANEL
ALT: 12 U/L (ref 0–44)
AST: 18 U/L (ref 15–41)
Albumin: 3.2 g/dL — ABNORMAL LOW (ref 3.5–5.0)
Alkaline Phosphatase: 62 U/L (ref 38–126)
Anion gap: 6 (ref 5–15)
BUN: 12 mg/dL (ref 8–23)
CO2: 25 mmol/L (ref 22–32)
Calcium: 8.7 mg/dL — ABNORMAL LOW (ref 8.9–10.3)
Chloride: 108 mmol/L (ref 98–111)
Creatinine, Ser: 1.03 mg/dL (ref 0.61–1.24)
GFR, Estimated: >60 mL/min (ref >60)
Glucose, Bld: 117 mg/dL — ABNORMAL HIGH (ref 70–99)
Potassium: 3.7 mmol/L (ref 3.5–5.1)
Sodium: 139 mmol/L (ref 135–145)
Total Bilirubin: 0.7 mg/dL (ref 0.3–1.2)
Total Protein: 6.8 g/dL (ref 6.5–8.1)

## 2021-12-01 LAB — MAGNESIUM: Magnesium: 1.9 mg/dL (ref 1.7–2.4)

## 2021-12-01 MED ORDER — HEPARIN SOD (PORK) LOCK FLUSH 100 UNIT/ML IV SOLN
500.0000 [IU] | Freq: Once | INTRAVENOUS | Status: AC
  Administered 2021-12-01: 500 [IU] via INTRAVENOUS
  Filled 2021-12-01: qty 5

## 2021-12-01 MED ORDER — SODIUM CHLORIDE 0.9% FLUSH
10.0000 mL | Freq: Once | INTRAVENOUS | Status: AC
  Administered 2021-12-01: 10 mL via INTRAVENOUS
  Filled 2021-12-01: qty 10

## 2021-12-01 NOTE — Progress Notes (Signed)
[place] NOTE  Patient Care Team: [facility] as PCP - General Rogue Bussing, Elisha Headland, MD as Consulting Physician (Oncology) Noreene Filbert, MD as Consulting Physician (Radiation Oncology) Clyde Canterbury, MD as Referring Physician (Otolaryngology)  CHIEF COMPLAINTS/PURPOSE OF CONSULTATION: head and neck cancer  #  Oncology History Overview Note  DIAGNOSIS:  A. PIRIFORM SINUS LESION, LEFT; BIOPSY:  - INVASIVE SQUAMOUS CELL CARCINOMA, KERATINIZING.   Asymmetric soft tissue thickening and enhancement of the left aryepiglottic fold and adjacent paraglottic fat suspicious for malignancy. Abnormal right level 1/2 and left level 2 likely cystic/necrotic nodes.   A subcentimeter low-density lesion of the right vallecula probably reflects a cyst but direct visual inspection is recommended.  # JAN 2023-Pyriform sinus- SCC-[Dr.Bennett]clinically T1 [~1cm; N2a]; PET scan Jan 19th, 2023- The mucosal thickening along the left piriform sinus is highlyHypermetabolic'  The contralateral right level IIa lymph node is mildly hypermetabolic along its more solid-appearing anterior inferior border, maximum SUV 4.0. The left level IIa lymph node has activity less than blood pool, although its partially cystic appearance was still unusual and suspicious.   # FEB 6th, 2023- Cisplatin-RT  #JULY 18th, 2023 Patient had salvage total laryngectomy, partial pharyngectomy, left hemithyroidectomy, bilateral neck dissection, reconstruction with L ALT on 09/27/2021 by Dr. Conley Canal and Dr. Amada Jupiter at [facility].  Pathology showed 4.2 cm SCCa, moderately differentiated centered in the left pyriform sinus of the hypopharynx with patient into the thyroid cartilage, PNI present, margins negative.  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 1/12 lymph node positive for  metastatic papillary thyroid cancer measuring 2 cm, ENE (-)        Cancer of pyriform sinus ([place])  03/30/2021 Initial Diagnosis   Cancer of pyriform sinus ([place])   04/18/2021 -  Chemotherapy   Patient is on Treatment Plan : HEAD/NECK Cisplatin q7d      Cancer Staging   Cancer Staging  No matching staging information was found for the patient.   04/18/2021 Cancer Staging   Staging form: Pharynx - Hypopharynx, AJCC 8th Edition - Clinical: Stage IVA (cT1, cN2c, cM0) - Signed by Cammie Sickle, MD on 04/18/2021      HISTORY OF PRESENTING ILLNESS: Ambulating independently.  Alone.  Travis Arellano 76 y.o.  male with pyriform sinus cancer/locally advanced s/p concurrent chemoradiation [march 31st 2023] with residual disease on PET CT scan status post salvage laryngectomy with bilateral lymph node dissection at [facility].  Patient trach in place.  He is using writing board to communicate.  He is taking his food mainly through G-tube.  Reports he is waiting for his dentures.  Patient denies fever, chills, nausea, vomiting, shortness of breath, cough, abdominal pain, bleeding, bowel or bladder issues. Denies pain.   Review of Systems  Constitutional:  Positive for malaise/fatigue. Negative for chills, diaphoresis and fever.  HENT:  Negative for nosebleeds and sore throat.   Eyes:  Negative for double vision.  Respiratory:  Negative for cough, hemoptysis, sputum production, shortness of breath and wheezing.   Cardiovascular:  Negative for chest pain, palpitations, orthopnea and leg swelling.  Gastrointestinal:  Negative for abdominal pain, blood in stool, constipation, diarrhea, heartburn, melena, nausea and vomiting.  Genitourinary:  Negative for dysuria, frequency and urgency.  Skin: Negative.  Negative for itching and rash.  Neurological:  Negative for dizziness, tingling, focal weakness, weakness and headaches.  Endo/Heme/Allergies:  Does not bruise/bleed easily.  Psychiatric/Behavioral:  Negative for depression. The patient is not nervous/anxious and does not have insomnia.      MEDICAL HISTORY:  Past Medical History:  Diagnosis Date   Cancer of pyriform sinus (HCC)    High cholesterol    Hypertension    Wears dentures    Full upper, partial lower    SURGICAL HISTORY: Past Surgical History:  Procedure Laterality Date   CATARACT EXTRACTION W/PHACO Right 05/17/2020   Procedure: CATARACT EXTRACTION PHACO AND INTRAOCULAR LENS PLACEMENT (IOC);  Surgeon: Baruch Goldmann, MD;  Location: [facility];  Service: Ophthalmology;  Laterality: Right;  CDE: 15.03   CATARACT EXTRACTION W/PHACO Left 06/11/2020   Procedure: CATARACT EXTRACTION PHACO AND INTRAOCULAR LENS PLACEMENT (IOC);  Surgeon: Baruch Goldmann, MD;  Location: [facility];  Service: Ophthalmology;  Laterality: Left;  CDE: 13.98   COLONOSCOPY     IR GASTROSTOMY TUBE MOD SED  05/20/2021   IR IMAGING GUIDED PORT INSERTION  04/05/2021   IR REPLC GASTRO/COLONIC TUBE PERCUT W/FLUORO  11/18/2021   MICROLARYNGOSCOPY N/A 03/22/2021   Procedure: MICROSCOPIC DIRECT LARYNGOSCOPY WITH BIOPSY;  Surgeon: Clyde Canterbury, MD;  Location: [place];  Service: ENT;  Laterality: N/A;   RIGID ESOPHAGOSCOPY N/A 03/22/2021   Procedure: RIGID ESOPHAGOSCOPY;  Surgeon: Clyde Canterbury, MD;  Location: [place];  Service: ENT;  Laterality: N/A;    SOCIAL HISTORY: Social History   Socioeconomic History   Marital status: Divorced    Spouse name: Not on file   Number of children: Not on file   Years of education: Not on file   Highest education level: Not on file  Occupational History   Not on file  Tobacco Use   Smoking status: Former    Types: Cigarettes    Quit date: 1999    Years since quitting: 24.7   Smokeless tobacco: Never  Vaping Use   Vaping Use: Never used  Substance and Sexual Activity   Alcohol use: No   Drug use: No   Sexual activity: Not on file  Other Topics Concern   Not on file  Social  History Narrative   Lives between [place] [30 mins drive]; with son and brother. Quit smoking in 1999 [prior all life]; quit alcohol 30 years- used to drink beer. Retd/ Part time- auto-mechanic work.    Social Determinants of Health   Financial Resource Strain: Not on file  Food Insecurity: Not on file  Transportation Needs: Not on file  Physical Activity: Not on file  Stress: Not on file  Social Connections: Not on file  Intimate Partner Violence: Not on file    FAMILY HISTORY: Family History  Problem Relation Age of Onset   Congestive Heart Failure Mother    Diabetes Father    Lung cancer Brother     ALLERGIES:  has No Known Allergies.  MEDICATIONS:  Current Outpatient Medications  Medication Sig Dispense Refill   chlorhexidine (PERIDEX) 0.12 % solution SMARTSIG:By Mouth (Patient not taking: Reported on 12/01/2021)     doxycycline (VIBRAMYCIN) 100 MG capsule Take 1 capsule (100 mg total) by mouth 2 (two) times daily. (Patient not taking: Reported on 12/01/2021) 20 capsule 0   HYDROcodone-acetaminophen (HYCET) 7.5-325 mg/15 ml solution Place 10 mLs into feeding tube every 6 (six) hours as needed. (Patient not taking: Reported on 12/01/2021) 60 mL 0   levothyroxine (SYNTHROID) 100 MCG tablet Take 100 mcg by mouth daily.     Nutritional Supplements (ISOSOURCE 1.5 CAL) LIQD Give 2 cartons at 8am. Give  1 carton at 11am, 2pm, 5pm and 8pm. Flush with 120 ml of water before and after each feeding. Send bolus tube feeding supplies.  [place] RN starting on 05/23/21 to provide support for TF management in the home.  Start with 2 cartons daily and then add 1 carton each day until at goal rate. (Patient not taking: Reported on 12/01/2021) 1659 mL 0   polyethylene glycol powder (MIRALAX) 17 GM/SCOOP powder Take 2 cap fulls 1-2 times daily as needed. (Patient not taking: Reported on 12/01/2021) 255 g 2   senna (SENOKOT) 8.6 MG TABS tablet Take 2 tablets 1-2 times daily. (Patient not taking:  Reported on 12/01/2021) 120 tablet 2   terazosin (HYTRIN) 5 MG capsule Take 5 mg by mouth at bedtime.     No current facility-administered medications for this visit.   Facility-Administered Medications Ordered in Other Visits  Medication Dose Route Frequency Provider Last Rate Last Admin   heparin lock flush 100 UNIT/ML injection              PHYSICAL EXAMINATION: ECOG PERFORMANCE STATUS: 1 - Symptomatic but completely ambulatory  Vitals:   12/01/21 1001  BP: 124/77  Pulse: 64  Temp: (!) 96.5 F (35.8 C)  SpO2: 99%   Filed Weights   12/01/21 1001  Weight: 165 lb (74.8 kg)    Physical Exam Vitals and nursing note reviewed.  HENT:     Head: Normocephalic and atraumatic.     Mouth/Throat:     Pharynx: Oropharynx is clear.  Eyes:     Extraocular Movements: Extraocular movements intact.     Pupils: Pupils are equal, round, and reactive to light.  Cardiovascular:     Rate and Rhythm: Normal rate and regular rhythm.  Pulmonary:     Comments: Decreased breath sounds bilaterally.  Abdominal:     Palpations: Abdomen is soft.  Musculoskeletal:        General: Normal range of motion.     Cervical back: Normal range of motion.  Skin:    General: Skin is warm.  Neurological:     General: No focal deficit present.     Mental Status: He is alert and oriented to person, place, and time.  Psychiatric:        Behavior: Behavior normal.        Judgment: Judgment normal.      LABORATORY DATA:  I have reviewed the data as listed Lab Results  Component Value Date   WBC 5.2 12/01/2021   HGB 11.3 (L) 12/01/2021   HCT 34.3 (L) 12/01/2021   MCV 92.0 12/01/2021   PLT 168 12/01/2021   Recent Labs    07/28/21 0950 08/25/21 0917 09/07/21 1610 12/01/21 0938  NA 136 138 138 139  K 3.8 3.9 3.9 3.7  CL 103 101 99 108  CO2 '27 29 28 25  '$ GLUCOSE 159* 173* 102* 117*  BUN 23 25* 26* 12  CREATININE 0.99 1.05 0.95 1.03  CALCIUM 8.8* 9.1 9.1 8.7*  GFRNONAA >60 >60 >60 >60  PROT  6.7 6.9  --  6.8  ALBUMIN 3.4* 3.5  --  3.2*  AST 23 20  --  18  ALT 16 15  --  12  ALKPHOS 66 68  --  62  BILITOT 0.7 0.3  --  0.7    RADIOGRAPHIC STUDIES: I have personally reviewed the radiological images as listed and agreed with the findings in the report. IR Replc Gastro/Colonic Tube Percut W/Fluoro  Result Date: 11/18/2021 INDICATION:  First exchange. Routine. History of head neck cancer s/p gastrostomy placement 05/20/2021. EXAM: FLUOROSCOPIC GUIDED EXCHANGE OF GASTROSTOMY TUBE COMPARISON:  CT [facility], 06/20/2021. MEDICATIONS: None. CONTRAST:  66m OMNIPAQUE IOHEXOL 300 MG/ML SOLN - administered into the gastric lumen FLUOROSCOPY TIME:  Fluoroscopic dose; 2.6 mGy COMPLICATIONS: None immediate. PROCEDURE: Informed written consent was obtained from the the patient and/or patient's representative after a discussion of the risks, benefits and alternatives to treatment. Questions regarding the procedure were encouraged and answered. A timeout was performed prior to the initiation of the procedure. The upper abdomen and external portion of the existing gastrostomy tube was prepped and draped in the usual sterile fashion, and a sterile drape was applied covering the operative field. Maximum barrier sterile technique with sterile gowns and gloves were used for the procedure. A timeout was performed prior to the initiation of the procedure. The existing gastrostomy tube was injected with a small amount of contrast confirming appropriate positioning within the gastric lumen. The external portion of the gastrostomy tube was cut decompressing the retention balloon. Next, the gastrostomy tube was cannulated with a stiff Glidewire which was coiled within the gastric fundus. Over the stiff guide wire, the existing gastrostomy tube was exchanged for a new 18 Fr balloon inflatable gastrostomy tube. The balloon was inflated with saline and dilute contrast and pulled against the anterior inner lumen of the stomach and the  external disc was cinched. Contrast was injected and a post procedural spot fluoroscopic image was obtained confirming appropriate positioning and functionality of the new gastrostomy tube. A dressing was applied. The patient tolerated the procedure well without immediate postprocedural complication. Procedure assisted by CMurvin NatalIR RT IMPRESSION: Successful fluoroscopic guided exchange for a new 18 Fr gastrostomy tube. The gastrostomy tube is ready for immediate use. PLAN: Patient will return to [facility] (VIR) for routine gastrostomy exchange (without imaging) in 6 months. JMichaelle Birks MD [facility] Electronically Signed   By: JMichaelle BirksM.D.   On: 11/18/2021 15:31    ASSESSMENT & PLAN:   Cancer of pyriform sinus (HCC) # Pyriform sinus- SCC-clinically T1 [~1cm; N2a]; Stage IVA- PET scan Jan 19th, 2023- The mucosal thickening along the left piriform sinus is highlyHypermetabolic'  The contralateral right level IIa lymph node is mildly hypermetabolic along its more solid-appearing anterior inferior border, maximum SUV 4.0. . # s/p  cisplatin weekly along with radiation [2/06-3/31].   # 10 weeks- POST treatment PET- June 9th, 2023-showed residual disease.  Interval response to therapy as evidenced by decrease in hypermetabolism associated with a left piriform sinus lesion. Decrease in size of a right level II lymph node with similar mild hypermetabolism.    # JULY 18th, 2023 Patient had salvage total laryngectomy, partial pharyngectomy, left hemithyroidectomy, bilateral neck dissection, reconstruction with L ALT on 09/27/2021 by Dr. SConley Canaland [facility] as [facility].    [facility]  He saw Dr.Lumley this month and on exam there was NED. Next appt in Oct.   #Nutrition-s/p PEG tube placement continue tube feeds.  Following with nutrition.  Stable   #Hypothyroidism  postsurgery-on levothyroxine 100 mcg daily.  TSH pending from today.  # IV access/Mediport-   # DISPOSITION: #Follow-up with Dr. B in 6 months and labs. Discussed with the patient that we will leave a voicemail if rescheduling is needed after discussing with Dr. B.   All questions were answered. The patient knows to call  the clinic with any problems, questions or concerns.    Jane Canary, MD 12/01/2021 1:31 PM

## 2021-12-02 ENCOUNTER — Other Ambulatory Visit: Payer: Self-pay

## 2021-12-02 LAB — VITAMIN D 25 HYDROXY (VIT D DEFICIENCY, FRACTURES): Vit D, 25-Hydroxy: 36.17 ng/mL (ref 30–100)

## 2021-12-02 LAB — THYROID PANEL WITH TSH
Free Thyroxine Index: 2.2 (ref 1.2–4.9)
T3 Uptake Ratio: 33 % (ref 24–39)
T4, Total: 6.6 ug/dL (ref 4.5–12.0)
TSH: 1.31 u[IU]/mL (ref 0.450–4.500)

## 2021-12-05 ENCOUNTER — Other Ambulatory Visit: Payer: Self-pay

## 2021-12-08 ENCOUNTER — Inpatient Hospital Stay: Payer: Medicare Other

## 2021-12-08 NOTE — Progress Notes (Signed)
Nutrition  Unable to reach patient via phone today for scheduled phone follow-up.  Noted patient using white board during last clinic visit due to salvage laryngectomy with bilateral lymph node dissection at [facility]. Received teaching on electrolarynx during admission.    Will ask scheduling to schedule in person RD visit to assess nutritional status following surgery.  Ellar Hakala [name], [place], [place] Registered Dietitian [phone]

## 2021-12-16 ENCOUNTER — Inpatient Hospital Stay: Payer: Medicare Other | Attending: Internal Medicine

## 2021-12-16 NOTE — Progress Notes (Signed)
Nutrition  Patient was a no show to nutrition appointment for today.   Travis Arellano [name], [name], [name] Registered Dietitian [phone]

## 2021-12-19 ENCOUNTER — Telehealth: Payer: Self-pay | Admitting: *Deleted

## 2021-12-19 NOTE — Telephone Encounter (Signed)
CAll from [facility] asking if patient received radiation therapy and if so how many Gy did patient receive. I can only see 2 follow up notes form [facility] in EPIC and neither say what he got. Please return her call

## 2021-12-20 ENCOUNTER — Encounter: Payer: Self-pay | Admitting: Internal Medicine

## 2021-12-26 ENCOUNTER — Other Ambulatory Visit: Payer: Self-pay | Admitting: Otolaryngology

## 2021-12-26 DIAGNOSIS — C760 Malignant neoplasm of head, face and neck: Secondary | ICD-10-CM

## 2022-01-16 ENCOUNTER — Ambulatory Visit: Payer: Medicare Other

## 2022-02-22 ENCOUNTER — Inpatient Hospital Stay: Payer: Medicare Other | Attending: Internal Medicine

## 2022-02-22 DIAGNOSIS — I10 Essential (primary) hypertension: Secondary | ICD-10-CM | POA: Insufficient documentation

## 2022-02-22 DIAGNOSIS — Z801 Family history of malignant neoplasm of trachea, bronchus and lung: Secondary | ICD-10-CM | POA: Insufficient documentation

## 2022-02-22 DIAGNOSIS — Z87891 Personal history of nicotine dependence: Secondary | ICD-10-CM | POA: Insufficient documentation

## 2022-02-22 DIAGNOSIS — C12 Malignant neoplasm of pyriform sinus: Secondary | ICD-10-CM | POA: Insufficient documentation

## 2022-02-28 ENCOUNTER — Other Ambulatory Visit: Payer: Self-pay | Admitting: Pharmacist

## 2022-03-07 ENCOUNTER — Inpatient Hospital Stay: Payer: Medicare Other

## 2022-03-07 ENCOUNTER — Ambulatory Visit: Payer: Medicare Other | Admitting: Internal Medicine

## 2022-03-07 ENCOUNTER — Inpatient Hospital Stay (HOSPITAL_BASED_OUTPATIENT_CLINIC_OR_DEPARTMENT_OTHER): Payer: Medicare Other | Admitting: Internal Medicine

## 2022-03-07 ENCOUNTER — Other Ambulatory Visit: Payer: Medicare Other

## 2022-03-07 ENCOUNTER — Encounter: Payer: Self-pay | Admitting: Internal Medicine

## 2022-03-07 VITALS — BP 100/63 | HR 72 | Temp 97.0°F | Resp 18 | Wt 152.0 lb

## 2022-03-07 DIAGNOSIS — C12 Malignant neoplasm of pyriform sinus: Secondary | ICD-10-CM

## 2022-03-07 DIAGNOSIS — I10 Essential (primary) hypertension: Secondary | ICD-10-CM | POA: Diagnosis not present

## 2022-03-07 DIAGNOSIS — Z801 Family history of malignant neoplasm of trachea, bronchus and lung: Secondary | ICD-10-CM | POA: Diagnosis not present

## 2022-03-07 DIAGNOSIS — Z87891 Personal history of nicotine dependence: Secondary | ICD-10-CM | POA: Diagnosis not present

## 2022-03-07 LAB — CBC WITH DIFFERENTIAL/PLATELET
Abs Immature Granulocytes: 0.02 10*3/uL (ref 0.00–0.07)
Basophils Absolute: 0 10*3/uL (ref 0.0–0.1)
Basophils Relative: 0 %
Eosinophils Absolute: 0.1 10*3/uL (ref 0.0–0.5)
Eosinophils Relative: 1 %
HCT: 30.7 % — ABNORMAL LOW (ref 39.0–52.0)
Hemoglobin: 9.9 g/dL — ABNORMAL LOW (ref 13.0–17.0)
Immature Granulocytes: 0 %
Lymphocytes Relative: 7 %
Lymphs Abs: 0.5 10*3/uL — ABNORMAL LOW (ref 0.7–4.0)
MCH: 27.6 pg (ref 26.0–34.0)
MCHC: 32.2 g/dL (ref 30.0–36.0)
MCV: 85.5 fL (ref 80.0–100.0)
Monocytes Absolute: 0.7 10*3/uL (ref 0.1–1.0)
Monocytes Relative: 10 %
Neutro Abs: 5.8 10*3/uL (ref 1.7–7.7)
Neutrophils Relative %: 82 %
Platelets: 263 10*3/uL (ref 150–400)
RBC: 3.59 MIL/uL — ABNORMAL LOW (ref 4.22–5.81)
RDW: 14.1 % (ref 11.5–15.5)
WBC: 7.1 10*3/uL (ref 4.0–10.5)
nRBC: 0 % (ref 0.0–0.2)

## 2022-03-07 LAB — COMPREHENSIVE METABOLIC PANEL
ALT: 46 U/L — ABNORMAL HIGH (ref 0–44)
AST: 28 U/L (ref 15–41)
Albumin: 2.7 g/dL — ABNORMAL LOW (ref 3.5–5.0)
Alkaline Phosphatase: 61 U/L (ref 38–126)
Anion gap: 5 (ref 5–15)
BUN: 37 mg/dL — ABNORMAL HIGH (ref 8–23)
CO2: 28 mmol/L (ref 22–32)
Calcium: 10.3 mg/dL (ref 8.9–10.3)
Chloride: 102 mmol/L (ref 98–111)
Creatinine, Ser: 1.24 mg/dL (ref 0.61–1.24)
GFR, Estimated: 60 mL/min — ABNORMAL LOW (ref >60)
Glucose, Bld: 142 mg/dL — ABNORMAL HIGH (ref 70–99)
Potassium: 4 mmol/L (ref 3.5–5.1)
Sodium: 135 mmol/L (ref 135–145)
Total Bilirubin: 0.6 mg/dL (ref 0.3–1.2)
Total Protein: 7.5 g/dL (ref 6.5–8.1)

## 2022-03-07 LAB — MAGNESIUM: Magnesium: 2.2 mg/dL (ref 1.7–2.4)

## 2022-03-07 MED ORDER — HYDROCODONE-ACETAMINOPHEN 7.5-325 MG/15ML PO SOLN: 10.0000 mL | Freq: Four times a day (QID) | ORAL | 0 refills | Status: DC | PRN

## 2022-03-07 NOTE — Assessment & Plan Note (Addendum)
#   Stage IVA cT1N2 left piriform sinus SCC s/p CRT with cisplatin; completed 06/10/21. Found to have persistent disease and s/p salvage total laryngectomy, BND (II-IV), left hemi-thyroidectomy with CND, and ALT free flap reconstruction on 09/27/21 for persistent disease, pT4aN0M0. Risk factors included PNI and LVI but did not have adjuvant XRT.   Unfortunately PET/CT ([facility]-01/18/2022) shows large avid lesion at the base of the free flap along with multiple lung/mediastinal lesions concerning for distant disease.  Patient currently undergoing palliative radiation with [facility] to the base of the free flap lesion.   # I had a long discussion the patient regarding further treatment options including chemotherapy-immunotherapy vs single agent immunotherapy.  I am concerned about patient's tolerance to chemoimmunotherapy given significant decline in performance status overall feeling poorly.    Patient is currently undergoing [facility] [place].  Patient also evaluated medical oncology at [facility]-recommend systemic chemoimmunotherapy.  Concerned about patient's tolerance to chemoimmunotherapy-I would recommend single agent pembrolizumab [cognizant of CPS 1]-which might lead to low responses blood pressure tolerance.  The patient tolerance improves then consider adding CarboTaxol.  I discussed the mechanism of action; The goal of therapy is palliative; and length of treatments are likely ongoing/based upon the results of the scans. Discussed the potential side effects of immunotherapy including but not limited to diarrhea; skin rash; elevated LFTs/endocrine abnormalities etc.  # Also discussed the median survival with treatment is approximate 12 months.  However with the treatment patient's life expectancy less than 6 months.  Patient interested in proceeding with treatment.   #Nutrition-s/p PEG tube placement continue tube feeds; possible weight loss.  Recommend follow-up with nutrition.  Referral made.  #  Pain control-secondary to malignancy recommend hydrocodone liquid new prescription sent.  # IV access/Mediport- port flush today  # DISPOSITION: # refer to [place] re; nutrition # Follow up TBD-Dr.B  # 40 minutes face-to-face with the patient discussing the above plan of care; more than 50% of time spent on prognosis/ natural history; counseling and coordination.

## 2022-03-07 NOTE — Progress Notes (Unsigned)
Patient here today for follow up regarding head and neck cancer. Patient reports pain to neck, trach 5/10. Patient reports he was given oxycodone by [facility] but is unsure of dose at this time. Patient has follow up with [facility] on Thursday regarding trach. Patient needs appointments for port flush/maintenance. Patients family reports patient is feeling weaker than normal.

## 2022-03-07 NOTE — Progress Notes (Signed)
DISCONTINUE ON PATHWAY REGIMEN - Head and Neck     A cycle is every 7 days:     Cisplatin   **Always confirm dose/schedule in your pharmacy ordering system**  REASON: Disease Progression PRIOR TREATMENT: [id number]: Cisplatin 40 mg/m2 q7 Days with Concurrent Radiation TREATMENT RESPONSE: Progressive Disease (PD)  START ON PATHWAY REGIMEN - Head and Neck     A cycle is every 21 days:     Carboplatin      Fluorouracil      Pembrolizumab   **Always confirm dose/schedule in your pharmacy ordering system**  Patient Characteristics: Hypopharynx, Metastatic, First Line, No Prior Platinum-Based Chemoradiation within 6 Months, PD-L1 Expression Negative (CPS < 1) / Unknown Disease Classification: Hypopharynx Therapeutic Status: Metastatic Disease Line of Therapy: First Line Prior Platinum Status: No Prior Platinum-Based Chemoradiation within 6 Months PD-L1 Expression Status: Awaiting Test Results Intent of Therapy: Non-Curative / Palliative Intent, Discussed with Patient

## 2022-03-07 NOTE — Progress Notes (Unsigned)
Travis Arellano  Patient Care Team: The Travis Arellano as PCP - General Travis Arellano, Travis Headland, MD as Consulting Physician (Oncology) Travis Filbert, MD as Consulting Physician (Radiation Oncology) Travis Canterbury, MD as Referring Physician (Otolaryngology)  CHIEF COMPLAINTS/PURPOSE OF CONSULTATION: head and neck cancer  #  Oncology History Overview Arellano  DIAGNOSIS:  A. PIRIFORM SINUS LESION, LEFT; BIOPSY:  - INVASIVE SQUAMOUS CELL CARCINOMA, KERATINIZING.   Asymmetric soft tissue thickening and enhancement of the left aryepiglottic fold and adjacent paraglottic fat suspicious for malignancy. Abnormal right level 1/2 and left level 2 likely cystic/necrotic nodes.   A subcentimeter low-density lesion of the right vallecula probably reflects a cyst but direct visual inspection is recommended.  # JAN 2023-Pyriform sinus- SCC-[Dr.Bennett]clinically T1 [~1cm; N2a]; PET scan Jan 19th, 2023- The mucosal thickening along the left piriform sinus is highlyHypermetabolic'  The contralateral right level IIa lymph node is mildly hypermetabolic along its more solid-appearing anterior inferior border, maximum SUV 4.0. The left level IIa lymph node has activity less than blood pool, although its partially cystic appearance was still unusual and suspicious.   # FEB 6th, 2023- Cisplatin-RT  #JULY 18th, 2023 Patient had salvage total laryngectomy, partial pharyngectomy, left hemithyroidectomy, bilateral neck dissection, reconstruction with L ALT on 09/27/2021 by Dr. Conley Arellano and Dr. Amada Arellano at [facility].  Pathology showed 4.2 cm SCCa, moderately differentiated centered in the left pyriform sinus of the hypopharynx with patient into the thyroid cartilage, PNI present, margins negative.  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 1/12 lymph node positive for  metastatic papillary thyroid cancer measuring 2 cm, ENE (-)        Cancer of pyriform sinus ([place])  03/30/2021 Initial Diagnosis   Cancer of pyriform sinus ([place])   04/18/2021 - 06/06/2021 Chemotherapy   Patient is on Treatment Plan : HEAD/NECK Cisplatin q7d      Cancer Staging   Cancer Staging  No matching staging information was found for the patient.   04/18/2021 Cancer Staging   Staging form: Pharynx - Hypopharynx, AJCC 8th Edition - Clinical: Stage IVA (cT1, cN2c, cM0) - Signed by Travis Sickle, MD on 04/18/2021      HISTORY OF PRESENTING ILLNESS: Appears frail.  Ambulating independently/wheelchair.  Accompanied by his brother.  Travis Arellano 76 y.o.  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 Found to have persistent disease and s/p salvage total laryngectomy, BND (II-IV), left hemi-thyroidectomy with CND, and ALT free flap reconstruction on 09/27/21 for persistent disease, pT4aN0M0. Risk factors included PNI and LVI but did not have adjuvant XRT. Also incidentally found to have pT1aN1b PTC.   Unfortunately PET/CT (01/18/2022) shows large avid lesion at the base of the free flap along with multiple lung/mediastinal lesions concerning for distant disease.  Patient is currently undergoing radiation through [facility], [place].  Patient also evaluated medical oncology at [facility]-recommend systemic chemoimmunotherapy.   Patient reports pain to neck, trach 5/10. Patient reports he was given oxycodone by [facility] but is unsure of dose at this time. Patient has follow up with [facility] on Thursday regarding trach.   Patients family reports patient is feeling weaker than normal.  Poor appetite.  Weight loss.  Patient uses PEG tube for feeding. No nausea or vomiting.  Review of Systems  Constitutional:  Positive for malaise/fatigue and weight loss. Negative for chills, diaphoresis and fever.  HENT:  Negative for nosebleeds and sore throat.   Eyes:   Negative for double vision.  Respiratory:  Negative for cough, hemoptysis, sputum production, shortness of breath and wheezing.   Cardiovascular:  Negative for chest pain, palpitations, orthopnea and leg swelling.  Gastrointestinal:  Negative for abdominal pain, blood in stool, constipation, diarrhea, heartburn, melena, nausea and vomiting.  Genitourinary:  Negative for dysuria, frequency and urgency.  Musculoskeletal:  Positive for back pain and joint pain.  Skin: Negative.  Negative for itching and rash.  Neurological:  Negative for dizziness, tingling, focal weakness, weakness and headaches.  Endo/Heme/Allergies:  Does not bruise/bleed easily.  Psychiatric/Behavioral:  Negative for depression. The patient is not nervous/anxious and does not have insomnia.      MEDICAL HISTORY:  Past Medical History:  Diagnosis Date   Cancer of pyriform sinus (HCC)    High cholesterol    Hypertension    Wears dentures    Full upper, partial lower    SURGICAL HISTORY: Past Surgical History:  Procedure Laterality Date   CATARACT EXTRACTION W/PHACO Right 05/17/2020   Procedure: CATARACT EXTRACTION PHACO AND INTRAOCULAR LENS PLACEMENT (IOC);  Surgeon: Baruch Goldmann, MD;  Location: [facility];  Service: Ophthalmology;  Laterality: Right;  CDE: 15.03   CATARACT EXTRACTION W/PHACO Left 06/11/2020   Procedure: CATARACT EXTRACTION PHACO AND INTRAOCULAR LENS PLACEMENT (IOC);  Surgeon: Baruch Goldmann, MD;  Location: [facility];  Service: Ophthalmology;  Laterality: Left;  CDE: 13.98   COLONOSCOPY     IR GASTROSTOMY TUBE MOD SED  05/20/2021   IR IMAGING GUIDED PORT INSERTION  04/05/2021   IR REPLC GASTRO/COLONIC TUBE PERCUT W/FLUORO  11/18/2021   MICROLARYNGOSCOPY N/A 03/22/2021   Procedure: MICROSCOPIC DIRECT LARYNGOSCOPY WITH BIOPSY;  Surgeon: Travis Canterbury, MD;  Location: [facility];  Service: ENT;  Laterality: N/A;   RIGID ESOPHAGOSCOPY N/A 03/22/2021   Procedure: RIGID ESOPHAGOSCOPY;  Surgeon: Travis Canterbury,  MD;  Location: [facility];  Service: ENT;  Laterality: N/A;    SOCIAL HISTORY: Social History   Socioeconomic History   Marital status: Divorced    Spouse name: Not on file   Number of children: Not on file   Years of education: Not on file   Highest education level: Not on file  Occupational History   Not on file  Tobacco Use   Smoking status: Former    Types: Cigarettes    Quit date: 1999    Years since quitting: 25.0   Smokeless tobacco: Never  Vaping Use   Vaping Use: Never used  Substance and Sexual Activity   Alcohol use: No   Drug use: No   Sexual activity: Not on file  Other Topics Concern   Not on file  Social History Narrative   Lives between [place] [[address]]; with son and brother. Quit smoking in 1999 [prior all life]; quit alcohol 30 years- used to drink beer. Retd/ Part time- auto-mechanic work.    Social Determinants of Health   Financial Resource Strain: Not on file  Food Insecurity: Not on file  Transportation Needs: Not on file  Physical Activity: Not on file  Stress: Not on file  Social Connections: Not on file  Intimate Partner Violence: Not on file    FAMILY HISTORY: Family History  Problem Relation Age of Onset   Congestive Heart Failure Mother    Diabetes Father    Lung cancer Brother     ALLERGIES:  has No Known Allergies.  MEDICATIONS:  Current Outpatient  Medications  Medication Sig Dispense Refill   levothyroxine (SYNTHROID) 100 MCG tablet Take 100 mcg by mouth daily.     terazosin (HYTRIN) 5 MG capsule Take 5 mg by mouth at bedtime.     chlorhexidine (PERIDEX) 0.12 % solution SMARTSIG:By Mouth (Patient not taking: Reported on 12/01/2021)     doxycycline (VIBRAMYCIN) 100 MG capsule Take 1 capsule (100 mg total) by mouth 2 (two) times daily. (Patient not taking: Reported on 12/01/2021) 20 capsule 0   HYDROcodone-acetaminophen (HYCET) 7.5-325 mg/15 ml solution Place 10 mLs into feeding tube every 6 (six)  hours as needed. 120 mL 0   Nutritional Supplements (ISOSOURCE 1.5 CAL) LIQD Give 2 cartons at 8am. Give 1 carton at 11am, 2pm, 5pm and 8pm. Flush with 120 ml of water before and after each feeding. Send bolus tube feeding supplies.  [place] RN starting on 05/23/21 to provide support for TF management in the home.  Start with 2 cartons daily and then add 1 carton each day until at goal rate. (Patient not taking: Reported on 12/01/2021) 1659 mL 0   polyethylene glycol powder (MIRALAX) 17 GM/SCOOP powder Take 2 cap fulls 1-2 times daily as needed. (Patient not taking: Reported on 12/01/2021) 255 g 2   senna (SENOKOT) 8.6 MG TABS tablet Take 2 tablets 1-2 times daily. (Patient not taking: Reported on 12/01/2021) 120 tablet 2   No current facility-administered medications for this visit.   Facility-Administered Medications Ordered in Other Visits  Medication Dose Route Frequency Provider Last Rate Last Admin   heparin lock flush 100 UNIT/ML injection              PHYSICAL EXAMINATION: ECOG PERFORMANCE STATUS: 1 - Symptomatic but completely ambulatory  Vitals:   03/07/22 1042  BP: 100/63  Pulse: 72  Resp: 18  Temp: (!) 97 F (36.1 C)  SpO2: 97%   There were no vitals filed for this visit.  Positive for trach.  Nodular lesion noted left chest wall/clavicle region.  Physical Exam Vitals and nursing Arellano reviewed.  HENT:     Head: Normocephalic and atraumatic.     Mouth/Throat:     Pharynx: Oropharynx is clear.  Eyes:     Extraocular Movements: Extraocular movements intact.     Pupils: Pupils are equal, round, and reactive to light.  Cardiovascular:     Rate and Rhythm: Normal rate and regular rhythm.  Pulmonary:     Comments: Decreased breath sounds bilaterally.  Abdominal:     Palpations: Abdomen is soft.  Musculoskeletal:        General: Normal range of motion.     Cervical back: Normal range of motion.  Skin:    General: Skin is warm.  Neurological:     General: No focal deficit  present.     Mental Status: He is alert and oriented to person, place, and time.  Psychiatric:        Behavior: Behavior normal.        Judgment: Judgment normal.      LABORATORY DATA:  I have reviewed the data as listed Lab Results  Component Value Date   WBC 7.1 03/07/2022   HGB 9.9 (L) 03/07/2022   HCT 30.7 (L) 03/07/2022   MCV 85.5 03/07/2022   PLT 263 03/07/2022   Recent Labs    08/25/21 0917 09/07/21 1610 12/01/21 0938 03/07/22 0926  NA 138 138 139 135  K 3.9 3.9 3.7 4.0  CL 101 99 108 102  CO2 29 28 25  28  GLUCOSE 173* 102* 117* 142*  BUN 25* 26* 12 37*  CREATININE 1.05 0.95 1.03 1.24  CALCIUM 9.1 9.1 8.7* 10.3  GFRNONAA >60 >60 >60 60*  PROT 6.9  --  6.8 7.5  ALBUMIN 3.5  --  3.2* 2.7*  AST 20  --  18 28  ALT 15  --  12 46*  ALKPHOS 68  --  62 61  BILITOT 0.3  --  0.7 0.6    RADIOGRAPHIC STUDIES: I have personally reviewed the radiological images as listed and agreed with the findings in the report. No results found.  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 Found to have persistent disease and s/p salvage total laryngectomy, BND (II-IV), left hemi-thyroidectomy with CND, and ALT free flap reconstruction on 09/27/21 for persistent disease, pT4aN0M0. Risk factors included PNI and LVI but did not have adjuvant XRT.   Unfortunately PET/CT ([facility]-01/18/2022) shows large avid lesion at the base of the free flap along with multiple lung/mediastinal lesions concerning for distant disease.  Patient currently undergoing palliative radiation with [facility] to the base of the free flap lesion.   # I had a long discussion the patient regarding further treatment options including chemotherapy-immunotherapy vs single agent immunotherapy.  I am concerned about patient's tolerance to chemoimmunotherapy given significant decline in performance status overall feeling poorly.    Patient  is currently undergoing radiation through [facility], [place].  Patient also evaluated medical oncology at [facility]-recommend systemic chemoimmunotherapy.  Concerned about patient's tolerance to chemoimmunotherapy-I would recommend single agent pembrolizumab [cognizant of CPS 1]-which might lead to low responses blood pressure tolerance.  The patient tolerance improves then consider adding CarboTaxol.  I discussed the mechanism of action; The goal of therapy is palliative; and length of treatments are likely ongoing/based upon the results of the scans. Discussed the potential side effects of immunotherapy including but not limited to diarrhea; skin rash; elevated LFTs/endocrine abnormalities etc.  # Also discussed the median survival with treatment is approximate 12 months.  However with the treatment patient's life expectancy less than 6 months.  Patient interested in proceeding with treatment.   #Nutrition-s/p PEG tube placement continue tube feeds; possible weight loss.  Recommend follow-up with nutrition.  Referral made.  # Pain control-secondary to malignancy recommend hydrocodone liquid new prescription sent.  # IV access/Mediport- port flush today  # DISPOSITION: # refer to [place] re; nutrition # Follow up TBD-Dr.B  # 40 minutes face-to-face with the patient discussing the above plan of care; more than 50% of time spent on prognosis/ natural history; counseling and coordination.          All questions were answered. The patient knows to call the clinic with any problems, questions or concerns.       Travis Sickle, MD 03/08/2022 1:14 PM

## 2022-03-08 ENCOUNTER — Encounter: Payer: Self-pay | Admitting: Internal Medicine

## 2022-03-08 NOTE — Progress Notes (Signed)
DISCONTINUE ON PATHWAY REGIMEN - Head and Neck     A cycle is every 21 days:     Carboplatin      Fluorouracil      Pembrolizumab   **Always confirm dose/schedule in your pharmacy ordering system**  REASON: Other Reason PRIOR TREATMENT: [id number]: Pembrolizumab 200 mg D1 + Carboplatin AUC=5 D1 + Fluorouracil 1,000 mg/m2/day CIV D1-4 q21 Days x 6 Cycles, then Pembrolizumab 200 mg q21 Days for up to a Total of 24 Months TREATMENT RESPONSE: Unable to Evaluate  START ON PATHWAY REGIMEN - Head and Neck     A cycle is every 21 days:     Pembrolizumab   **Always confirm dose/schedule in your pharmacy ordering system**  Patient Characteristics: Hypopharynx, Metastatic, First Line, Prior Platinum-Based Chemoradiation within 6 Months, Candidate for Checkpoint Inhibitor Disease Classification: Hypopharynx Therapeutic Status: Metastatic Disease Line of Therapy: First Line Prior Platinum Status: Prior Platinum-Based Chemoradiation within 6 Months Checkpoint Inhibitor Candidacy: Candidate for Checkpoint Inhibitor Intent of Therapy: Non-Curative / Palliative Intent, Discussed with Patient

## 2022-03-09 ENCOUNTER — Encounter: Payer: Self-pay | Admitting: Internal Medicine

## 2022-03-09 ENCOUNTER — Other Ambulatory Visit: Payer: Self-pay

## 2022-03-16 ENCOUNTER — Encounter: Payer: Self-pay | Admitting: Internal Medicine

## 2022-03-16 ENCOUNTER — Inpatient Hospital Stay: Payer: Medicare Other | Attending: Internal Medicine

## 2022-03-16 ENCOUNTER — Telehealth: Payer: Self-pay | Admitting: Internal Medicine

## 2022-03-16 NOTE — Telephone Encounter (Signed)
Patient states he doesn't feel like coming to appointment and will not be coming back. He maybe would like a call in a couple of weeks to see if he wants to come again.   I have cancelled all appointments

## 2022-03-16 NOTE — Progress Notes (Signed)
I called the brother to check on patient.  Patient decided not to proceed with any treatments, planning hospice.  I think it is reasonable given patient's poor performance status/preference.  GB

## 2022-03-16 NOTE — Progress Notes (Signed)
Nutrition  Patient did not show up for scheduled nutrition appointment today.  Sent message to scheduling to offer another appointment and provider.  Jerimiah Wolman [name], [place], [place] Registered Dietitian [phone]

## 2022-03-17 NOTE — Telephone Encounter (Signed)
Dr. B spoke to patient's brother.

## 2022-03-21 ENCOUNTER — Ambulatory Visit: Payer: Medicare Other

## 2022-03-21 ENCOUNTER — Other Ambulatory Visit: Payer: Medicare Other

## 2022-03-21 ENCOUNTER — Ambulatory Visit: Payer: Medicare Other | Admitting: Internal Medicine

## 2022-03-21 ENCOUNTER — Encounter: Payer: Medicare Other | Admitting: Hospice and Palliative Medicine

## 2022-03-25 ENCOUNTER — Other Ambulatory Visit: Payer: Self-pay

## 2022-04-05 ENCOUNTER — Other Ambulatory Visit: Payer: Medicare Other

## 2022-04-05 ENCOUNTER — Ambulatory Visit: Payer: Medicare Other | Admitting: Internal Medicine

## 2022-04-13 ENCOUNTER — Encounter: Payer: Self-pay | Admitting: Internal Medicine

## 2022-04-13 DEATH — deceased

## 2022-04-20 ENCOUNTER — Ambulatory Visit
Admission: RE | Admit: 2022-04-20 | Discharge: 2022-04-20 | Disposition: A | Discharge status: Home / Self Care | Payer: Medicaid Other | Source: Ambulatory Visit | Attending: Interventional Radiology | Admitting: Interventional Radiology

## 2022-05-11 ENCOUNTER — Ambulatory Visit: Payer: Medicaid Other | Admitting: Radiation Oncology

## 2022-06-01 ENCOUNTER — Other Ambulatory Visit: Payer: Medicare Other

## 2022-06-01 ENCOUNTER — Ambulatory Visit: Payer: Medicare Other | Admitting: Internal Medicine

## 2022-09-22 IMAGING — MR MR HEAD WO/W CM
15 series · 48 of 48 positions shown · IV contrast (gadavist)
Comparison: PET-CT 08/18/2021.

CLINICAL DATA: Provided history: Cancer of pyriform sinus.
Bilateral headaches. Headache, new or worsening. Headaches,
dizziness, cancer.

EXAM:
MRI HEAD WITHOUT AND WITH CONTRAST
TECHNIQUE: Multiplanar, multiecho pulse sequences of the brain and surrounding
structures were obtained without and with intravenous contrast.
CONTRAST:  7mL GADAVIST GADOBUTROL 1 MMOL/ML IV SOLN

[Series 5: ax dwi_tracew · axial · 3.0mm · 0.65mm/px · z∈[-57,+96]mm · 3 of 48 slices shown]
[im 1/48]
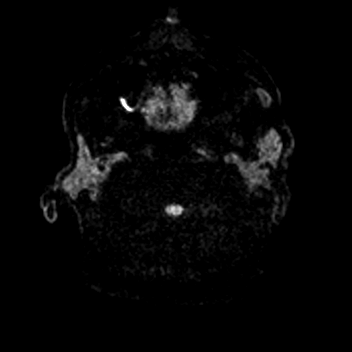
[im 24/48]
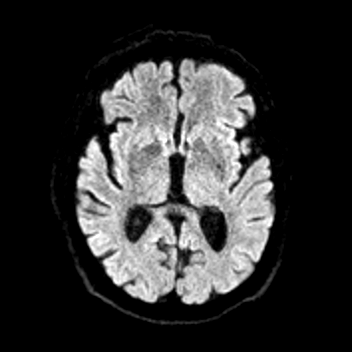
[im 48/48]
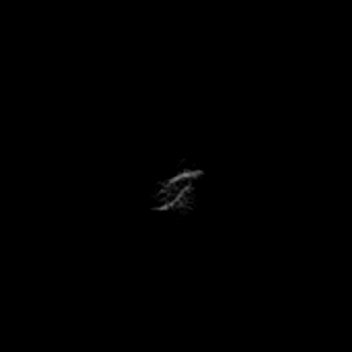

[Series 6: ax dwi_adc · axial · 3.0mm · 0.65mm/px · z∈[-57,+96]mm · 3 of 48 slices shown]
[im 1/48]
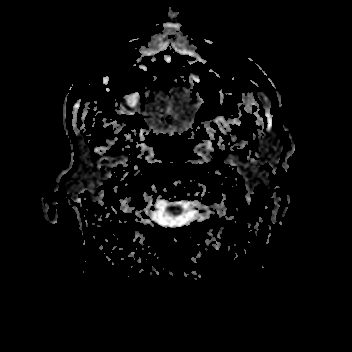
[im 24/48]
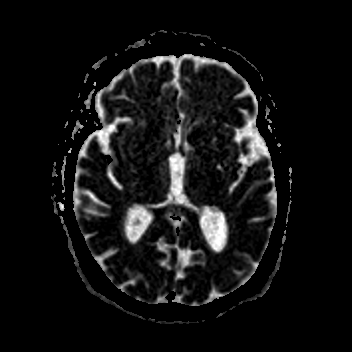
[im 48/48]
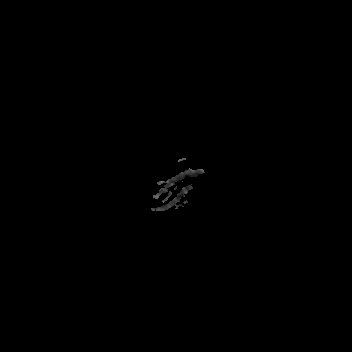

[Series 7: cor dwi_tracew · coronal · 5.0mm · 0.68mm/px · 2 of 40 slices shown]
[im 1/40]
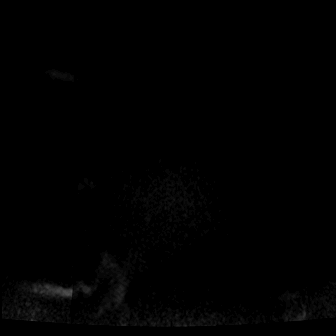
[im 40/40]
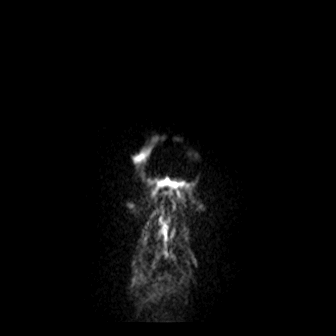

[Series 8: cor dwi_adc · coronal · 5.0mm · 0.68mm/px · 2 of 39 slices shown]
[im 1/39]
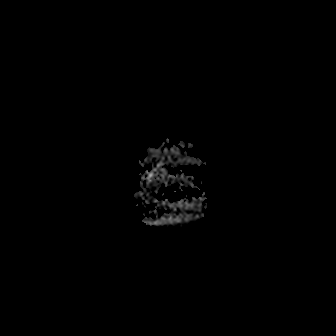
[im 39/39]
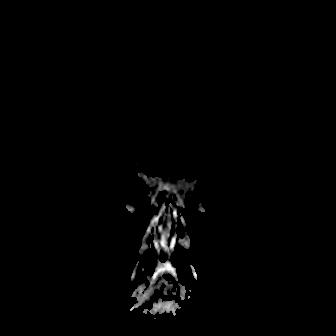

[Series 9: T1 · sagittal · 5.0mm · 0.62mm/px · 1 of 25 slices shown (1 of 2)]
[im 1/25]
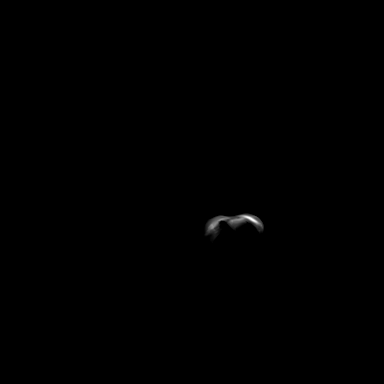

[Series 10: T2 · axial · 5.0mm · 0.53mm/px · 1 of 27 slices shown]
[im 1/27]
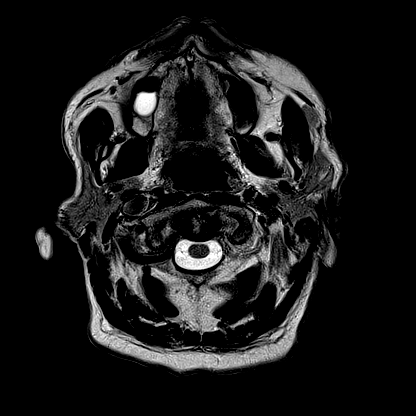

[Series 11: ax swi_mag · axial · 2.0mm · 0.90mm/px · z∈[-58,+97]mm · 4 of 80 slices shown]
[im 1/80]
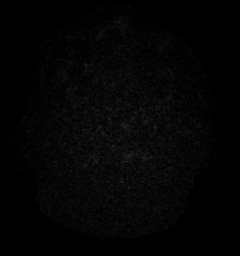
[im 27/80]
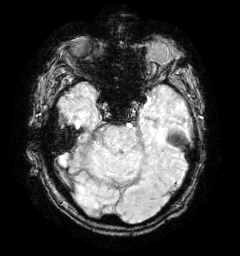
[im 53/80]
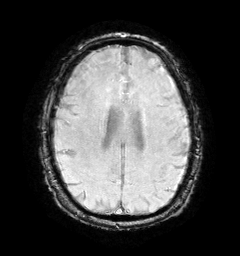
[im 80/80]
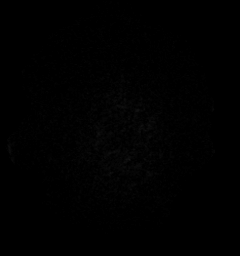

[Series 12: ax swi_pha · axial · 2.0mm · 0.90mm/px · z∈[-58,+97]mm · 4 of 80 slices shown]
[im 1/80]
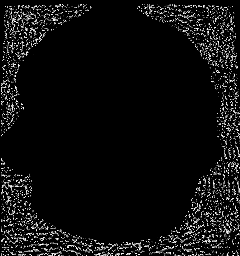
[im 27/80]
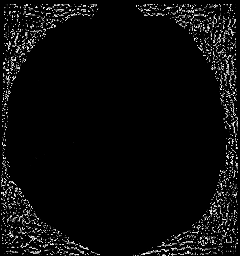
[im 53/80]
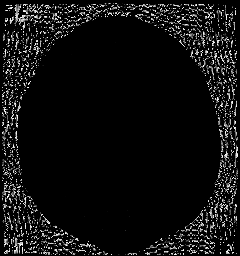
[im 80/80]
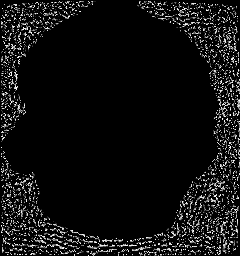

[Series 13: ax swi_swi · axial · 2.0mm · 0.90mm/px · z∈[-58,+97]mm · 4 of 80 slices shown]
[im 1/80]
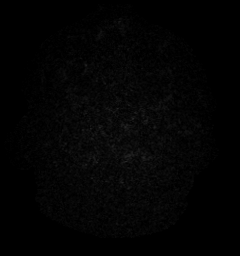
[im 27/80]
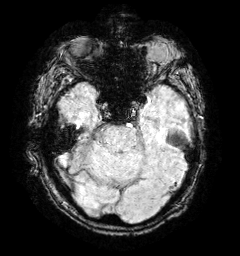
[im 53/80]
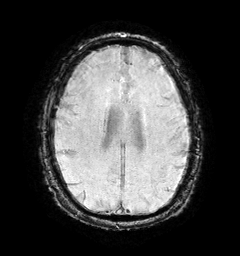
[im 80/80]
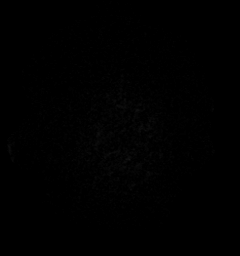

[Series 15: FLAIR · axial · 3.0mm · 0.53mm/px · z∈[-61,+98]mm · 3 of 55 slices shown]
[im 1/55]
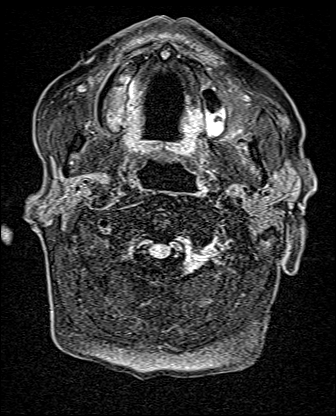
[im 28/55]
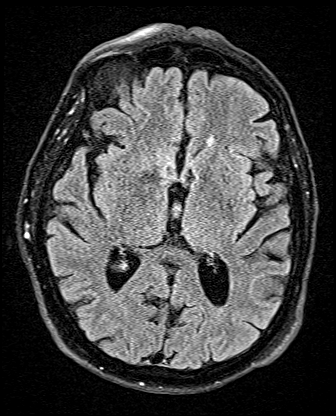
[im 55/55]
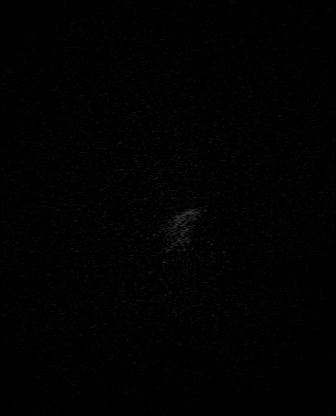

[Series 16: T1 · axial · 1.0mm · 0.98mm/px · z∈[-65,+107]mm · 9 of 173 slices shown (2 of 2)]
[im 1/173]
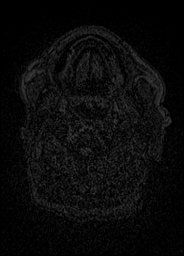
[im 22/173]
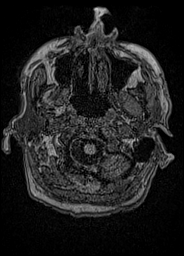
[im 44/173]
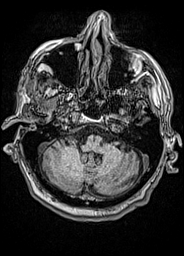
[im 65/173]
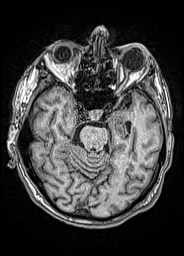
[im 87/173]
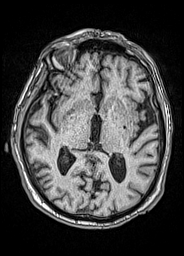
[im 108/173]
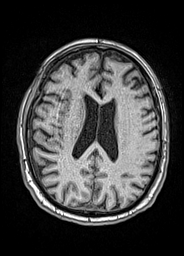
[im 130/173]
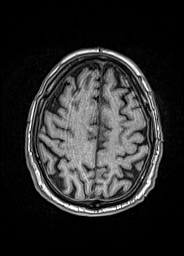
[im 151/173]
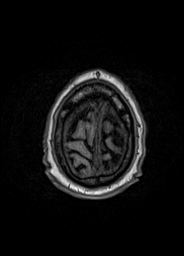
[im 173/173]
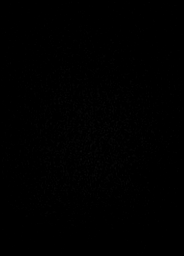

[Series 17: T2 post-contrast · coronal · 5.0mm · 0.57mm/px · 1 of 29 slices shown]
[im 1/29]
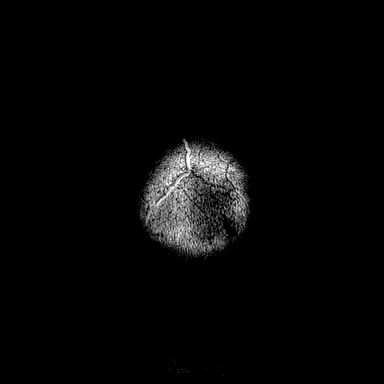

[Series 18: T1 post-contrast · axial · 1.0mm · 0.98mm/px · z∈[-65,+107]mm · 9 of 175 slices shown (1 of 3)]
[im 1/175]
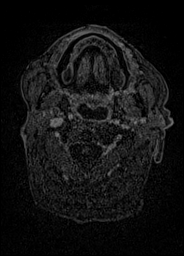
[im 22/175]
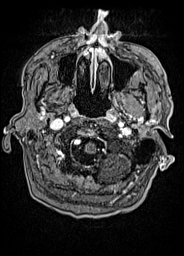
[im 44/175]
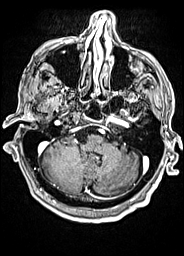
[im 66/175]
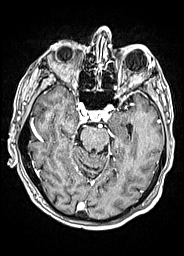
[im 88/175]
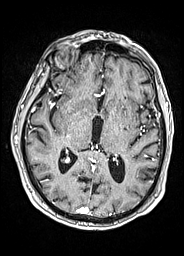
[im 109/175]
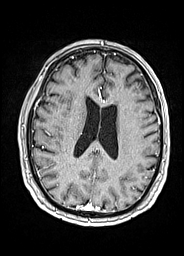
[im 131/175]
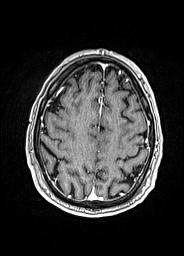
[im 153/175]
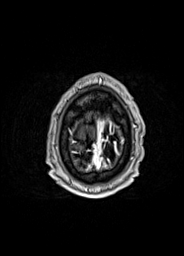
[im 175/175]
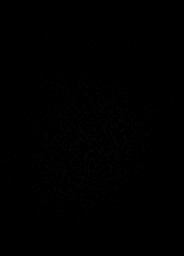

[Series 19: T1 post-contrast · coronal · 5.0mm · 0.57mm/px · 1 of 29 slices shown (2 of 3)]
[im 1/29]
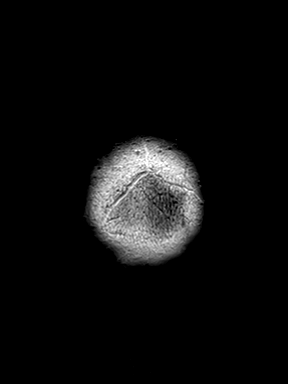

[Series 20: T1 post-contrast · sagittal · 5.0mm · 0.62mm/px · 1 of 25 slices shown (3 of 3)]
[im 1/25]
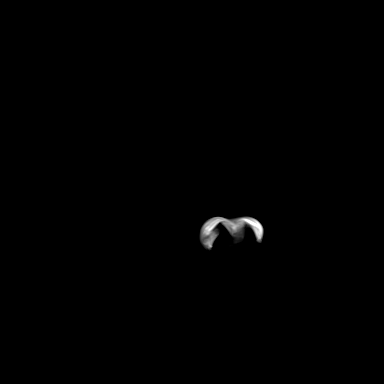

[48 of 48 positions shown; findings below may reference images not displayed]

FINDINGS: Mild intermittent motion degradation.

Brain:

Mild generalized cerebral atrophy.

Multifocal T2 FLAIR hyperintense signal abnormality within the
cerebral white matter, nonspecific but compatible with mild chronic
small vessel ischemic disease.

Subcentimeter focus of susceptibility-weighted signal loss within
the left temporal lobe which may reflect a chronic microhemorrhage
or focus of mineralization.

There is no acute infarct.

No evidence of an intracranial mass.

No extra-axial fluid collection.

No midline shift.

Vascular: Maintained flow voids within the proximal large arterial
vessels. Small developmental venous anomaly within the anteromedial
right frontal lobe (anatomic variant).

Skull and upper cervical spine: No suspicious marrow lesion.

Sinuses/Orbits: No mass or acute finding within the imaged orbits.
Prior bilateral ocular lens replacement. 13 mm mucous retention cyst
within the inferior right maxillary sinus. Small mucous retention
cysts within the right sphenoid sinus. Tiny mucous retention cysts
within the left maxillary sinus. Mild mucosal thickening within the
bilateral ethmoid sinuses. Small mucous retention cyst within a
right ethmoid air cell.
IMPRESSION: 1. Mildly motion degraded exam.
2. No evidence of acute intracranial abnormality.
3. No evidence of intracranial metastatic disease.
4. Mild chronic small vessel ischemic changes within the cerebral
white matter.
5. Mild generalized cerebral atrophy.
6. Paranasal sinus disease, as described.
